# Patient Record
Sex: Female | Born: 1960 | Race: White | Hispanic: No | Marital: Single | Smoking: Never smoker
Health system: Southern US, Community
[De-identification: ages and names within clinical notes are randomized; demographics above are authoritative.]

## PROBLEM LIST (undated history)

## (undated) DIAGNOSIS — F319 Bipolar disorder, unspecified: Secondary | ICD-10-CM

## (undated) DIAGNOSIS — Z87442 Personal history of urinary calculi: Secondary | ICD-10-CM

## (undated) DIAGNOSIS — M549 Dorsalgia, unspecified: Secondary | ICD-10-CM

## (undated) DIAGNOSIS — F329 Major depressive disorder, single episode, unspecified: Secondary | ICD-10-CM

## (undated) DIAGNOSIS — T8859XA Other complications of anesthesia, initial encounter: Secondary | ICD-10-CM

## (undated) DIAGNOSIS — F419 Anxiety disorder, unspecified: Secondary | ICD-10-CM

## (undated) DIAGNOSIS — E7800 Pure hypercholesterolemia, unspecified: Secondary | ICD-10-CM

## (undated) DIAGNOSIS — F410 Panic disorder [episodic paroxysmal anxiety] without agoraphobia: Secondary | ICD-10-CM

## (undated) DIAGNOSIS — G479 Sleep disorder, unspecified: Secondary | ICD-10-CM

## (undated) DIAGNOSIS — J45909 Unspecified asthma, uncomplicated: Secondary | ICD-10-CM

## (undated) DIAGNOSIS — E739 Lactose intolerance, unspecified: Secondary | ICD-10-CM

## (undated) DIAGNOSIS — F32A Depression, unspecified: Secondary | ICD-10-CM

## (undated) DIAGNOSIS — I773 Arterial fibromuscular dysplasia: Secondary | ICD-10-CM

## (undated) DIAGNOSIS — R112 Nausea with vomiting, unspecified: Secondary | ICD-10-CM

## (undated) DIAGNOSIS — T4145XA Adverse effect of unspecified anesthetic, initial encounter: Secondary | ICD-10-CM

## (undated) DIAGNOSIS — M2550 Pain in unspecified joint: Secondary | ICD-10-CM

## (undated) DIAGNOSIS — Z9889 Other specified postprocedural states: Secondary | ICD-10-CM

## (undated) DIAGNOSIS — F909 Attention-deficit hyperactivity disorder, unspecified type: Secondary | ICD-10-CM

## (undated) HISTORY — DX: Pain in unspecified joint: M25.50

## (undated) HISTORY — DX: Anxiety disorder, unspecified: F41.9

## (undated) HISTORY — DX: Lactose intolerance, unspecified: E73.9

## (undated) HISTORY — DX: Pure hypercholesterolemia, unspecified: E78.00

## (undated) HISTORY — PX: LIPOSUCTION: SHX10

## (undated) HISTORY — DX: Dorsalgia, unspecified: M54.9

## (undated) HISTORY — PX: WISDOM TOOTH EXTRACTION: SHX21

---

## 2004-09-22 ENCOUNTER — Other Ambulatory Visit: Admission: RE | Admit: 2004-09-22 | Discharge: 2004-09-22 | Payer: Self-pay | Admitting: Obstetrics and Gynecology

## 2004-10-18 ENCOUNTER — Encounter: Admission: RE | Admit: 2004-10-18 | Discharge: 2004-10-18 | Payer: Self-pay | Admitting: Obstetrics and Gynecology

## 2013-04-25 DIAGNOSIS — G43909 Migraine, unspecified, not intractable, without status migrainosus: Secondary | ICD-10-CM | POA: Insufficient documentation

## 2013-04-25 DIAGNOSIS — G4730 Sleep apnea, unspecified: Secondary | ICD-10-CM | POA: Insufficient documentation

## 2013-07-09 DIAGNOSIS — F419 Anxiety disorder, unspecified: Secondary | ICD-10-CM | POA: Insufficient documentation

## 2013-09-22 ENCOUNTER — Ambulatory Visit (INDEPENDENT_AMBULATORY_CARE_PROVIDER_SITE_OTHER): Payer: Managed Care, Other (non HMO) | Admitting: Licensed Clinical Social Worker

## 2013-09-22 DIAGNOSIS — F331 Major depressive disorder, recurrent, moderate: Secondary | ICD-10-CM

## 2013-10-01 ENCOUNTER — Ambulatory Visit (INDEPENDENT_AMBULATORY_CARE_PROVIDER_SITE_OTHER): Payer: Managed Care, Other (non HMO) | Admitting: Licensed Clinical Social Worker

## 2013-10-01 DIAGNOSIS — F331 Major depressive disorder, recurrent, moderate: Secondary | ICD-10-CM

## 2013-10-08 ENCOUNTER — Ambulatory Visit (INDEPENDENT_AMBULATORY_CARE_PROVIDER_SITE_OTHER): Payer: Managed Care, Other (non HMO) | Admitting: Licensed Clinical Social Worker

## 2013-10-08 DIAGNOSIS — F331 Major depressive disorder, recurrent, moderate: Secondary | ICD-10-CM

## 2013-10-20 ENCOUNTER — Ambulatory Visit (INDEPENDENT_AMBULATORY_CARE_PROVIDER_SITE_OTHER): Payer: Managed Care, Other (non HMO) | Admitting: Licensed Clinical Social Worker

## 2013-10-20 DIAGNOSIS — F331 Major depressive disorder, recurrent, moderate: Secondary | ICD-10-CM

## 2013-11-03 ENCOUNTER — Ambulatory Visit (INDEPENDENT_AMBULATORY_CARE_PROVIDER_SITE_OTHER): Payer: Managed Care, Other (non HMO) | Admitting: Licensed Clinical Social Worker

## 2013-11-03 DIAGNOSIS — F331 Major depressive disorder, recurrent, moderate: Secondary | ICD-10-CM

## 2013-11-26 ENCOUNTER — Ambulatory Visit: Payer: Managed Care, Other (non HMO) | Admitting: Licensed Clinical Social Worker

## 2013-12-01 ENCOUNTER — Emergency Department (HOSPITAL_BASED_OUTPATIENT_CLINIC_OR_DEPARTMENT_OTHER): Payer: Managed Care, Other (non HMO)

## 2013-12-01 ENCOUNTER — Encounter (HOSPITAL_BASED_OUTPATIENT_CLINIC_OR_DEPARTMENT_OTHER): Payer: Self-pay | Admitting: Emergency Medicine

## 2013-12-01 ENCOUNTER — Emergency Department (HOSPITAL_BASED_OUTPATIENT_CLINIC_OR_DEPARTMENT_OTHER)
Admission: EM | Admit: 2013-12-01 | Discharge: 2013-12-01 | Disposition: A | Discharge status: Home / Self Care | Payer: Managed Care, Other (non HMO) | Attending: Emergency Medicine | Admitting: Emergency Medicine

## 2013-12-01 DIAGNOSIS — R29818 Other symptoms and signs involving the nervous system: Secondary | ICD-10-CM | POA: Insufficient documentation

## 2013-12-01 DIAGNOSIS — F419 Anxiety disorder, unspecified: Secondary | ICD-10-CM

## 2013-12-01 DIAGNOSIS — N390 Urinary tract infection, site not specified: Secondary | ICD-10-CM | POA: Insufficient documentation

## 2013-12-01 DIAGNOSIS — R0789 Other chest pain: Secondary | ICD-10-CM | POA: Insufficient documentation

## 2013-12-01 DIAGNOSIS — F411 Generalized anxiety disorder: Secondary | ICD-10-CM | POA: Insufficient documentation

## 2013-12-01 HISTORY — DX: Attention-deficit hyperactivity disorder, unspecified type: F90.9

## 2013-12-01 LAB — CBC WITH DIFFERENTIAL/PLATELET
Basophils Absolute: 0 10*3/uL (ref 0.0–0.1)
Basophils Relative: 1 % (ref 0–1)
Eosinophils Absolute: 0.2 10*3/uL (ref 0.0–0.7)
Eosinophils Relative: 3 % (ref 0–5)
HCT: 41.6 % (ref 36.0–46.0)
Hemoglobin: 13.7 g/dL (ref 12.0–15.0)
Lymphocytes Relative: 23 % (ref 12–46)
Lymphs Abs: 1.4 10*3/uL (ref 0.7–4.0)
MCH: 29.9 pg (ref 26.0–34.0)
MCHC: 32.9 g/dL (ref 30.0–36.0)
MCV: 90.8 fL (ref 78.0–100.0)
Monocytes Absolute: 0.6 10*3/uL (ref 0.1–1.0)
Monocytes Relative: 10 % (ref 3–12)
Neutro Abs: 3.9 10*3/uL (ref 1.7–7.7)
Neutrophils Relative %: 64 % (ref 43–77)
Platelets: 369 10*3/uL (ref 150–400)
RBC: 4.58 MIL/uL (ref 3.87–5.11)
RDW: 13.1 % (ref 11.5–15.5)
WBC: 6.2 10*3/uL (ref 4.0–10.5)

## 2013-12-01 LAB — COMPREHENSIVE METABOLIC PANEL
ALT: 24 U/L (ref 0–35)
AST: 30 U/L (ref 0–37)
Albumin: 4.3 g/dL (ref 3.5–5.2)
Alkaline Phosphatase: 116 U/L (ref 39–117)
BUN: 9 mg/dL (ref 6–23)
CO2: 29 mEq/L (ref 19–32)
Calcium: 9.7 mg/dL (ref 8.4–10.5)
Chloride: 103 mEq/L (ref 96–112)
Creatinine, Ser: 0.7 mg/dL (ref 0.50–1.10)
GFR calc Af Amer: >90 mL/min (ref >90)
GFR calc non Af Amer: >90 mL/min (ref >90)
Glucose, Bld: 98 mg/dL (ref 70–99)
Potassium: 4.6 mEq/L (ref 3.7–5.3)
Sodium: 142 mEq/L (ref 137–147)
Total Bilirubin: 0.4 mg/dL (ref 0.3–1.2)
Total Protein: 7.9 g/dL (ref 6.0–8.3)

## 2013-12-01 LAB — URINE MICROSCOPIC-ADD ON

## 2013-12-01 LAB — URINALYSIS, ROUTINE W REFLEX MICROSCOPIC
Bilirubin Urine: NEGATIVE
Glucose, UA: NEGATIVE mg/dL
Hgb urine dipstick: NEGATIVE
Ketones, ur: NEGATIVE mg/dL
Nitrite: NEGATIVE
Protein, ur: NEGATIVE mg/dL
Specific Gravity, Urine: 1.013 (ref 1.005–1.030)
Urobilinogen, UA: 0.2 mg/dL (ref 0.0–1.0)
pH: 7.5 (ref 5.0–8.0)

## 2013-12-01 LAB — TROPONIN I
Troponin I: <0.3 ng/mL (ref <0.30)
Troponin I: <0.3 ng/mL (ref <0.30)

## 2013-12-01 MED ORDER — GI COCKTAIL ~~LOC~~
30.0000 mL | Freq: Once | ORAL | Status: AC
  Administered 2013-12-01: 30 mL via ORAL
  Filled 2013-12-01: qty 30

## 2013-12-01 MED ORDER — SODIUM CHLORIDE 0.9 % IV BOLUS (SEPSIS)
500.0000 mL | Freq: Once | INTRAVENOUS | Status: AC
  Administered 2013-12-01: 500 mL via INTRAVENOUS

## 2013-12-01 MED ORDER — NITROFURANTOIN MONOHYD MACRO 100 MG PO CAPS: 100.0000 mg | ORAL_CAPSULE | Freq: Two times a day (BID) | ORAL | Status: DC

## 2013-12-01 NOTE — ED Notes (Signed)
Per EMS pt just got to work and felt a tightness to lower chest (bra strap area), no SOB, pt was hyperventilating, anxious; pt states felt like she was going to pass out; cbg 88, pt in no distress, denies chest pain, NSS, EKG NSR

## 2013-12-01 NOTE — ED Provider Notes (Signed)
CSN: [id number]     Arrival date & time 12/01/13  0459 History   First MD Initiated Contact with Patient 12/01/13 [phone]     Chief Complaint  Patient presents with  . Near Syncope   (Consider location/radiation/quality/duration/timing/severity/associated sxs/prior Treatment) Patient is a 53 y.o. female presenting with chest pain. The history is provided by the patient. No language interpreter was used.  Chest Pain Chest pain location: the entire chest anterior and posterior circumferentially, equal. Pain quality: tightness   Pain radiates to:  Does not radiate Pain radiates to the back: no   Pain severity:  Mild Onset quality:  Sudden Timing:  Constant Progression:  Unchanged Context comment:  Getting readying for work Relieved by:  Nothing Worsened by:  Nothing tried Ineffective treatments:  None tried Associated symptoms: no cough, no fever, no shortness of breath, no syncope, not vomiting and no weakness   Associated symptoms comment:  Dry mouth sweaty palms tightness circumferentially around head.  Hyperventilating as witnessed by EMS Risk factors: no birth control, no smoking and no surgery   No long car trips or plane trips no swelling or pain in the lower extremities.  States she has not had a panic attack since [place]it[place] Egle.    Past Medical History  Diagnosis Date  . Anxiety attack   . ADHD (attention deficit hyperactivity disorder)    Past Surgical History  Procedure Laterality Date  . Liposuction    . Wisdom tooth extraction     History reviewed. No pertinent family history. History  Substance Use Topics  . Smoking status: Never Smoker   . Smokeless tobacco: Not on file  . Alcohol Use: No   OB History   Grav Para Term Preterm Abortions TAB SAB Ect Mult Living                 Review of Systems  Constitutional: Negative for fever.  Respiratory: Positive for chest tightness. Negative for cough and shortness of breath.   Cardiovascular: Negative for chest pain and  syncope.  Gastrointestinal: Negative for vomiting.  Neurological: Negative for weakness.  All other systems reviewed and are negative.    Allergies  Review of patient's allergies indicates no known allergies.  Home Medications  No current outpatient prescriptions on file. BP 104/88  Pulse 90  Temp(Src) 97.7 F (36.5 C) (Oral)  Resp 16  Ht 5\' 4"  (1.626 m)  Wt 170 lb (77.111 kg)  BMI 29.17 kg/m2  SpO2 97% Physical Exam  Constitutional: She is oriented to person, place, and time. She appears well-developed and well-nourished.  HENT:  Head: Normocephalic and atraumatic.  Mouth/Throat: Oropharynx is clear and moist.  Eyes: Conjunctivae are normal. Pupils are equal, round, and reactive to light.  Neck: Normal range of motion. Neck supple.  Cardiovascular: Normal rate, regular rhythm and intact distal pulses.   Pulmonary/Chest: Effort normal and breath sounds normal. No respiratory distress. She has no wheezes. She has no rales. She exhibits no tenderness.  Abdominal: Soft. Bowel sounds are normal. There is no tenderness. There is no rebound and no guarding.  Musculoskeletal: Normal range of motion.  Neurological: She is alert and oriented to person, place, and time.  Skin: Skin is warm and dry. She is not diaphoretic.  Psychiatric: Her mood appears anxious.  Wringing hands    ED Course  Procedures (including critical care time) Labs Review Labs Reviewed  CBC WITH DIFFERENTIAL  TROPONIN I  COMPREHENSIVE METABOLIC PANEL  URINALYSIS, ROUTINE W REFLEX MICROSCOPIC   Imaging  Review No results found.  EKG Interpretation   None      Doubt PE no travel no swelling of the legs no surgery no exogenous estrogen MDM  No diagnosis found.  Date: 12/01/2013  Rate: 88  Rhythm: normal sinus rhythm  QRS Axis: normal  Intervals: normal  ST/T Wave abnormalities: normal  Conduction Disutrbances: none  Narrative Interpretation: unremarkable   Will obtain second set of troponins  at 8 am.  Do not think the issue is cardiac.  Symptons are consistent with panic attack.  Patient states she is reassured by the labs but now is concerned she may have shingles because of something the EMT said to her en route.  Follow up with your family doctor for shingles vaccine and ongoing care   Signed out to Dr. Maryan Rued pending troponin  Daeja Helderman K Khyleigh Furney-Rasch, MD 12/01/13 [phone]

## 2013-12-02 LAB — URINE CULTURE: Colony Count: 30000

## 2013-12-08 ENCOUNTER — Ambulatory Visit: Payer: Managed Care, Other (non HMO) | Admitting: Licensed Clinical Social Worker

## 2013-12-31 DIAGNOSIS — F319 Bipolar disorder, unspecified: Secondary | ICD-10-CM | POA: Insufficient documentation

## 2014-06-23 DIAGNOSIS — M8580 Other specified disorders of bone density and structure, unspecified site: Secondary | ICD-10-CM | POA: Insufficient documentation

## 2014-06-23 DIAGNOSIS — E669 Obesity, unspecified: Secondary | ICD-10-CM | POA: Insufficient documentation

## 2014-07-14 DIAGNOSIS — R339 Retention of urine, unspecified: Secondary | ICD-10-CM | POA: Insufficient documentation

## 2014-10-07 ENCOUNTER — Other Ambulatory Visit: Payer: Self-pay | Admitting: Gastroenterology

## 2015-01-06 ENCOUNTER — Encounter (HOSPITAL_COMMUNITY): Payer: Self-pay | Admitting: *Deleted

## 2015-01-06 ENCOUNTER — Other Ambulatory Visit: Payer: Self-pay | Admitting: Gastroenterology

## 2015-01-11 ENCOUNTER — Ambulatory Visit (HOSPITAL_COMMUNITY)
Admission: RE | Admit: 2015-01-11 | Discharge: 2015-01-11 | Disposition: A | Discharge status: Home / Self Care | Payer: Managed Care, Other (non HMO) | Source: Ambulatory Visit | Attending: Gastroenterology | Admitting: Gastroenterology

## 2015-01-11 ENCOUNTER — Encounter (HOSPITAL_COMMUNITY)
Admission: RE | Disposition: A | Discharge status: Home / Self Care | Payer: Self-pay | Source: Ambulatory Visit | Attending: Gastroenterology

## 2015-01-11 ENCOUNTER — Ambulatory Visit (HOSPITAL_COMMUNITY): Payer: Managed Care, Other (non HMO) | Admitting: Anesthesiology

## 2015-01-11 ENCOUNTER — Encounter (HOSPITAL_COMMUNITY): Payer: Self-pay | Admitting: Gastroenterology

## 2015-01-11 DIAGNOSIS — F909 Attention-deficit hyperactivity disorder, unspecified type: Secondary | ICD-10-CM | POA: Insufficient documentation

## 2015-01-11 DIAGNOSIS — Z1211 Encounter for screening for malignant neoplasm of colon: Secondary | ICD-10-CM | POA: Insufficient documentation

## 2015-01-11 DIAGNOSIS — J45909 Unspecified asthma, uncomplicated: Secondary | ICD-10-CM | POA: Insufficient documentation

## 2015-01-11 HISTORY — DX: Unspecified asthma, uncomplicated: J45.909

## 2015-01-11 HISTORY — PX: COLONOSCOPY WITH PROPOFOL: SHX5780

## 2015-01-11 SURGERY — COLONOSCOPY WITH PROPOFOL
Anesthesia: Monitor Anesthesia Care

## 2015-01-11 MED ORDER — PROPOFOL 10 MG/ML IV BOLUS
INTRAVENOUS | Status: AC
  Filled 2015-01-11: qty 20

## 2015-01-11 MED ORDER — GLYCOPYRROLATE 0.2 MG/ML IJ SOLN
INTRAMUSCULAR | Status: AC
  Filled 2015-01-11: qty 1

## 2015-01-11 MED ORDER — GLYCOPYRROLATE 0.2 MG/ML IJ SOLN
INTRAMUSCULAR | Status: DC | PRN
  Administered 2015-01-11 (×4): .05 mg via INTRAVENOUS

## 2015-01-11 MED ORDER — PROPOFOL INFUSION 10 MG/ML OPTIME
INTRAVENOUS | Status: DC | PRN
  Administered 2015-01-11: 200 ug/kg/min via INTRAVENOUS

## 2015-01-11 MED ORDER — SODIUM CHLORIDE 0.9 % IV SOLN: INTRAVENOUS | Status: DC

## 2015-01-11 MED ORDER — LACTATED RINGERS IV SOLN
INTRAVENOUS | Status: DC
  Administered 2015-01-11: 1000 mL via INTRAVENOUS

## 2015-01-11 MED ORDER — LIDOCAINE HCL (CARDIAC) 20 MG/ML IV SOLN
INTRAVENOUS | Status: AC
  Filled 2015-01-11: qty 5

## 2015-01-11 MED ORDER — PROPOFOL 10 MG/ML IV BOLUS
INTRAVENOUS | Status: DC | PRN
  Administered 2015-01-11 (×2): 40 mg via INTRAVENOUS
  Administered 2015-01-11: 30 mg via INTRAVENOUS
  Administered 2015-01-11: 40 mg via INTRAVENOUS
  Administered 2015-01-11: 20 mg via INTRAVENOUS

## 2015-01-11 MED ORDER — FENTANYL CITRATE 0.05 MG/ML IJ SOLN: 25.0000 ug | INTRAMUSCULAR | Status: DC | PRN

## 2015-01-11 SURGICAL SUPPLY — 22 items

## 2015-01-11 NOTE — Discharge Instructions (Signed)
Colonoscopy, Care After °These instructions give you information on caring for yourself after your procedure. Your doctor may also give you more specific instructions. Call your doctor if you have any problems or questions after your procedure. °HOME CARE °· Do not drive for 24 hours. °· Do not sign important papers or use machinery for 24 hours. °· You may shower. °· You may go back to your usual activities, but go slower for the first 24 hours. °· Take rest breaks often during the first 24 hours. °· Walk around or use warm packs on your belly (abdomen) if you have belly cramping or gas. °· Drink enough fluids to keep your pee (urine) clear or pale yellow. °· Resume your normal diet. Avoid heavy or fried foods. °· Avoid drinking alcohol for 24 hours or as told by your doctor. °· Only take medicines as told by your doctor. °If a tissue sample (biopsy) was taken during the procedure:  °· Do not take aspirin or blood thinners for 7 days, or as told by your doctor. °· Do not drink alcohol for 7 days, or as told by your doctor. °· Eat soft foods for the first 24 hours. °GET HELP IF: °You still have a small amount of blood in your poop (stool) 2-3 days after the procedure. °GET HELP RIGHT AWAY IF: °· You have more than a small amount of blood in your poop. °· You see clumps of tissue (blood clots) in your poop. °· Your belly is puffy (swollen). °· You feel sick to your stomach (nauseous) or throw up (vomit). °· You have a fever. °· You have belly pain that gets worse and medicine does not help. °MAKE SURE YOU: °· Understand these instructions. °· Will watch your condition. °· Will get help right away if you are not doing well or get worse. °Document Released: 12/08/2010 Document Revised: 11/10/2013 Document Reviewed: 07/13/2013 °ExitCare® Patient Information ©2015 [employer]. This information is not intended to replace advice given to you by your health care provider. Make sure you discuss any questions you have with  your health care provider. ° °

## 2015-01-11 NOTE — Anesthesia Preprocedure Evaluation (Addendum)
Anesthesia Evaluation  Patient identified by MRN, date of birth, ID band Patient awake    Reviewed: Allergy & Precautions, H&P , NPO status , Patient's Chart, lab work & pertinent test results  Airway Mallampati: II  TM Distance: >3 FB Neck ROM: Full    Dental no notable dental hx. (+) Teeth Intact, Dental Advisory Given   Pulmonary asthma ,  breath sounds clear to auscultation  Pulmonary exam normal       Cardiovascular negative cardio ROS  Rhythm:Regular Rate:Normal     Neuro/Psych Anxiety negative neurological ROS     GI/Hepatic negative GI ROS, Neg liver ROS,   Endo/Other  negative endocrine ROS  Renal/GU negative Renal ROS  negative genitourinary   Musculoskeletal   Abdominal   Peds  (+) ADHD Hematology negative hematology ROS (+)   Anesthesia Other Findings   Reproductive/Obstetrics negative OB ROS                            Anesthesia Physical Anesthesia Plan  ASA: II  Anesthesia Plan: MAC   Post-op Pain Management:    Induction: Intravenous  Airway Management Planned: Simple Face Mask  Additional Equipment:   Intra-op Plan:   Post-operative Plan:   Informed Consent: I have reviewed the patients History and Physical, chart, labs and discussed the procedure including the risks, benefits and alternatives for the proposed anesthesia with the patient or authorized representative who has indicated his/her understanding and acceptance.   Dental advisory given  Plan Discussed with: CRNA  Anesthesia Plan Comments:         Anesthesia Quick Evaluation

## 2015-01-11 NOTE — H&P (Signed)
  Procedure: Baseline screening colonoscopy. No family history of colon cancer.  History: The patient is a 54 year old female born 1961-06-07. She is scheduled to undergo her first screening colonoscopy with polypectomy to prevent colon cancer.  Medication allergies: None  Past medical history: Asthma. ADHD.  Exam: The patient is alert and lying comfortably on the endoscopy stretcher. Abdomen is soft and nontender to palpation. Lungs are clear to auscultation. Cardiac exam reveals a regular rhythm.  Plan: Proceed with baseline screening colonoscopy

## 2015-01-11 NOTE — Anesthesia Postprocedure Evaluation (Signed)
  Anesthesia Post-op Note  Patient: Tanya Peterson  Procedure(s) Performed: Procedure(s): COLONOSCOPY WITH PROPOFOL (N/A)  Patient Location: PACU  Anesthesia Type: MAC  Level of Consciousness: awake and alert   Airway and Oxygen Therapy: Patient Spontanous Breathing  Post-op Pain: none  Post-op Assessment: Post-op Vital signs reviewed, Patient's Cardiovascular Status Stable and Respiratory Function Stable  Post-op Vital Signs: Reviewed  Filed Vitals:   01/11/15 0830  BP: 119/50  Pulse: 84  Temp: 36.5 C  Resp: 15    Complications: No apparent anesthesia complications

## 2015-01-11 NOTE — Transfer of Care (Signed)
Immediate Anesthesia Transfer of Care Note  Patient: Tanya Peterson  Procedure(s) Performed: Procedure(s): COLONOSCOPY WITH PROPOFOL (N/A)  Patient Location: Endo recovery  Anesthesia Type:MAC  Level of Consciousness: Patient easily awoken, sedated, comfortable, cooperative, following commands, responds to stimulation.   Airway & Oxygen Therapy: Patient spontaneously breathing, ventilating well, oxygen via simple oxygen mask.  Post-op Assessment: Report given to PACU RN, vital signs reviewed and stable, moving all extremities.   Post vital signs: Reviewed and stable.  Complications: No apparent anesthesia complications

## 2015-01-11 NOTE — Op Note (Signed)
Procedure: Baseline screening colonoscopy  Endoscopist: Earle Gell  Premedication: Propofol administered by anesthesia  Procedure: The patient was placed in the left lateral decubitus position. Anal inspection and digital rectal exam were normal. The Pentax pediatric colonoscope was introduced into the rectum and advanced to the cecum. A normal-appearing appendiceal orifice was identified. A normal-appearing ileocecal valve was identified. Colonic preparation for the exam today was good. Withdrawal time was 14 minutes  Rectum. Normal. Retroflexed view of the distal rectum normal  Sigmoid colon and descending colon. Normal  Splenic flexure. Normal  Transverse colon. Normal  Hepatic flexure. Normal  Ascending colon. Normal  Cecum and ileocecal valve. Normal  Assessment: Normal baseline screening colonoscopy  Recommendation: Schedule repeat screening colonoscopy in 10 years

## 2015-01-12 ENCOUNTER — Encounter (HOSPITAL_COMMUNITY): Payer: Self-pay | Admitting: Gastroenterology

## 2015-05-10 DIAGNOSIS — J4520 Mild intermittent asthma, uncomplicated: Secondary | ICD-10-CM | POA: Insufficient documentation

## 2015-05-10 DIAGNOSIS — J4530 Mild persistent asthma, uncomplicated: Secondary | ICD-10-CM | POA: Insufficient documentation

## 2015-05-10 DIAGNOSIS — G4726 Circadian rhythm sleep disorder, shift work type: Secondary | ICD-10-CM | POA: Insufficient documentation

## 2015-08-29 ENCOUNTER — Encounter (HOSPITAL_BASED_OUTPATIENT_CLINIC_OR_DEPARTMENT_OTHER): Payer: Self-pay | Admitting: *Deleted

## 2015-08-29 ENCOUNTER — Emergency Department (HOSPITAL_BASED_OUTPATIENT_CLINIC_OR_DEPARTMENT_OTHER)
Admission: EM | Admit: 2015-08-29 | Discharge: 2015-08-29 | Disposition: A | Discharge status: Home / Self Care | Payer: Managed Care, Other (non HMO) | Attending: Emergency Medicine | Admitting: Emergency Medicine

## 2015-08-29 ENCOUNTER — Emergency Department (HOSPITAL_BASED_OUTPATIENT_CLINIC_OR_DEPARTMENT_OTHER): Payer: Managed Care, Other (non HMO)

## 2015-08-29 DIAGNOSIS — Z79899 Other long term (current) drug therapy: Secondary | ICD-10-CM | POA: Insufficient documentation

## 2015-08-29 DIAGNOSIS — R002 Palpitations: Secondary | ICD-10-CM | POA: Diagnosis not present

## 2015-08-29 DIAGNOSIS — R000 Tachycardia, unspecified: Secondary | ICD-10-CM | POA: Diagnosis present

## 2015-08-29 DIAGNOSIS — J45909 Unspecified asthma, uncomplicated: Secondary | ICD-10-CM | POA: Insufficient documentation

## 2015-08-29 DIAGNOSIS — R079 Chest pain, unspecified: Secondary | ICD-10-CM | POA: Insufficient documentation

## 2015-08-29 DIAGNOSIS — F909 Attention-deficit hyperactivity disorder, unspecified type: Secondary | ICD-10-CM | POA: Insufficient documentation

## 2015-08-29 DIAGNOSIS — Z7951 Long term (current) use of inhaled steroids: Secondary | ICD-10-CM | POA: Diagnosis not present

## 2015-08-29 DIAGNOSIS — F410 Panic disorder [episodic paroxysmal anxiety] without agoraphobia: Secondary | ICD-10-CM | POA: Insufficient documentation

## 2015-08-29 LAB — BASIC METABOLIC PANEL
Anion gap: 5 (ref 5–15)
BUN: 20 mg/dL (ref 6–20)
CO2: 26 mmol/L (ref 22–32)
CREATININE: 0.75 mg/dL (ref 0.44–1.00)
Calcium: 9.1 mg/dL (ref 8.9–10.3)
Chloride: 106 mmol/L (ref 101–111)
GFR calc Af Amer: >60 mL/min (ref >60)
GFR calc non Af Amer: >60 mL/min (ref >60)
GLUCOSE: 113 mg/dL — AB (ref 65–99)
Potassium: 3.5 mmol/L (ref 3.5–5.1)
Sodium: 137 mmol/L (ref 135–145)

## 2015-08-29 LAB — CBC WITH DIFFERENTIAL/PLATELET
Basophils Absolute: 0 10*3/uL (ref 0.0–0.1)
Basophils Relative: 1 %
EOS PCT: 3 %
Eosinophils Absolute: 0.2 10*3/uL (ref 0.0–0.7)
HEMATOCRIT: 42.4 % (ref 36.0–46.0)
Hemoglobin: 14.1 g/dL (ref 12.0–15.0)
LYMPHS PCT: 39 %
Lymphs Abs: 3.3 10*3/uL (ref 0.7–4.0)
MCH: 30.1 pg (ref 26.0–34.0)
MCHC: 33.3 g/dL (ref 30.0–36.0)
MCV: 90.4 fL (ref 78.0–100.0)
MONO ABS: 0.7 10*3/uL (ref 0.1–1.0)
MONOS PCT: 8 %
NEUTROS ABS: 4.3 10*3/uL (ref 1.7–7.7)
Neutrophils Relative %: 49 %
PLATELETS: 359 10*3/uL (ref 150–400)
RBC: 4.69 MIL/uL (ref 3.87–5.11)
RDW: 12.8 % (ref 11.5–15.5)
WBC: 8.5 10*3/uL (ref 4.0–10.5)

## 2015-08-29 LAB — D-DIMER, QUANTITATIVE: D-Dimer, Quant: 0.28 ug/mL-FEU (ref 0.00–0.48)

## 2015-08-29 LAB — TSH: TSH: 4.451 u[IU]/mL (ref 0.350–4.500)

## 2015-08-29 LAB — TROPONIN I
Troponin I: <0.03 ng/mL (ref <0.031)
Troponin I: <0.03 ng/mL (ref <0.031)

## 2015-08-29 MED ORDER — LORAZEPAM 2 MG/ML IJ SOLN
1.0000 mg | Freq: Once | INTRAMUSCULAR | Status: AC
  Administered 2015-08-29: 1 mg via INTRAVENOUS
  Filled 2015-08-29: qty 1

## 2015-08-29 MED ORDER — LORAZEPAM 1 MG PO TABS: 1.0000 mg | ORAL_TABLET | Freq: Three times a day (TID) | ORAL | Status: DC | PRN

## 2015-08-29 MED ORDER — SODIUM CHLORIDE 0.9 % IV BOLUS (SEPSIS)
1000.0000 mL | Freq: Once | INTRAVENOUS | Status: AC
  Administered 2015-08-29: 1000 mL via INTRAVENOUS

## 2015-08-29 NOTE — Discharge Instructions (Signed)
Panic Attacks Panic attacks are sudden, short-livedsurges of severe anxiety, fear, or discomfort. They may occur for no reason when you are relaxed, when you are anxious, or when you are sleeping. Panic attacks may occur for a number of reasons:   Healthy people occasionally have panic attacks in extreme, life-threatening situations, such as war or natural disasters. Normal anxiety is a protective mechanism of the body that helps [place] react to danger (fight or flight response).  Panic attacks are often seen with anxiety disorders, such as panic disorder, social anxiety disorder, generalized anxiety disorder, and phobias. Anxiety disorders cause excessive or uncontrollable anxiety. They may interfere with your relationships or other life activities.  Panic attacks are sometimes seen with other mental illnesses, such as depression and posttraumatic stress disorder.  Certain medical conditions, prescription medicines, and drugs of abuse can cause panic attacks. SYMPTOMS  Panic attacks start suddenly, peak within 20 minutes, and are accompanied by four or more of the following symptoms:  Pounding heart or fast heart rate (palpitations).  Sweating.  Trembling or shaking.  Shortness of breath or feeling smothered.  Feeling choked.  Chest pain or discomfort.  Nausea or strange feeling in your stomach.  Dizziness, light-headedness, or feeling like you will faint.  Chills or hot flushes.  Numbness or tingling in your lips or hands and feet.  Feeling that things are not real or feeling that you are not yourself.  Fear of losing control or going crazy.  Fear of dying. Some of these symptoms can mimic serious medical conditions. For example, you may think you are having a heart attack. Although panic attacks can be very scary, they are not life threatening. DIAGNOSIS  Panic attacks are diagnosed through an assessment by your health care provider. Your health care provider will ask  questions about your symptoms, such as where and when they occurred. Your health care provider will also ask about your medical history and use of alcohol and drugs, including prescription medicines. Your health care provider may order blood tests or other studies to rule out a serious medical condition. Your health care provider may refer you to a mental health professional for further evaluation. TREATMENT   Most healthy people who have one or two panic attacks in an extreme, life-threatening situation will not require treatment.  The treatment for panic attacks associated with anxiety disorders or other mental illness typically involves counseling with a mental health professional, medicine, or a combination of both. Your health care provider will help determine what treatment is best for you.  Panic attacks due to physical illness usually go away with treatment of the illness. If prescription medicine is causing panic attacks, talk with your health care provider about stopping the medicine, decreasing the dose, or substituting another medicine.  Panic attacks due to alcohol or drug abuse go away with abstinence. Some adults need professional help in order to stop drinking or using drugs. HOME CARE INSTRUCTIONS   Take all medicines as directed by your health care provider.   Schedule and attend follow-up visits as directed by your health care provider. It is important to keep all your appointments. SEEK MEDICAL CARE IF:  You are not able to take your medicines as prescribed.  Your symptoms do not improve or get worse. SEEK IMMEDIATE MEDICAL CARE IF:   You experience panic attack symptoms that are different than your usual symptoms.  You have serious thoughts about hurting yourself or others.  You are taking medicine for panic attacks and  have a serious side effect. MAKE SURE YOU:  Understand these instructions.  Will watch your condition.  Will get help right away if you are not  doing well or get worse.   This information is not intended to replace advice given to you by your health care provider. Make sure you discuss any questions you have with your health care provider.   Document Released: 11/05/2005 Document Revised: 11/10/2013 Document Reviewed: 06/19/2013 Elsevier Interactive Patient Education 2016 [employer]  Nonspecific Chest Pain  Chest pain can be caused by many different conditions. There is always a chance that your pain could be related to something serious, such as a heart attack or a blood clot in your lungs. Chest pain can also be caused by conditions that are not life-threatening. If you have chest pain, it is very important to follow up with your health care provider. CAUSES  Chest pain can be caused by:  Heartburn.  Pneumonia or bronchitis.  Anxiety or stress.  Inflammation around your heart (pericarditis) or lung (pleuritis or pleurisy).  A blood clot in your lung.  A collapsed lung (pneumothorax). It can develop suddenly on its own (spontaneous pneumothorax) or from trauma to the chest.  Shingles infection (varicella-zoster virus).  Heart attack.  Damage to the bones, muscles, and cartilage that make up your chest wall. This can include:  Bruised bones due to injury.  Strained muscles or cartilage due to frequent or repeated coughing or overwork.  Fracture to one or more ribs.  Sore cartilage due to inflammation (costochondritis). RISK FACTORS  Risk factors for chest pain may include:  Activities that increase your risk for trauma or injury to your chest.  Respiratory infections or conditions that cause frequent coughing.  Medical conditions or overeating that can cause heartburn.  Heart disease or family history of heart disease.  Conditions or health behaviors that increase your risk of developing a blood clot.  Having had chicken pox (varicella zoster). SIGNS AND SYMPTOMS Chest pain can feel like:  Burning or  tingling on the surface of your chest or deep in your chest.  Crushing, pressure, aching, or squeezing pain.  Dull or sharp pain that is worse when you move, cough, or take a deep breath.  Pain that is also felt in your back, neck, shoulder, or arm, or pain that spreads to any of these areas. Your chest pain may come and go, or it may stay constant. DIAGNOSIS Lab tests or other studies may be needed to find the cause of your pain. Your health care provider may have you take a test called an ambulatory ECG (electrocardiogram). An ECG records your heartbeat patterns at the time the test is performed. You may also have other tests, such as:  Transthoracic echocardiogram (TTE). During echocardiography, sound waves are used to create a picture of all of the heart structures and to look at how blood flows through your heart.  Transesophageal echocardiogram (TEE).This is a more advanced imaging test that obtains images from inside your body. It allows your health care provider to see your heart in finer detail.  Cardiac monitoring. This allows your health care provider to monitor your heart rate and rhythm in real time.  Holter monitor. This is a portable device that records your heartbeat and can help to diagnose abnormal heartbeats. It allows your health care provider to track your heart activity for several days, if needed.  Stress tests. These can be done through exercise or by taking medicine that makes your  heart beat more quickly.  Blood tests.  Imaging tests. TREATMENT  Your treatment depends on what is causing your chest pain. Treatment may include:  Medicines. These may include:  Acid blockers for heartburn.  Anti-inflammatory medicine.  Pain medicine for inflammatory conditions.  Antibiotic medicine, if an infection is present.  Medicines to dissolve blood clots.  Medicines to treat coronary artery disease.  Supportive care for conditions that do not require medicines.  This may include:  Resting.  Applying heat or cold packs to injured areas.  Limiting activities until pain decreases. HOME CARE INSTRUCTIONS  If you were prescribed an antibiotic medicine, finish it all even if you start to feel better.  Avoid any activities that bring on chest pain.  Do not use any tobacco products, including cigarettes, chewing tobacco, or electronic cigarettes. If you need help quitting, ask your health care provider.  Do not drink alcohol.  Take medicines only as directed by your health care provider.  Keep all follow-up visits as directed by your health care provider. This is important. This includes any further testing if your chest pain does not go away.  If heartburn is the cause for your chest pain, you may be told to keep your head raised (elevated) while sleeping. This reduces the chance that acid will go from your stomach into your esophagus.  Make lifestyle changes as directed by your health care provider. These may include:  Getting regular exercise. Ask your health care provider to suggest some activities that are safe for you.  Eating a heart-healthy diet. A registered dietitian can help you to learn healthy eating options.  Maintaining a healthy weight.  Managing diabetes, if necessary.  Reducing stress. SEEK MEDICAL CARE IF:  Your chest pain does not go away after treatment.  You have a rash with blisters on your chest.  You have a fever. SEEK IMMEDIATE MEDICAL CARE IF:   Your chest pain is worse.  You have an increasing cough, or you cough up blood.  You have severe abdominal pain.  You have severe weakness.  You faint.  You have chills.  You have sudden, unexplained chest discomfort.  You have sudden, unexplained discomfort in your arms, back, neck, or jaw.  You have shortness of breath at any time.  You suddenly start to sweat, or your skin gets clammy.  You feel nauseous or you vomit.  You suddenly feel  light-headed or dizzy.  Your heart begins to beat quickly, or it feels like it is skipping beats. These symptoms may represent a serious problem that is an emergency. Do not wait to see if the symptoms will go away. Get medical help right away. Call your local emergency services (911 in the [place] Do not drive yourself to the hospital.   This information is not intended to replace advice given to you by your health care provider. Make sure you discuss any questions you have with your health care provider.   Document Released: 08/15/2005 Document Revised: 11/26/2014 Document Reviewed: 06/11/2014 Elsevier Interactive Patient Education [employer].

## 2015-08-29 NOTE — ED Notes (Addendum)
C/o woke with heart racing, pounding in neck, chest tightness, sob and woozy, took an ativan at 0130 and again at 0230, severity has lessened, but still feels like heart is racing. HR 102 at this time. Alert, NAD, anxious, steady gait, no dyspnea noted, skin W&D. Took adderall yesterday at 1330. Pt of Dr. [facility] [place].

## 2015-08-29 NOTE — ED Provider Notes (Signed)
TIME SEEN: 3:00 AM  CHIEF COMPLAINT: Palpitations, chest tightness  HPI: Pt is a 54 y.o. female with history of panic attacks, ADHD who presents to the emergency department with palpitations and chest tightness that started around 1:30 AM while lying in bed. States she just laid down when symptoms started. Has had similar symptoms before associated with panic attack that normally resolves with Ativan. Took 2 tablets of 0.5 mg of Ativan prior to arrival without much relief. States she felt she was "gasping for breath". She did have some nausea that is gone and dizziness with standing. States she took her heart rate in it was in the 90s and regular. States her heart rate is normal in the 70s so this concerned her. Denies any history of hypertension, diabetes, hyperlipidemia, tobacco use. No family history of premature CAD. States she had a stress test in the summer of 2015 which was normal. No history of PE or DVT, recent prolonged immobilization such as long flight, hospitalization, fracture, surgery, trauma. Not on exogenous estrogen. tenderness or swelling. No fever or cough. No change in her dose of Adderall recently. States she did not drink as much caffeine as she normally does yesterday. No other stimulant use.  ROS: See HPI Constitutional: no fever  Eyes: no drainage  ENT: no runny nose   Cardiovascular:  chest pain  Resp: no SOB  GI: no vomiting GU: no dysuria Integumentary: no rash  Allergy: no hives  Musculoskeletal: no leg swelling  Neurological: no slurred speech ROS otherwise negative  PAST MEDICAL HISTORY/PAST SURGICAL HISTORY:  Past Medical History  Diagnosis Date  . Anxiety attack   . ADHD (attention deficit hyperactivity disorder)   . Asthma     MEDICATIONS:  Prior to Admission medications   Medication Sig Start Date End Date Taking? Authorizing Provider  albuterol (PROVENTIL HFA;VENTOLIN HFA) 108 (90 BASE) MCG/ACT inhaler Inhale 1 puff into the lungs every 30 (thirty)  minutes as needed for wheezing or shortness of breath.    Historical Provider, MD  amphetamine-dextroamphetamine (ADDERALL XR) 10 MG 24 hr capsule Take 10 mg by mouth daily.    Historical Provider, MD  Cholecalciferol (VITAMIN D3) 5000 UNITS TABS Take 2 tablets by mouth daily.    Historical Provider, MD  Fluticasone-Salmeterol (ADVAIR) 250-50 MCG/DOSE AEPB Inhale 1 puff into the lungs daily.    Historical Provider, MD  nitrofurantoin, macrocrystal-monohydrate, (MACROBID) 100 MG capsule Take 1 capsule (100 mg total) by mouth 2 (two) times daily. Patient not taking: Reported on 01/11/2015 12/01/13   April Palumbo, MD    ALLERGIES:  No Known Allergies  SOCIAL HISTORY:  Social History  Substance Use Topics  . Smoking status: Never Smoker   . Smokeless tobacco: Not on file  . Alcohol Use: No    FAMILY HISTORY: History reviewed. No pertinent family history.  EXAM: BP 131/54 mmHg  Pulse 102  Temp(Src) 98.2 F (36.8 C) (Oral)  Resp 16  Ht 5\' 4"  (1.626 m)  Wt 175 lb (79.379 kg)  BMI 30.02 kg/m2  SpO2 99% CONSTITUTIONAL: Alert and oriented and responds appropriately to questions. Well-appearing; well-nourished HEAD: Normocephalic EYES: Conjunctivae clear, PERRL ENT: normal nose; no rhinorrhea; moist mucous membranes; pharynx without lesions noted NECK: Supple, no meningismus, no LAD  CARD: RRR; S1 and S2 appreciated; no murmurs, no clicks, no rubs, no gallops RESP: Normal chest excursion without splinting or tachypnea; breath sounds clear and equal bilaterally; no wheezes, no rhonchi, no rales, no hypoxia or respiratory distress, speaking full sentences  ABD/GI: Normal bowel sounds; non-distended; soft, non-tender, no rebound, no guarding, no peritoneal signs BACK:  The back appears normal and is non-tender to palpation, there is no CVA tenderness EXT: Normal ROM in all joints; non-tender to palpation; no edema; normal capillary refill; no cyanosis, no calf tenderness or swelling     SKIN: Normal color for age and race; warm NEURO: Moves all extremities equally, sensation to light touch intact diffusely, cranial nerves II through XII intact PSYCH: Patient appears anxious and has rapid speech. Normal insight and thought process however. Grooming and personal hygiene are appropriate.  MEDICAL DECISION MAKING: Patient here with palpitations and chest tightness. She appears anxious on exam and states she has similar symptoms with panic attacks. No risk factors for ACS or pulmonary embolus other than age. EKG shows no new ischemic changes and is unchanged compared to prior EKG in January 2015. Last obtain cardiac labs, chest x-ray, d-dimer.  Differential diagnosis includes ACS, PE, pneumonia, pulmonary edema, dissection, panic attack.  ED PROGRESS: 4:15 AM  Patient's labs are unremarkable. Negative troponin and negative d-dimer. Chest x-ray is clear. Patient's symptoms have completely resolved after IV Ativan. Will repeat second troponin at 6 AM. Patient is resting comfortably.   6:45 AM  Pt's second troponin is negative. She is chest pain-free and has no more palpitations although her heart rate is still in the 90s. Discussed with patient that I'm very reassured by her exam, vital signs and lab work. I feel that this was a panic attack. Have recommended follow-up with her outpatient provider. Patient states that her Ativan prescription is old and feels that that may be the reason it did not work. We'll give her another prescription for Ativan to take as needed for anxiety. Discussed return precautions. She verbalized understanding and is comfortable with plan.     EKG Interpretation  Date/Time:  Monday August 29 2015 02:50:39 EDT Ventricular Rate:  101 PR Interval:  140 QRS Duration: 80 QT Interval:  352 QTC Calculation: 456 R Axis:   66 Text Interpretation:  Sinus tachycardia Cannot rule out Anterior infarct , age undetermined Abnormal ECG No significant change since last  tracing Jan 2015 other than rate is faster Confirmed by WARD,  DO, KRISTEN [phone]) on 08/29/2015 2:59:11 AM         Linton Hall, DO 08/29/15 [phone]

## 2015-08-29 NOTE — ED Notes (Signed)
Dr. Leonides Schanz into room

## 2015-08-29 NOTE — ED Notes (Signed)
Alert, NAD, calm, interactive, pending xray, IVF infusing. VSS.

## 2015-08-29 NOTE — ED Notes (Signed)
Resting/ sleeping, arousable to voice, NAD, calm, interactive, VSS, "feel better", denies sx.

## 2015-12-21 HISTORY — PX: OTHER SURGICAL HISTORY: SHX169

## 2016-02-15 ENCOUNTER — Ambulatory Visit (INDEPENDENT_AMBULATORY_CARE_PROVIDER_SITE_OTHER): Payer: Managed Care, Other (non HMO) | Admitting: Podiatry

## 2016-02-15 ENCOUNTER — Encounter: Payer: Self-pay | Admitting: Podiatry

## 2016-02-15 ENCOUNTER — Ambulatory Visit (HOSPITAL_BASED_OUTPATIENT_CLINIC_OR_DEPARTMENT_OTHER): Admission: RE | Admit: 2016-02-15 | Payer: Managed Care, Other (non HMO) | Source: Ambulatory Visit

## 2016-02-15 VITALS — BP 117/76 | HR 96 | Resp 18

## 2016-02-15 DIAGNOSIS — M722 Plantar fascial fibromatosis: Secondary | ICD-10-CM

## 2016-02-15 DIAGNOSIS — M7732 Calcaneal spur, left foot: Secondary | ICD-10-CM | POA: Diagnosis not present

## 2016-02-15 DIAGNOSIS — M7662 Achilles tendinitis, left leg: Secondary | ICD-10-CM

## 2016-02-15 DIAGNOSIS — M79672 Pain in left foot: Secondary | ICD-10-CM

## 2016-02-15 MED ORDER — DICLOFENAC SODIUM 75 MG PO TBEC: 75.0000 mg | DELAYED_RELEASE_TABLET | Freq: Two times a day (BID) | ORAL | Status: DC

## 2016-02-15 NOTE — Progress Notes (Addendum)
   Subjective:    Patient ID: Tanya Peterson, female    DOB: 1961-10-20, 55 y.o.   MRN: [id number]  HPI  55 year old female presents the office today for concerns of left heel pain to the back of her heel. She has has a small bump on the bottom of her left foot. She states that the symptoms started approximately November 2016. She states the symptoms of worsening she has difficulty wearing shoes due to a bone spur on the back of her left heel. She states it is not her with pressure when she wears shoes and his lateral walking at work she gets increased discomfort. She states that by wrapping Ace bandage around the foot that she has decreased pain and she can almost walk all day. She denies any recent injury or trauma. This been no swelling or redness. No tingling or numbness. No other treatment. No other complaints at this time.   Review of Systems  All other systems reviewed and are negative.      Objective:   Physical Exam General: AAO x3, NAD  Dermatological: Skin is warm, dry and supple bilateral. Nails x 10 are well manicured; remaining integument appears unremarkable at this time. There are no open sores, no preulcerative lesions, no rash or signs of infection present.  Vascular: Dorsalis Pedis artery and Posterior Tibial artery pedal pulses are 2/4 bilateral with immedate capillary fill time. Pedal hair growth present. No varicosities and no lower extremity edema present bilateral. There is no pain with calf compression, swelling, warmth, erythema.   Neruologic: Grossly intact via light touch bilateral. Vibratory intact via tuning fork bilateral. Protective threshold with Semmes Wienstein monofilament intact to all pedal sites bilateral. Patellar and Achilles deep tendon reflexes 2+ bilateral. No Babinski or clonus noted bilateral.   Musculoskeletal: There is a prominent retrocalcaneal exostosis posterior left heel. There is currently no tenderness overlying this area or to the Achilles  tendon. There is no defect noted within the Achilles tendon [place] Silos test is negative. There is slight irritation overlying this area from shoe gear. There is a small firm nonmobile soft tissue mass present in the medial band of the plantar fascia within the arch of the foot on the left side. There is no overlying skin change.  Gait: Unassisted, Nonantalgic.      Assessment & Plan:  55 year old female left posterior retrocalcaneal exostosis, plantar fibroma -Treatment options discussed including all alternatives, risks, and complications -X-rays were ordered today. -Etiology of symptoms were discussed -Prescribed mobic. Discussed side effects of the medication and directed to stop if any are to occur and call the office.  -Night splint dispensed -Ice to the area -Achilles tendon sleeve dispensed. -Discussed treatment options for plantar fibroma. Discussed injection versus compound cream. She'll start with compound cream. The sizing the area daily. -Follow-up in 4 weeks or sooner if any problems arise. In the meantime, encouraged to call the office with any questions, concerns, change in symptoms.   Celesta Gentile, DPM  *Patient ended up declining x-rays and did not get them done.

## 2016-02-15 NOTE — Patient Instructions (Signed)

## 2016-02-17 ENCOUNTER — Encounter (HOSPITAL_COMMUNITY): Payer: Self-pay | Admitting: *Deleted

## 2016-02-17 ENCOUNTER — Telehealth: Payer: Self-pay | Admitting: *Deleted

## 2016-02-21 ENCOUNTER — Other Ambulatory Visit: Payer: Self-pay | Admitting: Obstetrics and Gynecology

## 2016-02-27 MED ORDER — NONFORMULARY OR COMPOUNDED ITEM: Status: DC

## 2016-02-27 NOTE — Telephone Encounter (Signed)
Dr. Jacqualyn Posey ordered [place] Plantar Fasciitis and Neuroma cream.  Faxed.

## 2016-03-05 ENCOUNTER — Ambulatory Visit (HOSPITAL_COMMUNITY): Payer: Managed Care, Other (non HMO) | Admitting: Anesthesiology

## 2016-03-05 ENCOUNTER — Encounter (HOSPITAL_COMMUNITY)
Admission: RE | Disposition: A | Discharge status: Home / Self Care | Payer: Self-pay | Source: Ambulatory Visit | Attending: Obstetrics and Gynecology

## 2016-03-05 ENCOUNTER — Encounter (HOSPITAL_COMMUNITY): Payer: Self-pay | Admitting: Emergency Medicine

## 2016-03-05 ENCOUNTER — Ambulatory Visit (HOSPITAL_COMMUNITY)
Admission: RE | Admit: 2016-03-05 | Discharge: 2016-03-05 | Disposition: A | Discharge status: Home / Self Care | Payer: Managed Care, Other (non HMO) | Source: Ambulatory Visit | Attending: Obstetrics and Gynecology | Admitting: Obstetrics and Gynecology

## 2016-03-05 DIAGNOSIS — Z79899 Other long term (current) drug therapy: Secondary | ICD-10-CM | POA: Diagnosis not present

## 2016-03-05 DIAGNOSIS — Z87442 Personal history of urinary calculi: Secondary | ICD-10-CM | POA: Diagnosis not present

## 2016-03-05 DIAGNOSIS — R938 Abnormal findings on diagnostic imaging of other specified body structures: Secondary | ICD-10-CM | POA: Diagnosis present

## 2016-03-05 DIAGNOSIS — F410 Panic disorder [episodic paroxysmal anxiety] without agoraphobia: Secondary | ICD-10-CM | POA: Diagnosis not present

## 2016-03-05 DIAGNOSIS — N840 Polyp of corpus uteri: Secondary | ICD-10-CM | POA: Insufficient documentation

## 2016-03-05 DIAGNOSIS — F909 Attention-deficit hyperactivity disorder, unspecified type: Secondary | ICD-10-CM | POA: Insufficient documentation

## 2016-03-05 DIAGNOSIS — J45909 Unspecified asthma, uncomplicated: Secondary | ICD-10-CM | POA: Diagnosis not present

## 2016-03-05 DIAGNOSIS — F329 Major depressive disorder, single episode, unspecified: Secondary | ICD-10-CM | POA: Diagnosis not present

## 2016-03-05 DIAGNOSIS — F419 Anxiety disorder, unspecified: Secondary | ICD-10-CM | POA: Insufficient documentation

## 2016-03-05 HISTORY — DX: Panic disorder (episodic paroxysmal anxiety): F41.0

## 2016-03-05 HISTORY — DX: Major depressive disorder, single episode, unspecified: F32.9

## 2016-03-05 HISTORY — DX: Depression, unspecified: F32.A

## 2016-03-05 HISTORY — PX: HYSTEROSCOPY WITH D & C: SHX1775

## 2016-03-05 HISTORY — DX: Personal history of urinary calculi: Z87.442

## 2016-03-05 HISTORY — DX: Sleep disorder, unspecified: G47.9

## 2016-03-05 LAB — BASIC METABOLIC PANEL
Anion gap: 6 (ref 5–15)
BUN: 13 mg/dL (ref 6–20)
CALCIUM: 9.8 mg/dL (ref 8.9–10.3)
CO2: 28 mmol/L (ref 22–32)
CREATININE: 0.75 mg/dL (ref 0.44–1.00)
Chloride: 107 mmol/L (ref 101–111)
GFR calc non Af Amer: >60 mL/min (ref >60)
Glucose, Bld: 88 mg/dL (ref 65–99)
Potassium: 3.8 mmol/L (ref 3.5–5.1)
SODIUM: 141 mmol/L (ref 135–145)

## 2016-03-05 LAB — CBC
HCT: 42.1 % (ref 36.0–46.0)
Hemoglobin: 14.1 g/dL (ref 12.0–15.0)
MCH: 31.1 pg (ref 26.0–34.0)
MCHC: 33.5 g/dL (ref 30.0–36.0)
MCV: 92.7 fL (ref 78.0–100.0)
PLATELETS: 387 10*3/uL (ref 150–400)
RBC: 4.54 MIL/uL (ref 3.87–5.11)
RDW: 13.1 % (ref 11.5–15.5)
WBC: 7.1 10*3/uL (ref 4.0–10.5)

## 2016-03-05 SURGERY — DILATATION AND CURETTAGE /HYSTEROSCOPY
Anesthesia: General | Site: Vagina

## 2016-03-05 MED ORDER — PROPOFOL 10 MG/ML IV BOLUS
INTRAVENOUS | Status: AC
  Filled 2016-03-05: qty 20

## 2016-03-05 MED ORDER — MIDAZOLAM HCL 2 MG/2ML IJ SOLN
INTRAMUSCULAR | Status: AC
  Filled 2016-03-05: qty 2

## 2016-03-05 MED ORDER — PROPOFOL 10 MG/ML IV BOLUS
INTRAVENOUS | Status: DC | PRN
  Administered 2016-03-05: 150 mg via INTRAVENOUS

## 2016-03-05 MED ORDER — FENTANYL CITRATE (PF) 250 MCG/5ML IJ SOLN
INTRAMUSCULAR | Status: AC
  Filled 2016-03-05: qty 5

## 2016-03-05 MED ORDER — LORAZEPAM 2 MG/ML IJ SOLN: 0.5000 mg | Freq: Once | INTRAMUSCULAR | Status: DC | PRN

## 2016-03-05 MED ORDER — SCOPOLAMINE 1 MG/3DAYS TD PT72
1.0000 | MEDICATED_PATCH | Freq: Once | TRANSDERMAL | Status: DC
  Administered 2016-03-05: 1.5 mg via TRANSDERMAL

## 2016-03-05 MED ORDER — FENTANYL CITRATE (PF) 100 MCG/2ML IJ SOLN
INTRAMUSCULAR | Status: DC | PRN
  Administered 2016-03-05: 100 ug via INTRAVENOUS

## 2016-03-05 MED ORDER — IBUPROFEN 600 MG PO TABS: 600.0000 mg | ORAL_TABLET | Freq: Four times a day (QID) | ORAL | Status: DC | PRN

## 2016-03-05 MED ORDER — ONDANSETRON HCL 4 MG/2ML IJ SOLN: 4.0000 mg | Freq: Once | INTRAMUSCULAR | Status: DC | PRN

## 2016-03-05 MED ORDER — LIDOCAINE HCL (CARDIAC) 20 MG/ML IV SOLN
INTRAVENOUS | Status: DC | PRN
  Administered 2016-03-05: 80 mg via INTRAVENOUS

## 2016-03-05 MED ORDER — ONDANSETRON HCL 4 MG/2ML IJ SOLN
INTRAMUSCULAR | Status: AC
  Filled 2016-03-05: qty 2

## 2016-03-05 MED ORDER — LACTATED RINGERS IV SOLN
INTRAVENOUS | Status: DC
Start: 2016-03-05 — End: 2016-03-05
  Administered 2016-03-05 (×2): via INTRAVENOUS

## 2016-03-05 MED ORDER — SODIUM CHLORIDE 0.9 % IR SOLN
Status: DC | PRN
  Administered 2016-03-05: 3000 mL

## 2016-03-05 MED ORDER — LIDOCAINE HCL (CARDIAC) 20 MG/ML IV SOLN
INTRAVENOUS | Status: AC
  Filled 2016-03-05: qty 5

## 2016-03-05 MED ORDER — LIDOCAINE HCL 1 % IJ SOLN
INTRAMUSCULAR | Status: AC
  Filled 2016-03-05: qty 20

## 2016-03-05 MED ORDER — DEXAMETHASONE SODIUM PHOSPHATE 10 MG/ML IJ SOLN
INTRAMUSCULAR | Status: DC | PRN
  Administered 2016-03-05: 4 mg via INTRAVENOUS

## 2016-03-05 MED ORDER — FENTANYL CITRATE (PF) 100 MCG/2ML IJ SOLN
25.0000 ug | INTRAMUSCULAR | Status: DC | PRN
  Administered 2016-03-05: 50 ug via INTRAVENOUS

## 2016-03-05 MED ORDER — KETOROLAC TROMETHAMINE 30 MG/ML IJ SOLN
INTRAMUSCULAR | Status: DC | PRN
Start: 2016-03-05 — End: 2016-03-05
  Administered 2016-03-05: 30 mg via INTRAVENOUS

## 2016-03-05 MED ORDER — KETOROLAC TROMETHAMINE 30 MG/ML IJ SOLN
INTRAMUSCULAR | Status: AC
  Filled 2016-03-05: qty 1

## 2016-03-05 MED ORDER — FENTANYL CITRATE (PF) 100 MCG/2ML IJ SOLN
INTRAMUSCULAR | Status: DC
Start: 2016-03-05 — End: 2016-03-05
  Filled 2016-03-05: qty 2

## 2016-03-05 MED ORDER — SCOPOLAMINE 1 MG/3DAYS TD PT72
MEDICATED_PATCH | TRANSDERMAL | Status: AC
  Administered 2016-03-05: 1.5 mg via TRANSDERMAL
  Filled 2016-03-05: qty 1

## 2016-03-05 MED ORDER — ONDANSETRON HCL 4 MG/2ML IJ SOLN
INTRAMUSCULAR | Status: DC | PRN
  Administered 2016-03-05: 4 mg via INTRAVENOUS

## 2016-03-05 MED ORDER — LACTATED RINGERS IV SOLN: INTRAVENOUS | Status: DC

## 2016-03-05 MED ORDER — DEXAMETHASONE SODIUM PHOSPHATE 4 MG/ML IJ SOLN
INTRAMUSCULAR | Status: AC
  Filled 2016-03-05: qty 1

## 2016-03-05 MED ORDER — MIDAZOLAM HCL 2 MG/2ML IJ SOLN
INTRAMUSCULAR | Status: DC | PRN
Start: 2016-03-05 — End: 2016-03-05
  Administered 2016-03-05: 2 mg via INTRAVENOUS

## 2016-03-05 MED ORDER — LIDOCAINE HCL 1 % IJ SOLN
INTRAMUSCULAR | Status: DC | PRN
  Administered 2016-03-05: 10 mL

## 2016-03-05 MED ORDER — OXYCODONE-ACETAMINOPHEN 5-325 MG PO TABS: 1.0000 | ORAL_TABLET | ORAL | Status: DC | PRN

## 2016-03-05 SURGICAL SUPPLY — 18 items
ABLATOR ENDOMETRIAL BIPOLAR (ABLATOR) IMPLANT
CANISTER SUCT 3000ML (MISCELLANEOUS) ×3 IMPLANT
CATH ROBINSON RED A/P 16FR (CATHETERS) ×3 IMPLANT
CLOTH BEACON ORANGE TIMEOUT ST (SAFETY) ×3 IMPLANT
CONTAINER PREFILL 10% NBF 60ML (FORM) ×4 IMPLANT
DILATOR CANAL MILEX (MISCELLANEOUS) IMPLANT
ELECT REM PT RETURN 9FT ADLT (ELECTROSURGICAL)
ELECTRODE REM PT RTRN 9FT ADLT (ELECTROSURGICAL) IMPLANT
GLOVE BIO SURGEON STRL SZ7 (GLOVE) ×3 IMPLANT
GLOVE BIOGEL PI IND STRL 7.0 (GLOVE) ×1 IMPLANT
GLOVE BIOGEL PI INDICATOR 7.0 (GLOVE) ×2
GOWN STRL REUS W/TWL LRG LVL3 (GOWN DISPOSABLE) ×6 IMPLANT
PACK VAGINAL MINOR WOMEN LF (CUSTOM PROCEDURE TRAY) ×3 IMPLANT
PAD OB MATERNITY 4.3X12.25 (PERSONAL CARE ITEMS) ×3 IMPLANT
TOWEL OR 17X24 6PK STRL BLUE (TOWEL DISPOSABLE) ×6 IMPLANT
TUBING AQUILEX INFLOW (TUBING) ×3 IMPLANT
TUBING AQUILEX OUTFLOW (TUBING) ×3 IMPLANT
WATER STERILE IRR 1000ML POUR (IV SOLUTION) ×3 IMPLANT

## 2016-03-05 NOTE — Discharge Instructions (Signed)

## 2016-03-05 NOTE — H&P (Signed)
Tanya Peterson is an 55 y.o. female.  Pt presents for hysteroscopy, dilation and curettage for thickened endometrium. During her annual exam she was noted to have a cervical polyp that was felt to have an attachment higher in the endometrial cavity. An US showed a thickened endometrium of 2 cm. SIS could not distinguish whether this was a polyp/fibroid or global thickening. Endometrial bx was not conclusive. Therefore the decision was made to proceed with hysteroscopy, Dilation and curettage. R/B/A reviewed and the patient wishes to proceed   No LMP recorded. Patient is postmenopausal.    Past Medical History  Diagnosis Date  . ADHD (attention deficit hyperactivity disorder)   . Asthma     rarely uses inhaler  . Depression   . History of kidney stones   . Sleep disorder     tx with nuvigal  . Anxiety attack   . Panic attacks     Past Surgical History  Procedure Laterality Date  . Liposuction    . Wisdom tooth extraction    . Colonoscopy with propofol N/A 01/11/2015    Procedure: COLONOSCOPY WITH PROPOFOL;  Surgeon: Garlan Fair, MD;  Location: [facility];  Service: Endoscopy;  Laterality: N/A;  . [place] stone surgery  12/2015    History reviewed. No pertinent family history.  Social History:  reports that she has never smoked. She has never used smokeless tobacco. She reports that she does not drink alcohol or use illicit drugs.  Allergies: No Known Allergies  Prescriptions prior to admission  Medication Sig Dispense Refill Last Dose  . amphetamine-dextroamphetamine (ADDERALL XR) 10 MG 24 hr capsule Take 10 mg by mouth daily.   Past Week at Unknown time  . Armodafinil (NUVIGIL PO) Take by mouth 3 (three) times a week.   Past Week at Unknown time  . Armodafinil (NUVIGIL) 250 MG tablet Take 250 mg by mouth daily.     Marland Kitchen LORazepam (ATIVAN) 1 MG tablet Take 1 tablet (1 mg total) by mouth 3 (three) times daily as needed for anxiety. 15 tablet 0 Past Week at Unknown time  .  albuterol (PROVENTIL HFA;VENTOLIN HFA) 108 (90 BASE) MCG/ACT inhaler Inhale 1 puff into the lungs every 30 (thirty) minutes as needed for wheezing or shortness of breath.   More than a month at Unknown time  . Cholecalciferol (VITAMIN D3) 5000 UNITS TABS Take 2 tablets by mouth daily.   01/10/2015 at Unknown time  . diclofenac (VOLTAREN) 75 MG EC tablet Take 1 tablet (75 mg total) by mouth 2 (two) times daily. 30 tablet 0   . Fluticasone-Salmeterol (ADVAIR) 250-50 MCG/DOSE AEPB Inhale 1 puff into the lungs daily.   More than a month at Unknown time  . nitrofurantoin, macrocrystal-monohydrate, (MACROBID) 100 MG capsule Take 1 capsule (100 mg total) by mouth 2 (two) times daily. (Patient not taking: Reported on 01/11/2015) 14 capsule 0 Not Taking at Unknown time  . NONFORMULARY OR COMPOUNDED [place] compound:  Plantar fasciitis and Neuromas cream - Baclofen 2%, Gabapentin 5%, Ketorolac 7.5%, Aripiprazole 0.5%, Lidocaine 2.5%, Verapamil 10%, dispense 240 grams, apply 1-2 pumps to affected area 3-4 times daily and rub in 1-2 minutes.  +11refills. 240 each 11     ROS  Blood pressure 127/83, pulse 90, temperature 98.3 F (36.8 C), temperature source Oral, resp. rate 16, height 5\' 5"  (1.651 m), weight 79.379 kg (175 lb), SpO2 100 %. Physical Exam   AOX3, NAD Abd soft Normal work of breathing  Results for orders placed  or performed during the hospital encounter of 03/05/16 (from the past 24 hour(s))  CBC     Status: None   Collection Time: 03/05/16 11:27 AM  Result Value Ref Range   WBC 7.1 4.0 - 10.5 K/uL   RBC 4.54 3.87 - 5.11 MIL/uL   Hemoglobin 14.1 12.0 - 15.0 g/dL   HCT 42.1 36.0 - 46.0 %   MCV 92.7 78.0 - 100.0 fL   MCH 31.1 26.0 - 34.0 pg   MCHC 33.5 30.0 - 36.0 g/dL   RDW 13.1 11.5 - 15.5 %   Platelets 387 150 - 400 K/uL    No results found.  Assessment/Plan: 1) Admit 2) Proceed with hysteroscopy, D&C  Jaston Havens H. 03/05/2016, 11:55 AM

## 2016-03-05 NOTE — Anesthesia Preprocedure Evaluation (Addendum)
Anesthesia Evaluation  Patient identified by MRN, date of birth, ID band Patient awake    Reviewed: Allergy & Precautions, H&P , NPO status , Patient's Chart, lab work & pertinent test results  Airway Mallampati: I  TM Distance: >3 FB Neck ROM: Full    Dental no notable dental hx. (+) Teeth Intact, Dental Advisory Given   Pulmonary asthma ,    Pulmonary exam normal        Cardiovascular negative cardio ROS Normal cardiovascular exam     Neuro/Psych PSYCHIATRIC DISORDERS Anxiety Depression Panic and anxiety attacksnegative neurological ROS     GI/Hepatic negative GI ROS, Neg liver ROS,   Endo/Other  negative endocrine ROS  Renal/GU negative Renal ROS  negative genitourinary   Musculoskeletal   Abdominal Normal abdominal exam  (+)   Peds  (+) ADHD Hematology negative hematology ROS (+)   Anesthesia Other Findings   Reproductive/Obstetrics negative OB ROS                            Anesthesia Physical  Anesthesia Plan  ASA: II  Anesthesia Plan: General   Post-op Pain Management:    Induction: Intravenous  Airway Management Planned: LMA  Additional Equipment:   Intra-op Plan:   Post-operative Plan:   Informed Consent: I have reviewed the patients History and Physical, chart, labs and discussed the procedure including the risks, benefits and alternatives for the proposed anesthesia with the patient or authorized representative who has indicated his/her understanding and acceptance.     Plan Discussed with: CRNA and Surgeon  Anesthesia Plan Comments:        Anesthesia Quick Evaluation

## 2016-03-05 NOTE — Transfer of Care (Signed)
Immediate Anesthesia Transfer of Care Note  Patient: Tanya Peterson  Procedure(s) Performed: Procedure(s): DILATATION AND CURETTAGE /HYSTEROSCOPY (N/A)  Patient Location: PACU  Anesthesia Type:General  Level of Consciousness: awake  Airway & Oxygen Therapy: Patient Spontanous Breathing  Post-op Assessment: Report given to PACU RN  Post vital signs: stable  Filed Vitals:   03/05/16 1114  BP: 127/83  Pulse: 90  Temp: 36.8 C  Resp: 16    Complications: No apparent anesthesia complications

## 2016-03-05 NOTE — Anesthesia Postprocedure Evaluation (Signed)
Anesthesia Post Note  Patient: Tanya Peterson  Procedure(s) Performed: Procedure(s) (LRB): DILATATION AND CURETTAGE /HYSTEROSCOPY (N/A)  Patient location during evaluation: PACU Level of consciousness: awake Pain management: pain level controlled Vital Signs Assessment: post-procedure vital signs reviewed and stable Respiratory status: spontaneous breathing Cardiovascular status: stable Postop Assessment: no signs of nausea or vomiting Anesthetic complications: no    Last Vitals:  Filed Vitals:   03/05/16 1330 03/05/16 1345  BP: 131/71 120/73  Pulse: 59 65  Temp:    Resp: 12 16    Last Pain: There were no vitals filed for this visit.               [place]

## 2016-03-05 NOTE — Op Note (Signed)
Pre-Operative Diagnosis: 1) thickened endometrium Postoperative Diagnosis: 1) Endometrial polyp Procedure: Hysteroscopy, dilation and curettage Surgeon: Dr. Vanessa Kick Assistant: None Operative Findings: Uterus with endometrial polyp emanating from the left cornua. Uterus sounded to 6 cm. Deficit 200cc Specimen: endometrial curettings EBL: Total I/O In: 1000 [I.V.:1000] Out: 20 [Urine:20; Blood:5]   Tanya Peterson Is a 55 year old who presents for definitive surgical management for thickened endometrium. Please see the patient's history and physical for complete details of the history. Management options were discussed with the patient. R/B/A reviewed. Following appropriate informed consent was taken to the operating room. The patient was appropriately identified during a time out procedure. General anesthesia was administered and the patient was placed in the dorsal lithotomy position. The patient was prepped and draped in the normal sterile fashion. A speculum was placed into the vagina, a single-tooth tenaculum was placed on the anterior lip of the cervix, and 10 cc of 1% lidocaine was administered in a paracervical fashion. The cervix was serially dilated with Kennon Rounds dilators. The uterus sounded to 6 cm. The hysteroscope was introduced into the uterine cavity for the above findings. The hysteroscope was removed and a sharp curettage was performed. This completed the surgical procedure. The patient was transferred to the PACU in stable condition following the procedure

## 2016-03-05 NOTE — Anesthesia Procedure Notes (Signed)
Procedure Name: LMA Insertion Date/Time: 03/05/2016 12:32 PM Performed by: Casimer Lanius A Pre-anesthesia Checklist: Patient being monitored, Patient identified, Emergency Drugs available and Suction available Patient Re-evaluated:Patient Re-evaluated prior to inductionOxygen Delivery Method: Circle system utilized Preoxygenation: Pre-oxygenation with 100% oxygen Intubation Type: IV induction and Inhalational induction Ventilation: Mask ventilation without difficulty LMA: LMA inserted LMA Size: 4.0 Number of attempts: 1 Dental Injury: Teeth and Oropharynx as per pre-operative assessment

## 2016-03-06 ENCOUNTER — Encounter (HOSPITAL_COMMUNITY): Payer: Self-pay | Admitting: Obstetrics and Gynecology

## 2016-03-09 ENCOUNTER — Encounter: Payer: Self-pay | Admitting: Podiatry

## 2016-03-09 ENCOUNTER — Ambulatory Visit (INDEPENDENT_AMBULATORY_CARE_PROVIDER_SITE_OTHER): Payer: Managed Care, Other (non HMO) | Admitting: Podiatry

## 2016-03-09 ENCOUNTER — Ambulatory Visit (INDEPENDENT_AMBULATORY_CARE_PROVIDER_SITE_OTHER): Payer: Managed Care, Other (non HMO)

## 2016-03-09 VITALS — BP 121/79 | HR 87 | Resp 12

## 2016-03-09 DIAGNOSIS — M7732 Calcaneal spur, left foot: Secondary | ICD-10-CM | POA: Diagnosis not present

## 2016-03-09 DIAGNOSIS — R52 Pain, unspecified: Secondary | ICD-10-CM

## 2016-03-09 DIAGNOSIS — M7662 Achilles tendinitis, left leg: Secondary | ICD-10-CM | POA: Diagnosis not present

## 2016-03-09 MED ORDER — METHYLPREDNISOLONE 4 MG PO TBPK: ORAL_TABLET | ORAL | Status: DC

## 2016-03-11 NOTE — Progress Notes (Signed)
Patient ID: Tanya Peterson, female   DOB: 18-Jan-1961, 55 y.o.   MRN: [id number]  Subjective: 55 year old female presents the office they for follow-up evaluation of left heel pain on the back. She said the area still sore although it has improved. She has been stretching and icing as well as taking anti-inflammatories. She is also tried changing her shoes. She states that she got a call from the compound pharmacy which she has not returned the call. Denies any systemic complaints such as fevers, chills, nausea, vomiting. No acute changes since last appointment, and no other complaints at this time.   Objective: AAO x3, NAD DP/PT pulses palpable bilaterally, CRT less than 3 seconds Protective sensation intact with Simms Weinstein monofilament There is continued tenderness on the posterior aspect the left heel and there is small retrocalcaneal exostosis palpable. There is no overlying edema, erythema. There is noted pain on the Achilles tendon no defect is noted. [place] Silos has is negative. There is no pain with medial to lateral compression of the calcaneus. No other areas of tenderness. Mild equinus is present. Small palpable non-mobile soft tissue mass present within the medial band of the plantar fascia in the arch of the foot. No overlying tenderness and there is no significant skin discoloration or skin changes. No areas of pinpoint bony tenderness or pain with vibratory sensation. MMT 5/5, ROM WNL. No edema, erythema, increase in warmth to bilateral lower extremities.  No open lesions or pre-ulcerative lesions.  No pain with calf compression, swelling, warmth, erythema  Assessment: 55 year old female with insertional Achilles tendinitis, retrocalcaneal exostosis; plantar fibroma.  Plan: -All treatment options discussed with the patient including all alternatives, risks, complications.  -X-rays were obtained and reviewed today. Small retrocalcaneal exostosis present. Plantar calcaneal spur also  present. -Ordered Medrol Dosepak. Once this is complete she can start anti-inflammatories. He'll take them together. -Order compound cream again for plantar fibroma. Wishes to hold off on steroid injection. -Will check orthotic coverage. -Discussed shoe gear changes. -Follow-up 3 weeks or sooner if any issues are to arise. -Patient encouraged to call the office with any questions, concerns, change in symptoms.   Celesta Gentile, DPM

## 2016-03-26 ENCOUNTER — Ambulatory Visit: Payer: Managed Care, Other (non HMO) | Admitting: Podiatry

## 2016-04-04 ENCOUNTER — Encounter: Payer: Self-pay | Admitting: Podiatry

## 2016-04-04 ENCOUNTER — Ambulatory Visit (INDEPENDENT_AMBULATORY_CARE_PROVIDER_SITE_OTHER): Payer: Managed Care, Other (non HMO) | Admitting: Podiatry

## 2016-04-04 DIAGNOSIS — M25572 Pain in left ankle and joints of left foot: Secondary | ICD-10-CM

## 2016-04-04 DIAGNOSIS — M779 Enthesopathy, unspecified: Secondary | ICD-10-CM | POA: Diagnosis not present

## 2016-04-04 DIAGNOSIS — G5752 Tarsal tunnel syndrome, left lower limb: Secondary | ICD-10-CM | POA: Diagnosis not present

## 2016-04-04 DIAGNOSIS — M7662 Achilles tendinitis, left leg: Secondary | ICD-10-CM

## 2016-04-05 NOTE — Progress Notes (Signed)
Patient ID: Tanya Peterson, female   DOB: September 28, 1961, 55 y.o.   MRN: [id number]  Subjective: 55 year old female presents the occipital palpable evaluation of Achilles tendinitis, retrocalcaneal exostosis. She states that she was doing much better over the last week she has noticed increased pain is on the outside aspect of her left foot. No recent injury or trauma. No redness or warmth. No recent treatment for this. She has continue with stretching, icing.  Denies any systemic complaints such as fevers, chills, nausea, vomiting. No acute changes since last appointment, and no other complaints at this time.   Objective: AAO x3, NAD DP/PT pulses palpable bilaterally, CRT less than 3 seconds There is continued tenderness to palpation on the posterior aspect of the heel along the insertion Achilles tendon into the calcaneus. Subjectively she states that she was doing much better but this pain started to reoccur last week as well. There is also tenderness along the lateral aspect of the foot on the sinus tarsi. There is mild discomfort with subtalar joint range of motion. There is localized edema over this area without any significant erythema or increase in warmth. Some mild discomfort on the ATFL. No areas of pinpoint bony tenderness there is no pain vibratory sensation. No areas of pinpoint bony tenderness or pain with vibratory sensation. MMT 5/5, ROM WNL. No edema, erythema, increase in warmth to bilateral lower extremities.  No open lesions or pre-ulcerative lesions.  No pain with calf compression, swelling, warmth, erythema  Assessment: Achilles tendinitis, retrocalcaneal exostosis with subtalar joint capsulitis, sinus tarsi syndrome.  Plan: -All treatment options discussed with the patient including all alternatives, risks, complications.  -This point given the tenderness on the sinus tarsi discussed steroid injection for which she wishes to proceed. Under sterile conditions a mixture Dexon is an  phosphate and lidocaine plain was infiltrated into the lateral aspect of the sinus tarsi without complications. Post injection care was discussed. -Given the continued pain and recurrence discuss further treatment options. We'll go ahead and get an MRI to a partial tear given the increase in pain and swelling. -She wishes to wear off on cam boot at this time. Continue with brace as needed as well as ice and anti-inflammatories. -Follow-up after MRI. -Patient encouraged to call the office with any questions, concerns, change in symptoms.   Celesta Gentile, DPM

## 2016-04-06 ENCOUNTER — Telehealth: Payer: Self-pay | Admitting: *Deleted

## 2016-04-06 DIAGNOSIS — G5752 Tarsal tunnel syndrome, left lower limb: Secondary | ICD-10-CM

## 2016-04-06 DIAGNOSIS — M7662 Achilles tendinitis, left leg: Secondary | ICD-10-CM

## 2016-04-06 DIAGNOSIS — M779 Enthesopathy, unspecified: Secondary | ICD-10-CM

## 2016-04-06 NOTE — Telephone Encounter (Addendum)
-----   Message from Trula Slade, DPM sent at 04/05/2016  7:24 PM EDT ----- Can you order and MRI of the left ankle to rule out partial tear of achilles/ankle ligament tear for [facility]? Thanks. 04/06/2016-Orders to [facility] [employer].  04/11/2016-[employer]ET[employer], STATES NO PRE-CERT IS REQUIRED [place] [id number], REFERENCE [phone] (PT'S AETNA ID#). FAXED TO [facility].

## 2016-04-14 ENCOUNTER — Ambulatory Visit (HOSPITAL_BASED_OUTPATIENT_CLINIC_OR_DEPARTMENT_OTHER)
Admission: RE | Admit: 2016-04-14 | Discharge: 2016-04-14 | Disposition: A | Discharge status: Home / Self Care | Payer: Managed Care, Other (non HMO) | Source: Ambulatory Visit | Attending: Podiatry | Admitting: Podiatry

## 2016-04-14 DIAGNOSIS — M25472 Effusion, left ankle: Secondary | ICD-10-CM | POA: Insufficient documentation

## 2016-04-14 DIAGNOSIS — M899 Disorder of bone, unspecified: Secondary | ICD-10-CM | POA: Diagnosis not present

## 2016-04-14 DIAGNOSIS — G5752 Tarsal tunnel syndrome, left lower limb: Secondary | ICD-10-CM | POA: Diagnosis not present

## 2016-04-14 DIAGNOSIS — M9260 Juvenile osteochondrosis of tarsus, unspecified ankle: Secondary | ICD-10-CM | POA: Insufficient documentation

## 2016-04-14 DIAGNOSIS — M779 Enthesopathy, unspecified: Secondary | ICD-10-CM | POA: Insufficient documentation

## 2016-04-14 DIAGNOSIS — M65872 Other synovitis and tenosynovitis, left ankle and foot: Secondary | ICD-10-CM | POA: Insufficient documentation

## 2016-04-14 DIAGNOSIS — M722 Plantar fascial fibromatosis: Secondary | ICD-10-CM | POA: Insufficient documentation

## 2016-04-14 DIAGNOSIS — M7662 Achilles tendinitis, left leg: Secondary | ICD-10-CM | POA: Diagnosis present

## 2016-04-23 ENCOUNTER — Ambulatory Visit (INDEPENDENT_AMBULATORY_CARE_PROVIDER_SITE_OTHER): Payer: Managed Care, Other (non HMO) | Admitting: Podiatry

## 2016-04-23 ENCOUNTER — Encounter: Payer: Self-pay | Admitting: Podiatry

## 2016-04-23 VITALS — BP 125/76 | HR 76 | Resp 18

## 2016-04-23 DIAGNOSIS — M7662 Achilles tendinitis, left leg: Secondary | ICD-10-CM | POA: Diagnosis not present

## 2016-04-23 DIAGNOSIS — T148 Other injury of unspecified body region: Secondary | ICD-10-CM | POA: Diagnosis not present

## 2016-04-23 DIAGNOSIS — T148XXA Other injury of unspecified body region, initial encounter: Secondary | ICD-10-CM

## 2016-04-26 ENCOUNTER — Emergency Department (HOSPITAL_BASED_OUTPATIENT_CLINIC_OR_DEPARTMENT_OTHER)
Admission: EM | Admit: 2016-04-26 | Discharge: 2016-04-27 | Disposition: A | Discharge status: Home / Self Care | Payer: Managed Care, Other (non HMO) | Attending: Emergency Medicine | Admitting: Emergency Medicine

## 2016-04-26 ENCOUNTER — Emergency Department (HOSPITAL_BASED_OUTPATIENT_CLINIC_OR_DEPARTMENT_OTHER): Payer: Managed Care, Other (non HMO)

## 2016-04-26 ENCOUNTER — Encounter (HOSPITAL_BASED_OUTPATIENT_CLINIC_OR_DEPARTMENT_OTHER): Payer: Self-pay

## 2016-04-26 DIAGNOSIS — G710 Muscular dystrophy: Secondary | ICD-10-CM | POA: Diagnosis not present

## 2016-04-26 DIAGNOSIS — J45909 Unspecified asthma, uncomplicated: Secondary | ICD-10-CM | POA: Insufficient documentation

## 2016-04-26 DIAGNOSIS — Z7982 Long term (current) use of aspirin: Secondary | ICD-10-CM | POA: Insufficient documentation

## 2016-04-26 DIAGNOSIS — G7102 Facioscapulohumeral muscular dystrophy: Secondary | ICD-10-CM

## 2016-04-26 DIAGNOSIS — R519 Headache, unspecified: Secondary | ICD-10-CM

## 2016-04-26 DIAGNOSIS — R51 Headache: Secondary | ICD-10-CM

## 2016-04-26 DIAGNOSIS — F329 Major depressive disorder, single episode, unspecified: Secondary | ICD-10-CM | POA: Insufficient documentation

## 2016-04-26 LAB — CBC WITH DIFFERENTIAL/PLATELET
Basophils Absolute: 0 10*3/uL (ref 0.0–0.1)
Basophils Relative: 1 %
Eosinophils Absolute: 0.1 10*3/uL (ref 0.0–0.7)
Eosinophils Relative: 1 %
HEMATOCRIT: 42.5 % (ref 36.0–46.0)
HEMOGLOBIN: 14.1 g/dL (ref 12.0–15.0)
LYMPHS ABS: 1.9 10*3/uL (ref 0.7–4.0)
LYMPHS PCT: 27 %
MCH: 30.9 pg (ref 26.0–34.0)
MCHC: 33.2 g/dL (ref 30.0–36.0)
MCV: 93.2 fL (ref 78.0–100.0)
MONOS PCT: 7 %
Monocytes Absolute: 0.5 10*3/uL (ref 0.1–1.0)
NEUTROS ABS: 4.5 10*3/uL (ref 1.7–7.7)
NEUTROS PCT: 64 %
PLATELETS: 361 10*3/uL (ref 150–400)
RBC: 4.56 MIL/uL (ref 3.87–5.11)
RDW: 12.5 % (ref 11.5–15.5)
WBC: 6.9 10*3/uL (ref 4.0–10.5)

## 2016-04-26 LAB — BASIC METABOLIC PANEL
Anion gap: 7 (ref 5–15)
BUN: 11 mg/dL (ref 6–20)
CHLORIDE: 104 mmol/L (ref 101–111)
CO2: 28 mmol/L (ref 22–32)
CREATININE: 0.58 mg/dL (ref 0.44–1.00)
Calcium: 9.6 mg/dL (ref 8.9–10.3)
Glucose, Bld: 99 mg/dL (ref 65–99)
POTASSIUM: 4.2 mmol/L (ref 3.5–5.1)
SODIUM: 139 mmol/L (ref 135–145)

## 2016-04-26 LAB — URINALYSIS, ROUTINE W REFLEX MICROSCOPIC
BILIRUBIN URINE: NEGATIVE
Glucose, UA: NEGATIVE mg/dL
HGB URINE DIPSTICK: NEGATIVE
Ketones, ur: NEGATIVE mg/dL
NITRITE: NEGATIVE
PROTEIN: NEGATIVE mg/dL
Specific Gravity, Urine: 1.007 (ref 1.005–1.030)
pH: 7.5 (ref 5.0–8.0)

## 2016-04-26 LAB — URINE MICROSCOPIC-ADD ON: Bacteria, UA: NONE SEEN

## 2016-04-26 LAB — SEDIMENTATION RATE: Sed Rate: 4 mm/hr (ref 0–22)

## 2016-04-26 MED ORDER — PROCHLORPERAZINE EDISYLATE 5 MG/ML IJ SOLN
5.0000 mg | Freq: Once | INTRAMUSCULAR | Status: AC
  Administered 2016-04-26: 5 mg via INTRAVENOUS

## 2016-04-26 MED ORDER — IOPAMIDOL (ISOVUE-370) INJECTION 76%
100.0000 mL | Freq: Once | INTRAVENOUS | Status: AC | PRN
  Administered 2016-04-26: 100 mL via INTRAVENOUS

## 2016-04-26 MED ORDER — DIPHENHYDRAMINE HCL 50 MG/ML IJ SOLN
25.0000 mg | Freq: Once | INTRAMUSCULAR | Status: AC
  Administered 2016-04-26: 25 mg via INTRAVENOUS
  Filled 2016-04-26: qty 1

## 2016-04-26 MED ORDER — KETOROLAC TROMETHAMINE 30 MG/ML IJ SOLN
30.0000 mg | Freq: Once | INTRAMUSCULAR | Status: AC
  Administered 2016-04-26: 30 mg via INTRAVENOUS
  Filled 2016-04-26: qty 1

## 2016-04-26 MED ORDER — PROCHLORPERAZINE EDISYLATE 5 MG/ML IJ SOLN
5.0000 mg | Freq: Once | INTRAMUSCULAR | Status: DC
  Filled 2016-04-26: qty 2

## 2016-04-26 NOTE — ED Notes (Signed)
Pt states they events leading up to the symptoms was being in a high stress work environment. Pt states "things were building up at work. I felt the pressure building in my head." Pt states with the HA she also got a tight feeling in her face. Pt endorses photo- and phonophobia with the HA. Pt states this feels different from her previous migraines because of the "facial tightness."

## 2016-04-26 NOTE — ED Notes (Signed)
Patient transported to CT 

## 2016-04-26 NOTE — ED Notes (Signed)
Nurse first-pt refused w/c

## 2016-04-26 NOTE — ED Provider Notes (Signed)
CSN: [id number]     Arrival date & time 04/26/16  1947 History   First MD Initiated Contact with Patient 04/26/16 2024     Chief Complaint  Patient presents with  . Headache     (Consider location/radiation/quality/duration/timing/severity/associated sxs/prior Treatment) HPI Tanya Peterson is a(n) 55 y.o. female who presents To the emergency department with chief complaint of headache. Patient states that she was having a very stressful day at work. She took half of a 1 mg Ativan. 30 minutes later she had sudden onset of severe headache with severe neck pain, severe bilateral temporal pain, blurry vision and severe jaw pain. She states it has eased off some, but it is still present. She denies a history of chronic headaches, although she did have migraine headaches in her early 13s. She has associated photophobia and phonophobia without nausea or vomiting. She states she's never had a headache like this before. She denies any other neurologic symptoms. She is not taking any new medications. Past Medical History  Diagnosis Date  . ADHD (attention deficit hyperactivity disorder)   . Asthma     rarely uses inhaler  . Depression   . History of kidney stones   . Sleep disorder     tx with nuvigal  . Anxiety attack   . Panic attacks    Past Surgical History  Procedure Laterality Date  . Liposuction    . Wisdom tooth extraction    . Colonoscopy with propofol N/A 01/11/2015    Procedure: COLONOSCOPY WITH PROPOFOL;  Surgeon: Garlan Fair, MD;  Location: [facility];  Service: Endoscopy;  Laterality: N/A;  . Kdiney stone surgery  12/2015  . Hysteroscopy w/d&c N/A 03/05/2016    Procedure: DILATATION AND CURETTAGE /HYSTEROSCOPY;  Surgeon: Vanessa Kick, MD;  Location: [facility];  Service: Gynecology;  Laterality: N/A;   No family history on file. Social History  Substance Use Topics  . Smoking status: Never Smoker   . Smokeless tobacco: Never Used  . Alcohol Use: No   OB History    No data  available     Review of Systems  Ten systems reviewed and are negative for acute change, except as noted in the HPI.    Allergies  Review of patient's allergies indicates no known allergies.  Home Medications   Prior to Admission medications   Medication Sig Start Date End Date Taking? Authorizing Provider  albuterol (PROVENTIL HFA;VENTOLIN HFA) 108 (90 BASE) MCG/ACT inhaler Inhale 1 puff into the lungs every 30 (thirty) minutes as needed for wheezing or shortness of breath.    Historical Provider, MD  amphetamine-dextroamphetamine (ADDERALL XR) 10 MG 24 hr capsule Take 10 mg by mouth daily.    Historical Provider, MD  Armodafinil (NUVIGIL) 250 MG tablet Take 250 mg by mouth daily.    Historical Provider, MD  aspirin EC 81 MG tablet Take 1 tablet (81 mg total) by mouth daily. 04/27/16   Margarita Mail, PA-C  ibuprofen (ADVIL,MOTRIN) 600 MG tablet Take 1 tablet (600 mg total) by mouth every 6 (six) hours as needed. 03/05/16   Vanessa Kick, MD  LORazepam (ATIVAN) 1 MG tablet Take 1 tablet (1 mg total) by mouth 3 (three) times daily as needed for anxiety. 08/29/15   Kristen N Ward, DO  methylPREDNISolone (MEDROL DOSEPAK) 4 MG TBPK tablet Take as directed 03/09/16   Trula Slade, DPM  oxyCODONE-acetaminophen (ROXICET) 5-325 MG tablet Take 1-2 tablets by mouth every 4 (four) hours as needed. 03/05/16   Vanessa Kick,  MD   BP 130/78 mmHg  Pulse 87  Temp(Src) 98.8 F (37.1 C) (Oral)  Resp 20  Ht 5\' 4"  (1.626 m)  Wt 75.751 kg  BMI 28.65 kg/m2  SpO2 100% Physical Exam  Constitutional: She is oriented to person, place, and time. She appears well-developed and well-nourished. No distress.  HENT:  Head: Normocephalic and atraumatic.  Right Ear: External ear normal.  Left Ear: External ear normal.  Mouth/Throat: Oropharynx is clear and moist. No oropharyngeal exudate.  No temporal bruits  Eyes: Conjunctivae and EOM are normal. Pupils are equal, round, and reactive to light. No scleral  icterus.  No horizontal, vertical or rotational nystagmus  Neck: Normal range of motion. Neck supple. No JVD present. No thyromegaly present.  Full active and passive ROM without pain No midline or paraspinal tenderness No nuchal rigidity or meningeal signs  Cardiovascular: Normal rate, regular rhythm, normal heart sounds and intact distal pulses.  Exam reveals no gallop and no friction rub.   No murmur heard. Pulmonary/Chest: Effort normal and breath sounds normal. No respiratory distress. She has no wheezes. She has no rales.  Abdominal: Soft. Bowel sounds are normal. She exhibits no distension and no mass. There is no tenderness. There is no rebound and no guarding.  Musculoskeletal: Normal range of motion. She exhibits no edema or tenderness.  No meningismus  Lymphadenopathy:    She has no cervical adenopathy.  Neurological: She is alert and oriented to person, place, and time. She has normal reflexes. No cranial nerve deficit. She exhibits normal muscle tone. Coordination normal.  Mental Status:  Alert, oriented, thought content appropriate. Speech fluent without evidence of aphasia. Able to follow 2 step commands without difficulty.  Cranial Nerves:  II:  Peripheral visual fields grossly normal, pupils equal, round, reactive to light III,IV, VI: ptosis not present, extra-ocular motions intact bilaterally  V,VII: smile symmetric, facial light touch sensation equal VIII: hearing grossly normal bilaterally  IX,X: midline uvula rise  XI: bilateral shoulder shrug equal and strong XII: midline tongue extension  Motor:  5/5 in upper and lower extremities bilaterally including strong and equal grip strength and dorsiflexion/plantar flexion Sensory: Pinprick and light touch normal in all extremities.  Deep Tendon Reflexes: 2+ and symmetric  Cerebellar: normal finger-to-nose with bilateral upper extremities Gait: normal gait and balance CV: distal pulses palpable throughout   Skin: Skin  is warm and dry. No rash noted. She is not diaphoretic.  Psychiatric: She has a normal mood and affect. Her behavior is normal. Judgment and thought content normal.  Nursing note and vitals reviewed.   ED Course  Procedures (including critical care time) Labs Review Labs Reviewed  URINALYSIS, ROUTINE W REFLEX MICROSCOPIC (NOT AT [facility]) - Abnormal; Notable for the following:    Leukocytes, UA TRACE (*)    All other components within normal limits  URINE MICROSCOPIC-ADD ON - Abnormal; Notable for the following:    Squamous Epithelial / LPF 0-5 (*)    All other components within normal limits  CBC WITH DIFFERENTIAL/PLATELET  BASIC METABOLIC PANEL  SEDIMENTATION RATE    Imaging Review Ct Angio Head W/cm &/or Wo Cm  04/26/2016  CLINICAL DATA:  Initial evaluation for acute headache, blurry vision. EXAM: CT ANGIOGRAPHY HEAD AND NECK TECHNIQUE: Multidetector CT imaging of the head and neck was performed using the standard protocol during bolus administration of intravenous contrast. Multiplanar CT image reconstructions and MIPs were obtained to evaluate the vascular anatomy. Carotid stenosis measurements (when applicable) are obtained utilizing NASCET  criteria, using the distal internal carotid diameter as the denominator. CONTRAST:  100 cc of Isovue 370. COMPARISON:  None. FINDINGS: CT HEAD Mild age-related cerebral atrophy with chronic small vessel ischemic disease. No acute intracranial hemorrhage or large vessel territory infarct. No mass lesion, midline shift, or mass effect. No hydrocephalus. No extra-axial fluid collection. Scalp soft tissues demonstrate no acute abnormality. Globes and orbits within normal limits. Paranasal sinuses are clear. No mastoid effusion. Calvarium intact. CTA NECK Aortic arch: Visualized aortic arch of normal caliber with normal branch pattern. No significant atheromatous plaque within the arch itself. No high-grade stenosis at the origin of the great vessels. Visualized  subclavian arteries widely patent. Right carotid system: Right common carotid artery widely patent from its origin to the bifurcation. No significant atheromatous plaque about the right bifurcation. Multifocal irregularity within the distal right ICA suggestive of the thin the. No associated dissection or stenosis. Right ICA otherwise unremarkable. Right external carotid artery and its branches within normal limits. Left carotid system: Left common carotid artery patent from its origin to the bifurcation. Minimal atheromatous irregularity about the left bifurcation without stenosis. Multi focal irregularity within the distal left ICA suggestive of possible FMD. No associated flow limiting stenosis or dissection. Left ICA otherwise normal. Left external carotid artery and its branches within normal limits. Vertebral arteries:Both vertebral arteries arise from the subclavian arteries. Right vertebral artery is dominant. Vertebral arteries widely patent without stenosis, dissection, or occlusion. Skeleton: No acute osseous of no worrisome lytic or blastic osseous lesions. Moderate degenerative spondylolysis at C5-6 and C6-7. Multilevel facet arthrosis noted. Other neck: Visualized lungs are clear. Visualized mediastinum demonstrates no acute abnormality. Subcentimeter hypodense nodule noted within the right lobe of thyroid, of doubtful clinical significance. No adenopathy within the neck. No acute soft tissue abnormality. CTA HEAD Anterior circulation: The petrous, cavernous, and supraclinoid segments of the internal carotid arteries are widely patent without stenosis. A1 segments patent. Anterior communicating artery normal. Anterior cerebral arteries well opacified to their distal aspects without abnormality. M1 segments widely patent without stenosis or occlusion. MCA bifurcations normal. No proximal M2 branch stenosis or occlusion. Distal MCA branches well opacified and symmetric. Posterior circulation: Dominant  right vertebral artery widely patent to the vertebrobasilar junction. Hypoplastic left vertebral artery patent as well. Posterior inferior cerebral arteries patent. Basilar artery mildly tortuous but well opacified to its distal aspect. No basilar tip stenosis. Superior cerebral arteries patent bilaterally. Both of the posterior cerebral arteries arise from the basilar artery and are well opacified to their distal aspects. Small bilateral posterior communicating arteries noted. Venous sinuses: Patent without evidence for venous sinus thrombosis. Anatomic variants: O anatomic variant. No aneurysm or vascular malformation. Delayed phase: No abnormal enhancement. IMPRESSION: CTA NECK IMPRESSION: 1. 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 2. Otherwise normal CTA of the neck. No high-grade or critical stenosis. CTA HEAD IMPRESSION: 1. No acute intracranial process. 2. Normal intracranial CTA. No large vessel occlusion identified. No high-grade or flow-limiting stenosis. Electronically Signed   By: Jeannine Boga M.D.   On: 04/26/2016 23:40   Ct Angio Neck W/cm &/or Wo/cm  04/26/2016  CLINICAL DATA:  Initial evaluation for acute headache, blurry vision. EXAM: CT ANGIOGRAPHY HEAD AND NECK TECHNIQUE: Multidetector CT imaging of the head and neck was performed using the standard protocol during bolus administration of intravenous contrast. Multiplanar CT image reconstructions and MIPs were obtained to evaluate the vascular anatomy. Carotid stenosis measurements (when applicable) are obtained utilizing NASCET  criteria, using the distal internal carotid diameter as the denominator. CONTRAST:  100 cc of Isovue 370. COMPARISON:  None. FINDINGS: CT HEAD Mild age-related cerebral atrophy with chronic small vessel ischemic disease. No acute intracranial hemorrhage or large vessel territory infarct. No mass lesion, midline shift, or mass effect. No hydrocephalus. No  extra-axial fluid collection. Scalp soft tissues demonstrate no acute abnormality. Globes and orbits within normal limits. Paranasal sinuses are clear. No mastoid effusion. Calvarium intact. CTA NECK Aortic arch: Visualized aortic arch of normal caliber with normal branch pattern. No significant atheromatous plaque within the arch itself. No high-grade stenosis at the origin of the great vessels. Visualized subclavian arteries widely patent. Right carotid system: Right common carotid artery widely patent from its origin to the bifurcation. No significant atheromatous plaque about the right bifurcation. Multifocal irregularity within the distal right ICA suggestive of the thin the. No associated dissection or stenosis. Right ICA otherwise unremarkable. Right external carotid artery and its branches within normal limits. Left carotid system: Left common carotid artery patent from its origin to the bifurcation. Minimal atheromatous irregularity about the left bifurcation without stenosis. Multi focal irregularity within the distal left ICA suggestive of possible FMD. No associated flow limiting stenosis or dissection. Left ICA otherwise normal. Left external carotid artery and its branches within normal limits. Vertebral arteries:Both vertebral arteries arise from the subclavian arteries. Right vertebral artery is dominant. Vertebral arteries widely patent without stenosis, dissection, or occlusion. Skeleton: No acute osseous of no worrisome lytic or blastic osseous lesions. Moderate degenerative spondylolysis at C5-6 and C6-7. Multilevel facet arthrosis noted. Other neck: Visualized lungs are clear. Visualized mediastinum demonstrates no acute abnormality. Subcentimeter hypodense nodule noted within the right lobe of thyroid, of doubtful clinical significance. No adenopathy within the neck. No acute soft tissue abnormality. CTA HEAD Anterior circulation: The petrous, cavernous, and supraclinoid segments of the  internal carotid arteries are widely patent without stenosis. A1 segments patent. Anterior communicating artery normal. Anterior cerebral arteries well opacified to their distal aspects without abnormality. M1 segments widely patent without stenosis or occlusion. MCA bifurcations normal. No proximal M2 branch stenosis or occlusion. Distal MCA branches well opacified and symmetric. Posterior circulation: Dominant right vertebral artery widely patent to the vertebrobasilar junction. Hypoplastic left vertebral artery patent as well. Posterior inferior cerebral arteries patent. Basilar artery mildly tortuous but well opacified to its distal aspect. No basilar tip stenosis. Superior cerebral arteries patent bilaterally. Both of the posterior cerebral arteries arise from the basilar artery and are well opacified to their distal aspects. Small bilateral posterior communicating arteries noted. Venous sinuses: Patent without evidence for venous sinus thrombosis. Anatomic variants: O anatomic variant. No aneurysm or vascular malformation. Delayed phase: No abnormal enhancement. IMPRESSION: CTA NECK IMPRESSION: 1. 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 2. Otherwise normal CTA of the neck. No high-grade or critical stenosis. CTA HEAD IMPRESSION: 1. No acute intracranial process. 2. Normal intracranial CTA. No large vessel occlusion identified. No high-grade or flow-limiting stenosis. Electronically Signed   By: Jeannine Boga M.D.   On: 04/26/2016 23:40   I have personally reviewed and evaluated these images and lab results as part of my medical decision-making.   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 Headache  sxs seem consistent with this diagosis.  Pt HA treated and improved while in ED. Headache  is not concerning for [facility], ICH,  Meningitis, or temporal arteritis. Sed rate is not elevated. I have discussed the diagnosis with Dr. Nicole Kindred. No further work up is indicated at this time. I have discussed the diagnosis to the best of my ability with the patient. Patient is to begin 81 mg asa daily as recommended by Dr. Nicole Kindred.   Pt is afebrile with no focal neuro deficits, nuchal rigidity, or change in vision. Pt is to follow up with neurology. Discussed return precautions.Pt verbalizes understanding and is agreeable with plan to dc.        Margarita Mail, PA-C 04/29/16 2053  Tanna Furry, MD 05/12/16 [phone]

## 2016-04-26 NOTE — Progress Notes (Signed)
Patient ID: Tanya Peterson, female   DOB: 03-31-1961, 55 y.o.   MRN: [id number]  Subjective: 55 year old female presents the office today for follow-up evaluation left foot and ankle pain and discussed MRI results. She states that she can these have pain on the operative aspect of her ankle. The pain to Achilles tendon area has improved however. No recent injury since last appointment. Denies any systemic complaints such as fevers, chills, nausea, vomiting. No acute changes since last appointment, and no other complaints at this time.   Objective: AAO x3, NAD DP/PT pulses palpable bilaterally, CRT less than 3 seconds This continuation of tenderness on the lateral aspect of the left ankle along the course of the peroneal tendons tenderness localized edema to this area. There is mild discomfort with subtalar joint range of motion however there is no crepitation or restriction. There is decreased tenderness on the posterior aspect the calcaneus on the insertion of the Achilles tendon and Thompson test is negative there is no defect noted within the Achilles tendon. There is no other areas tenderness bilaterally. There is no edema otherwise or increase in warmth to bilateral lower extremities.  No open lesions or pre-ulcerative lesions.  No pain with calf compression, swelling, warmth, erythema  Assessment:  56 year old female left peroneal tendon tear, Achilles tendinitis   Plan: -All treatment options discussed with the patient including all alternatives, risks, complications.  -MRI results were discussed with the patient. See below.  - I discussed both conservative and surgical treatment options. This time immobilized in a cam boot which was dispensed today. If she is not having improvement in symptoms and discuss surgical intervention. Also her back in 3 weeks and further discuss. -Patient encouraged to call the office with any questions, concerns, change in symptoms.   IMPRESSION: 1. The  longitudinal tearing of the peroneus brevis and tendinopathy and likely partially tear peroneus longus, with extensive peroneus tendon sheath tenosynovitis and overlying subcutaneous edema. This corresponds to the marked site of the patient's symptoms. 2. Minimal distal Achilles tendinopathy with a small amount of edema posterior to the distal Achilles insertion. 3. Mild plantar fasciitis proximally involving the medial band. 4. Mild Haglund deformity. 5. Thickened and indistinct anterior talofibular ligament suggesting sprain. 6. Posterior effusion of the posterior subtalar facet. 7. Small geode or degenerative subcortical cystic lesion along the articular margin of the medial malleolus.  Celesta Gentile, DPM

## 2016-04-26 NOTE — ED Notes (Signed)
Bilateral head pressure with some vision changes and nausea and increased anxiety with SOB.  Pt took an ativan at home to try to help with the anxiety, nothing for headache.  Hx of headaches, alert and oriented in triage, no numbness or weakness.

## 2016-04-27 MED ORDER — ASPIRIN EC 81 MG PO TBEC: 81.0000 mg | DELAYED_RELEASE_TABLET | Freq: Every day | ORAL | Status: AC

## 2016-04-27 NOTE — ED Notes (Addendum)
PA at bedside discussing test results and dispo plan of care. 

## 2016-04-27 NOTE — Discharge Instructions (Signed)
You are having a headache. No specific cause was found today for your headache. It may have been a migraine or other cause of headache. Stress, anxiety, fatigue, and depression are common triggers for headaches. Your headache today does not appear to be life-threatening or require hospitalization, but often the exact cause of headaches is not determined in the emergency department. Therefore, follow-up with your doctor is very important to find out what may have caused your headache, and whether or not you need any further diagnostic testing or treatment. Sometimes headaches can appear benign (not harmful), but then more serious symptoms can develop which should prompt an immediate re-evaluation by your doctor or the emergency department. SEEK MEDICAL ATTENTION IF: You develop possible problems with medications prescribed.  The medications don't resolve your headache, if it recurs , or if you have multiple episodes of vomiting or can't take fluids. You have a change from the usual headache. RETURN IMMEDIATELY IF you develop a sudden, severe headache or confusion, become poorly responsive or faint, develop a fever above 100.38F or problem breathing, have a change in speech, vision, swallowing, or understanding, or develop new weakness, numbness, tingling, incoordination, or have a seizure.  Migraine Headache A migraine headache is an intense, throbbing pain on one or both sides of your head. A migraine can last for 30 minutes to several hours. CAUSES  The exact cause of a migraine headache is not always known. However, a migraine may be caused when nerves in the brain become irritated and release chemicals that cause inflammation. This causes pain. Certain things may also trigger migraines, such as:  Alcohol.  Smoking.  Stress.  Menstruation.  Aged cheeses.  Foods or drinks that contain nitrates, glutamate, aspartame, or tyramine.  Lack of  sleep.  Chocolate.  Caffeine.  Hunger.  Physical exertion.  Fatigue.  Medicines used to treat chest pain (nitroglycerine), birth control pills, estrogen, and some blood pressure medicines. SIGNS AND SYMPTOMS  Pain on one or both sides of your head.  Pulsating or throbbing pain.  Severe pain that prevents daily activities.  Pain that is aggravated by any physical activity.  Nausea, vomiting, or both.  Dizziness.  Pain with exposure to bright lights, loud noises, or activity.  General sensitivity to bright lights, loud noises, or smells. Before you get a migraine, you may get warning signs that a migraine is coming (aura). An aura may include:  Seeing flashing lights.  Seeing bright spots, halos, or zigzag lines.  Having tunnel vision or blurred vision.  Having feelings of numbness or tingling.  Having trouble talking.  Having muscle weakness. DIAGNOSIS  A migraine headache is often diagnosed based on:  Symptoms.  Physical exam.  A CT scan or MRI of your head. These imaging tests cannot diagnose migraines, but they can help rule out other causes of headaches. TREATMENT Medicines may be given for pain and nausea. Medicines can also be given to help prevent recurrent migraines.  HOME CARE INSTRUCTIONS  Only take over-the-counter or prescription medicines for pain or discomfort as directed by your health care provider. The use of long-term narcotics is not recommended.  Lie down in a dark, quiet room when you have a migraine.  Keep a journal to find out what may trigger your migraine headaches. For example, write down:  What you eat and drink.  How much sleep you get.  Any change to your diet or medicines.  Limit alcohol consumption.  Quit smoking if you smoke.  Get 7-9 hours of  sleep, or as recommended by your health care provider.  Limit stress.  Keep lights dim if bright lights bother you and make your migraines worse. SEEK IMMEDIATE MEDICAL  CARE IF:   Your migraine becomes severe.  You have a fever.  You have a stiff neck.  You have vision loss.  You have muscular weakness or loss of muscle control.  You start losing your balance or have trouble walking.  You feel faint or pass out.  You have severe symptoms that are different from your first symptoms. MAKE SURE YOU:   Understand these instructions.  Will watch your condition.  Will get help right away if you are not doing well or get worse.   This information is not intended to replace advice given to you by your health care provider. Make sure you discuss any questions you have with your health care provider.   Document Released: 11/05/2005 Document Revised: 11/26/2014 Document Reviewed: 07/13/2013 Elsevier Interactive Patient Education [employer].

## 2016-05-03 ENCOUNTER — Encounter (HOSPITAL_BASED_OUTPATIENT_CLINIC_OR_DEPARTMENT_OTHER): Payer: Self-pay | Admitting: *Deleted

## 2016-05-03 ENCOUNTER — Other Ambulatory Visit: Payer: Self-pay

## 2016-05-03 ENCOUNTER — Emergency Department (HOSPITAL_BASED_OUTPATIENT_CLINIC_OR_DEPARTMENT_OTHER): Payer: Managed Care, Other (non HMO)

## 2016-05-03 ENCOUNTER — Emergency Department (HOSPITAL_BASED_OUTPATIENT_CLINIC_OR_DEPARTMENT_OTHER)
Admission: EM | Admit: 2016-05-03 | Discharge: 2016-05-04 | Disposition: A | Discharge status: Home / Self Care | Payer: Managed Care, Other (non HMO) | Attending: Emergency Medicine | Admitting: Emergency Medicine

## 2016-05-03 DIAGNOSIS — J45909 Unspecified asthma, uncomplicated: Secondary | ICD-10-CM | POA: Diagnosis not present

## 2016-05-03 DIAGNOSIS — R000 Tachycardia, unspecified: Secondary | ICD-10-CM | POA: Diagnosis not present

## 2016-05-03 DIAGNOSIS — F329 Major depressive disorder, single episode, unspecified: Secondary | ICD-10-CM | POA: Diagnosis not present

## 2016-05-03 DIAGNOSIS — R002 Palpitations: Secondary | ICD-10-CM | POA: Diagnosis present

## 2016-05-03 DIAGNOSIS — M542 Cervicalgia: Secondary | ICD-10-CM | POA: Diagnosis not present

## 2016-05-03 DIAGNOSIS — M549 Dorsalgia, unspecified: Secondary | ICD-10-CM | POA: Diagnosis not present

## 2016-05-03 DIAGNOSIS — R0600 Dyspnea, unspecified: Secondary | ICD-10-CM

## 2016-05-03 LAB — CBC WITH DIFFERENTIAL/PLATELET
BASOS PCT: 1 %
Basophils Absolute: 0 10*3/uL (ref 0.0–0.1)
EOS ABS: 0.2 10*3/uL (ref 0.0–0.7)
EOS PCT: 3 %
HCT: 40.2 % (ref 36.0–46.0)
Hemoglobin: 13.9 g/dL (ref 12.0–15.0)
LYMPHS ABS: 2.1 10*3/uL (ref 0.7–4.0)
Lymphocytes Relative: 36 %
MCH: 31.6 pg (ref 26.0–34.0)
MCHC: 34.6 g/dL (ref 30.0–36.0)
MCV: 91.4 fL (ref 78.0–100.0)
Monocytes Absolute: 0.5 10*3/uL (ref 0.1–1.0)
Monocytes Relative: 9 %
Neutro Abs: 3 10*3/uL (ref 1.7–7.7)
Neutrophils Relative %: 51 %
PLATELETS: 343 10*3/uL (ref 150–400)
RBC: 4.4 MIL/uL (ref 3.87–5.11)
RDW: 12.2 % (ref 11.5–15.5)
WBC: 5.8 10*3/uL (ref 4.0–10.5)

## 2016-05-03 LAB — BASIC METABOLIC PANEL
Anion gap: 6 (ref 5–15)
BUN: 13 mg/dL (ref 6–20)
CALCIUM: 9.3 mg/dL (ref 8.9–10.3)
CO2: 23 mmol/L (ref 22–32)
CREATININE: 0.76 mg/dL (ref 0.44–1.00)
Chloride: 109 mmol/L (ref 101–111)
Glucose, Bld: 133 mg/dL — ABNORMAL HIGH (ref 65–99)
Potassium: 3.6 mmol/L (ref 3.5–5.1)
SODIUM: 138 mmol/L (ref 135–145)

## 2016-05-03 LAB — D-DIMER, QUANTITATIVE: D-Dimer, Quant: 0.34 ug/mL-FEU (ref 0.00–0.50)

## 2016-05-03 LAB — TROPONIN I

## 2016-05-03 MED ORDER — SODIUM CHLORIDE 0.9 % IV BOLUS (SEPSIS)
1000.0000 mL | Freq: Once | INTRAVENOUS | Status: AC
  Administered 2016-05-03: 1000 mL via INTRAVENOUS

## 2016-05-03 MED ORDER — ACETAMINOPHEN 325 MG PO TABS
650.0000 mg | ORAL_TABLET | Freq: Once | ORAL | Status: AC
Start: 2016-05-03 — End: 2016-05-03
  Administered 2016-05-03: 650 mg via ORAL
  Filled 2016-05-03: qty 2

## 2016-05-03 NOTE — ED Notes (Signed)
MD at bedside. 

## 2016-05-03 NOTE — ED Notes (Signed)
Patient transported to X-ray 

## 2016-05-03 NOTE — ED Notes (Signed)
Pt returned from xray

## 2016-05-03 NOTE — ED Provider Notes (Signed)
CSN: [id number]     Arrival date & time 05/03/16  2057 History  By signing my name below, I, Dyke Brackett, attest that this documentation has been prepared under the direction and in the presence of Sherwood Gambler, MD . Electronically Signed: Dyke Brackett, Scribe. 05/03/2016. 10:33 PM.   No chief complaint on file.  The history is provided by the patient. No language interpreter was used.  HPI Comments:  Tanya Peterson is a 55 y.o. female who presents to the Emergency Department after experiencing sudden onset, fast palpitations that began about 1 hour ago. Pt ate pasta sauce before experiencing her pulse increase to 122. She also notes a sudden onset, dull, 7/10, pain in her left upper arm, left sided neck pain, and increased thirstiness that began around that same time. Pt had pain in her right back that has since resolved. She also felt short of breath on her way to the ED. Pt has had similar heart racing with reaction to Tyramine in the past and believes her pasta sauce may have contained Tyramine. She has never had similar pain in her upper arm or neck. Pt took 1 Ativan PTA with no relief. She also mentions having unrelated left thigh pain for the past week but attributes this to using a knee scooter after a left ankle injury. Pt is a non smoker. Denies chest pain, nausea, vomiting, diaphoresis, abdominal pain, dental pain, or any other associated symptoms.   Past Medical History  Diagnosis Date  . ADHD (attention deficit hyperactivity disorder)   . Asthma     rarely uses inhaler  . Depression   . History of kidney stones   . Sleep disorder     tx with nuvigal  . Anxiety attack   . Panic attacks    Past Surgical History  Procedure Laterality Date  . Liposuction    . Wisdom tooth extraction    . Colonoscopy with propofol N/A 01/11/2015    Procedure: COLONOSCOPY WITH PROPOFOL;  Surgeon: Garlan Fair, MD;  Location: [facility];  Service: Endoscopy;  Laterality: N/A;  . Kdiney stone  surgery  12/2015  . Hysteroscopy w/d&c N/A 03/05/2016    Procedure: DILATATION AND CURETTAGE /HYSTEROSCOPY;  Surgeon: Vanessa Kick, MD;  Location: [facility];  Service: Gynecology;  Laterality: N/A;   No family history on file. Social History  Substance Use Topics  . Smoking status: Never Smoker   . Smokeless tobacco: Never Used  . Alcohol Use: No   OB History    No data available     Review of Systems  Constitutional: Negative for fever and diaphoresis.  HENT: Negative for dental problem.   Cardiovascular: Positive for palpitations. Negative for chest pain.  Gastrointestinal: Negative for nausea, vomiting and abdominal pain.  Musculoskeletal: Positive for back pain, arthralgias and neck pain.  All other systems reviewed and are negative.     Allergies  Review of patient's allergies indicates no known allergies.  Home Medications   Prior to Admission medications   Medication Sig Start Date End Date Taking? Authorizing Provider  albuterol (PROVENTIL HFA;VENTOLIN HFA) 108 (90 BASE) MCG/ACT inhaler Inhale 1 puff into the lungs every 30 (thirty) minutes as needed for wheezing or shortness of breath.    Historical Provider, MD  amphetamine-dextroamphetamine (ADDERALL XR) 10 MG 24 hr capsule Take 10 mg by mouth daily.    Historical Provider, MD  Armodafinil (NUVIGIL) 250 MG tablet Take 250 mg by mouth daily.    Historical Provider, MD  aspirin  EC 81 MG tablet Take 1 tablet (81 mg total) by mouth daily. 04/27/16   Margarita Mail, PA-C  ibuprofen (ADVIL,MOTRIN) 600 MG tablet Take 1 tablet (600 mg total) by mouth every 6 (six) hours as needed. 03/05/16   Vanessa Kick, MD  LORazepam (ATIVAN) 1 MG tablet Take 1 tablet (1 mg total) by mouth 3 (three) times daily as needed for anxiety. 08/29/15   Kristen N Ward, DO  methylPREDNISolone (MEDROL DOSEPAK) 4 MG TBPK tablet Take as directed 03/09/16   Trula Slade, DPM  oxyCODONE-acetaminophen (ROXICET) 5-325 MG tablet Take 1-2 tablets by mouth every 4  (four) hours as needed. 03/05/16   Vanessa Kick, MD   BP 124/69 mmHg  Pulse 104  Temp(Src) 98.6 F (37 C) (Oral)  Resp 14  Ht 5\' 4"  (1.626 m)  Wt 165 lb (74.844 kg)  BMI 28.31 kg/m2  SpO2 98% Physical Exam  Constitutional: She is oriented to person, place, and time. She appears well-developed and well-nourished.  HENT:  Head: Normocephalic and atraumatic.  Right Ear: External ear normal.  Left Ear: External ear normal.  Nose: Nose normal.  Eyes: Right eye exhibits no discharge. Left eye exhibits no discharge.  Neck:    Cardiovascular: Normal rate, regular rhythm and normal heart sounds.   Pulses:      Radial pulses are 2+ on the right side, and 2+ on the left side.  Pulmonary/Chest: Effort normal and breath sounds normal.  Abdominal: Soft. There is no tenderness.  Musculoskeletal:       Arms: Neurological: She is alert and oriented to person, place, and time.  Skin: Skin is warm and dry.  Nursing note and vitals reviewed.   ED Course  Procedures  DIAGNOSTIC STUDIES: Oxygen Saturation is 98% on RA, normal by my interpretation.    COORDINATION OF CARE: 10:18 PM Discussed treatment plan which includes CXR, Troponin, CBC, D-dimer, and BMP with pt at bedside and pt agreed to plan.  Labs Review Labs Reviewed  BASIC METABOLIC PANEL - Abnormal; Notable for the following:    Glucose, Bld 133 (*)    All other components within normal limits  TROPONIN I  CBC WITH DIFFERENTIAL/PLATELET  D-DIMER, QUANTITATIVE (NOT AT [facility])  TROPONIN I    Imaging Review Dg Chest 2 View  05/03/2016  CLINICAL DATA:  Severe left neck and arm pain. EXAM: CHEST  2 VIEW COMPARISON:  08/29/2015 FINDINGS: Both lungs are clear. Heart and mediastinum are within normal limits. The trachea is midline. Mild degenerative changes in the thoracic spine. No pleural effusions. IMPRESSION: No active cardiopulmonary disease. Electronically Signed   By: Markus Daft M.D.   On: 05/03/2016 22:26   I have personally  reviewed and evaluated these images and lab results as part of my medical decision-making.   EKG Interpretation   Date/Time:  Thursday May 03 2016 21:03:53 EDT Ventricular Rate:  118 PR Interval:  130 QRS Duration: 80 QT Interval:  312 QTC Calculation: 437 R Axis:   48 Text Interpretation:  Sinus tachycardia Possible Left atrial enlargement  Nonspecific ST abnormality Abnormal ECG Confirmed by Yasmina Chico MD, Jillian Pianka  [phone]) on 05/03/2016 9:07:26 PM      ED ECG REPORT   Date: 05/03/2016  Rate: 93  Rhythm: normal sinus rhythm  QRS Axis: normal  Intervals: normal  ST/T Wave abnormalities: normal  Conduction Disutrbances:none  Narrative Interpretation:   Old EKG Reviewed: unchanged  I have personally reviewed the EKG tracing and agree with the computerized printout as noted.  MDM   Final diagnoses:  Sinus tachycardia (HCC)  Dyspnea    Initial symptoms possibly tyramine related as she's had palpitations, dyspnea before with this. Tachycardia has improved. Initial nonspecific ECG findings likely related to tachycardia. Repeat ECG benign. No chest pain. Left arm and neck pain are atypical and point tender. I have low suspicion for ACS. PE is low risk too, and given negative ddimer with no other objective findings I think no further workup indicated. Given abrupt symptoms tonight, will get 2nd troponin and likely discharge. Care to Dr. Roxanne Mins with 2nd troponin pending. Discussed return precautions with patient.   I personally performed the services described in this documentation, which was scribed in my presence. The recorded information has been reviewed and is accurate.     Sherwood Gambler, MD 05/04/16 [phone]

## 2016-05-03 NOTE — ED Notes (Addendum)
States she took an Product manager because she felt her pulse was starting to get fast. She had a sharp pain in her left arm and sharp pain in her neck. She drove herself here. Hyperventilating at triage.

## 2016-05-04 LAB — TROPONIN I

## 2016-05-04 NOTE — ED Notes (Signed)
MD at bedside. 

## 2016-05-04 NOTE — ED Provider Notes (Signed)
Patient initially seen and evaluated by Dr. Regenia Skeeter, signed out to me to evaluate for a delta troponin. Repeat troponin has come back normal. Patient has been symptom-free in the ED. She is asking some questions about how to determine if she had plaque or blockages and I have advised her that cardiology evaluation might be indicated. This can be arranged through her PCP.  Tanya Fuel, MD [id number]

## 2016-05-14 ENCOUNTER — Encounter: Payer: Self-pay | Admitting: Podiatry

## 2016-05-14 ENCOUNTER — Ambulatory Visit (INDEPENDENT_AMBULATORY_CARE_PROVIDER_SITE_OTHER): Payer: Managed Care, Other (non HMO) | Admitting: Podiatry

## 2016-05-14 DIAGNOSIS — T148 Other injury of unspecified body region: Secondary | ICD-10-CM | POA: Diagnosis not present

## 2016-05-14 DIAGNOSIS — M7662 Achilles tendinitis, left leg: Secondary | ICD-10-CM | POA: Diagnosis not present

## 2016-05-14 DIAGNOSIS — T148XXA Other injury of unspecified body region, initial encounter: Secondary | ICD-10-CM

## 2016-05-14 DIAGNOSIS — M25372 Other instability, left ankle: Secondary | ICD-10-CM | POA: Diagnosis not present

## 2016-05-14 NOTE — Progress Notes (Signed)
Patient ID: Tanya Peterson, female   DOB: 12/01/1960, 55 y.o.   MRN: [id number]  Subjective: 55 year old female presents the office today for follow-up evaluation left ankle pain. She has been wearing the boot and this has helped although she goes without the boot she continues to have pain to the outside aspect of her left ankle. She says the swelling has improved. No recent injury. No other complaints at this time.  Objective: AAO x3, NAD DP/PT pulses palpable bilaterally, CRT less than 3 seconds This continuation of tenderness on the lateral aspect of the left ankle along the course of the peroneal tendons tenderness localized edema to this area  However the edema as well as the pain does appear to be decreased compared to last appointment. Mild discomfort on the course the ATFL the left ankle. Mild edema over this area.There is no discomfort with subtalar joint range of motion however there is no crepitation or restriction. There is decreased tenderness on the posterior aspect the calcaneus on the insertion of the Achilles tendon and Thompson test is negative there is no defect noted within the Achilles tendon. There is no other areas tenderness bilaterally. There is no edema otherwise or increase in warmth to bilateral lower extremities.  No open lesions or pre-ulcerative lesions.  No pain with calf compression, swelling, warmth, erythema  Assessment: 55 year old female left peroneal tendon tear/chronic lateral ankle instability, Achilles tendinitis   Plan: -All treatment options discussed with the patient including all alternatives, risks, complications.  -At this time a discussed both conservative and surgical treatment options. She was hesitant on surgery. We will start with physical therapy for couple weeks to see how this does. A prescription was provided today. -Continue to the cam boot for now. After starting physical therapy once or symptoms subside can start to go to a regular shoe with an  ankle brace before going to a regular shoe. If symptoms continue discussed the peroneal tendon repair and lateral ankle stabilization as is the majority of symptoms are localized. Also discussed with her that without surgery to the tear could become worse.  IMPRESSION: 1. The longitudinal tearing of the peroneus brevis and tendinopathy and likely partially tear peroneus longus, with extensive peroneus tendon sheath tenosynovitis and overlying subcutaneous edema. This corresponds to the marked site of the patient's symptoms. 2. Minimal distal Achilles tendinopathy with a small amount of edema posterior to the distal Achilles insertion. 3. Mild plantar fasciitis proximally involving the medial band. 4. Mild Haglund deformity. 5. Thickened and indistinct anterior talofibular ligament suggesting sprain. 6. Posterior effusion of the posterior subtalar facet. 7. Small geode or degenerative subcortical cystic lesion along the articular margin of the medial malleolus.  Celesta Gentile, DPM

## 2016-06-01 DIAGNOSIS — I773 Arterial fibromuscular dysplasia: Secondary | ICD-10-CM | POA: Insufficient documentation

## 2016-06-04 ENCOUNTER — Ambulatory Visit (INDEPENDENT_AMBULATORY_CARE_PROVIDER_SITE_OTHER): Payer: Managed Care, Other (non HMO) | Admitting: Podiatry

## 2016-06-04 ENCOUNTER — Encounter: Payer: Self-pay | Admitting: Podiatry

## 2016-06-04 DIAGNOSIS — T148XXA Other injury of unspecified body region, initial encounter: Secondary | ICD-10-CM

## 2016-06-04 DIAGNOSIS — T148 Other injury of unspecified body region: Secondary | ICD-10-CM

## 2016-06-04 DIAGNOSIS — M25372 Other instability, left ankle: Secondary | ICD-10-CM

## 2016-06-04 DIAGNOSIS — M7662 Achilles tendinitis, left leg: Secondary | ICD-10-CM | POA: Diagnosis not present

## 2016-06-04 MED ORDER — ENOXAPARIN SODIUM 40 MG/0.4ML ~~LOC~~ SOLN: 40.0000 mg | SUBCUTANEOUS | Status: DC

## 2016-06-04 NOTE — Patient Instructions (Signed)
I ordered Lovenox for you. Bring this with you to the surgery. Do NOT start this medication until after surgery  Pre-Operative Instructions  Congratulations, you have decided to take an important step to improving your quality of life.  You can be assured that the doctors of [facility] will be with you every step of the way.  1. Plan to be at the surgery center/hospital at least 1 (one) hour prior to your scheduled time unless otherwise directed by the surgical center/hospital staff.  You must have a responsible adult accompany you, remain during the surgery and drive you home.  Make sure you have directions to the surgical center/hospital and know how to get there on time. 2. For hospital based surgery you will need to obtain a history and physical form from your family physician within 1 month prior to the date of surgery- we will give you a form for you primary physician.  3. We make every effort to accommodate the date you request for surgery.  There are however, times where surgery dates or times have to be moved.  We will contact you as soon as possible if a change in schedule is required.   4. No Aspirin/Ibuprofen for one week before surgery.  If you are on aspirin, any non-steroidal anti-inflammatory medications (Mobic, Aleve, Ibuprofen) you should stop taking it 7 days prior to your surgery.  You make take Tylenol  For pain prior to surgery.  5. Medications- If you are taking daily heart and blood pressure medications, seizure, reflux, allergy, asthma, anxiety, pain or diabetes medications, make sure the surgery center/hospital is aware before the day of surgery so they may notify you which medications to take or avoid the day of surgery. 6. No food or drink after midnight the night before surgery unless directed otherwise by surgical center/hospital staff. 7. No alcoholic beverages 24 hours prior to surgery.  No smoking 24 hours prior to or 24 hours after surgery. 8. Wear loose pants or  shorts- loose enough to fit over bandages, boots, and casts. 9. No slip on shoes, sneakers are best. 10. Bring your boot with you to the surgery center/hospital.  Also bring crutches or a walker if your physician has prescribed it for you.  If you do not have this equipment, it will be provided for you after surgery. 11. If you have not been contracted by the surgery center/hospital by the day before your surgery, call to confirm the date and time of your surgery. 12. Leave-time from work may vary depending on the type of surgery you have.  Appropriate arrangements should be made prior to surgery with your employer. 13. Prescriptions will be provided immediately following surgery by your doctor.  Have these filled as soon as possible after surgery and take the medication as directed. 14. Remove nail polish on the operative foot. 15. Wash the night before surgery.  The night before surgery wash the foot and leg well with the antibacterial soap provided and water paying special attention to beneath the toenails and in between the toes.  Rinse thoroughly with water and dry well with a towel.  Perform this wash unless told not to do so by your physician.  Enclosed: 1 Ice pack (please put in freezer the night before surgery)   1 Hibiclens skin cleaner   Pre-op Instructions  If you have any questions regarding the instructions, do not hesitate to call our office at any point during this process.   [place]: [place]  [place], [place] [address] [place]: [address], [place], [place] [address] [phone]  Dr. Celesta Gentile, DPM

## 2016-06-05 NOTE — Progress Notes (Signed)
Patient ID: Tanya Peterson, female   DOB: 08/11/1961, 55 y.o.   MRN: [id number]  Subjective: 55 year old female presents the office today for follow-up evaluation left ankle pain. She has undergone physical therapy and she has not feel that she has made much improvement. She still has pain when walking and she still has swelling. At this time should go ahead and proceed with surgical intervention as she has not had much progress. No other complaints at this time in no acute changes otherwise.  Objective: AAO x3, NAD DP/PT pulses palpable bilaterally, CRT less than 3 seconds This continuation of tenderness on the lateral aspect of the left ankle along the course of the peroneal tendons tenderness localized edema to this area. She has has difficulty with eversion of the foot as well as abduction. There is also tenderness and mild swelling on the course the ATFL. There is maybe a mild increase in anterior drawer compared to the contralateral extremity. No area pinpoint bony tenderness pain vibratory sensation. She has some mild discomfort along the course the Achilles tendon but this is much improved. No other areas of tenderness at this time. MMT 5/5 except along the course of the peroneal which is decreased. No open lesions or pre-ulcerative lesions.  No pain with calf compression, swelling, warmth, erythema  Assessment: 55 year old female left peroneal tendon tear/chronic lateral ankle instability, Achilles tendinitis   Plan: -All treatment options discussed with the patient including all alternatives, risks, complications.  -At this time she has been immobilized in a cam boot she has been doing physical therapy as well as anti-inflammatories and shoe changes without any relief of symptoms. At this time showed the go ahead and proceed with surgical intervention. Discussed with her peroneal tendon appear as well as ATFL/lateral ankle stabilization. She wishes to proceed. Although she was referred to have  pain along the Achilles tendon this is actually improved. I would hold off on that surgery at this time. -The incision placement as well as the postoperative course was discussed with the patient. I discussed risks of the surgery which include, but not limited to, infection, bleeding, pain, swelling, need for further surgery, delayed or nonhealing, painful or ugly scar, numbness or sensation changes, over/under correction, recurrence, transfer lesions, further deformity, hardware failure, DVT/PE, loss of toe/foot. Patient understands these risks and wishes to proceed with surgery. The surgical consent was reviewed with the patient all 3 pages were signed. No promises or guarantees were given to the outcome of the procedure. All questions were answered to the best of my ability. Before the surgery the patient was encouraged to call the office if there is any further questions. The surgery will be performed at the [facility] on an outpatient basis. -Lovenox postop. This was ordered today.  Celesta Gentile, DPM

## 2016-06-08 ENCOUNTER — Telehealth: Payer: Self-pay | Admitting: *Deleted

## 2016-06-08 NOTE — Telephone Encounter (Signed)
"  I have a surgery date of August 2nd for my foot.  I need to know a time and I was told you are that person that can help me.  Please give me a call."

## 2016-06-08 NOTE — Telephone Encounter (Signed)
I'm returning your call.  You will receive a call from someone from the surgical center a day or two prior to surgery date with the arrival time.  "Oh okay, I was just trying to be pro-active and finding myself a ride.  Who do I need to speak to regarding FMLA, Dr. Leigh Aurora nurse, Lattie Haw?"  I am not sure it may be her or Marylou Mccoy.  "Can I speak to one of them now?"  They are not here at this time, our office is closed.  "Okay, thanks for your help.  I will call on Monday."

## 2016-06-11 ENCOUNTER — Telehealth: Payer: Self-pay | Admitting: *Deleted

## 2016-06-11 NOTE — Telephone Encounter (Addendum)
Pt states her employer would like her 07/08/2016 surgery paperwork as soon as possible. 06/18/2016-Pt called for her pain medication prior to surgery.  I told pt she would have 4-6 hours of anesthesia and would be able to send someone to pick up her pain medication after the surgery. 06/21/2016-Post op courtesy call-Pt states she is doing fine and the foot is tingling like the numbness is wearing off, and she has begun the pain medication.  I encouraged the pt to read the Post op Instruction sheet, reminded pt even in the cast and using crutches, knee scooter, or rolling walker her foot was still below her heart, and could increase the chances of swelling, pain and inflammation, I told pt to keep cast clean and dry and call with concerns.

## 2016-06-12 NOTE — Telephone Encounter (Signed)
[facility], it was in an envelope and I laid it on [place]. I will texted Janett Billow and see. Lattie Haw

## 2016-06-12 NOTE — Telephone Encounter (Signed)
Pt called stated she personally took her FMLA paperwork to [place] last week and her work is asking her about it. They have not received it. [facility] does not have it.

## 2016-06-14 DIAGNOSIS — M7662 Achilles tendinitis, left leg: Secondary | ICD-10-CM

## 2016-06-15 DIAGNOSIS — M7662 Achilles tendinitis, left leg: Secondary | ICD-10-CM

## 2016-06-20 DIAGNOSIS — M7662 Achilles tendinitis, left leg: Secondary | ICD-10-CM | POA: Diagnosis not present

## 2016-06-20 DIAGNOSIS — T148 Other injury of unspecified body region: Secondary | ICD-10-CM | POA: Diagnosis not present

## 2016-06-20 DIAGNOSIS — M25372 Other instability, left ankle: Secondary | ICD-10-CM | POA: Diagnosis not present

## 2016-06-20 HISTORY — PX: TENDON REPAIR: SHX5111

## 2016-06-27 ENCOUNTER — Encounter: Payer: Self-pay | Admitting: Podiatry

## 2016-06-27 ENCOUNTER — Ambulatory Visit (INDEPENDENT_AMBULATORY_CARE_PROVIDER_SITE_OTHER): Payer: Managed Care, Other (non HMO) | Admitting: Podiatry

## 2016-06-27 DIAGNOSIS — T148 Other injury of unspecified body region: Secondary | ICD-10-CM

## 2016-06-27 DIAGNOSIS — M25372 Other instability, left ankle: Secondary | ICD-10-CM

## 2016-06-27 DIAGNOSIS — Z9889 Other specified postprocedural states: Secondary | ICD-10-CM

## 2016-06-27 DIAGNOSIS — T148XXA Other injury of unspecified body region, initial encounter: Secondary | ICD-10-CM

## 2016-06-27 MED ORDER — HYDROMORPHONE HCL 2 MG PO TABS: 2.0000 mg | ORAL_TABLET | ORAL | 0 refills | Status: DC | PRN

## 2016-06-27 NOTE — Patient Instructions (Signed)
Venous Thromboembolism Prevention Venous thromboembolism (VTE) is a condition in which a blood clot (thrombus) develops in the body. A thrombus usually occurs in a deep vein in the leg or the pelvis (DVT), but it can also occur in the arm. Sometimes, pieces of a thrombus can break off from its original place of development and travel through the bloodstream to other parts of the body. When that happens, the thrombus is called an embolus. An embolus that travels to one or both lungs is called a pulmonary embolism. An embolism can block the blood flow in the blood vessels of other organs as well. VTE is a serious health condition that can cause disability or death. It is very important to get help right away and to not ignore symptoms. HOW CAN A VTE BE PREVENTED?  Exercise regularly. Take a brisk 30 minute walk every day. Staying active and moving around can help you to prevent blood clots.  Avoid sitting or lying in bed for long periods of time without moving your legs. Change your position often, especially during long-distance travel (over 4 hours).  If you are a woman who is over 53 years of age, avoid unnecessary use of medicines that contain estrogen. These include birth control pills and hormone replacement therapy.  Do not smoke, especially if you take estrogen medicines. If you need help quitting, ask your health care provider.  Eat plenty of fruits and vegetables. Ask your health care provider or dietitian if there are foods that you should avoid.  Maintain a weight that is appropriate for your height. Ask your health care provider what weight is healthy for you.  Wear loose-fitting clothing. Avoid constrictive or tight clothing around your legs or waist.  Try not to bump or injure your legs. Avoid crossing your legs when you are sitting.  Do not use pillows under your knees while lying down unless told by your health care provider.  Wear support hose (compression stockings or TED  hose) as told by your health care provider Compression stockings increase blood flow in your legs and can help prevent blood clots. Do not let them bunch up when you are wearing them. HOW CAN I PREVENT VTE WHEN I TRAVEL? Long-distance travel (over 4 hours) can increase the risk of a VTE. To prevent VTE when traveling:  Exercise your legs every hour by standing, stretching, and bending and straightening your legs. If you are traveling by airplane, train, or bus, walk up and down the aisle as often as possible to get your blood moving. If you are traveling by car, stop and get out of the car every hour to exercise your legs and stretch. Other types of exercise might include:  Keeping your feet flat on the ground and raising your toes.  Switching from tightening the muscles in your calves and thighs to relaxing those same muscles while you are sitting.  Pointing and flexing your feet at the ankle joints while you are sitting.  Stay well hydrated while traveling. Drink enough water to keep your urine clear or pale yellow.  Avoid drinking alcohol during long travel. Generally, it is not recommended that you take medicines to prevent DVT during routine travel. HOW CAN VTE BE PREVENTED IF I AM HOSPITALIZED? A VTE may be prevented by taking medicines that are prescribed to prevent blood clots (anticoagulants). You can also help to prevent VTE while in the hospital by taking these actions:  Get out of bed and walk. Ask your health care provider  if this is safe for you to do.  Request the use of a sequential compression device (SCD). This is a machine that pumps air into compression sleeves that are wrapped around your legs.  Request the use of compression stockings, which are tight, elastic stockings that apply pressure to the lower legs. Compression stockings are sometimes used with SCDs. HOW CAN I PREVENT VTE AFTER SURGERY? Understand that there is an increased risk for VTE for the first 4-6 weeks  after surgery. During this time:  Avoid long-distance travel (over 4 hours). If you must travel during this time, ask your health care provider about additional preventive actions that you can take. These might include exercising your arms and legs every hour while you travel.  Avoid sitting or lying still for too long. If possible, get up and walk around one time every hour. Ask your health care provider when this is safe for you to do. SEEK IMMEDIATE MEDICAL CARE IF:  You have new or increased pain, swelling, or redness in an arm or leg.  You have numbness or tingling in an arm or leg.  You have shortness of breath while active or at rest.  You have chest pain.  You have a rapid or irregular heartbeat.  You feel light-headed or dizzy.  You cough up blood.  You notice blood in your vomit, bowel movement, or urine. These symptoms may represent a serious problem that is an emergency. Do not wait to see if the symptoms will go away. Get medical help right away. Call your local emergency services (911 in the [place] Do not drive yourself to the hospital.   This information is not intended to replace advice given to you by your health care provider. Make sure you discuss any questions you have with your health care provider.   Document Released: 10/24/2009 Document Revised: 07/27/2015 Document Reviewed: 03/02/2015 Elsevier Interactive Patient Education [employer].

## 2016-06-28 ENCOUNTER — Other Ambulatory Visit: Payer: Self-pay

## 2016-06-28 NOTE — Progress Notes (Signed)
Subjective: Tanya Peterson is a 55 y.o. is seen today in office s/p left peroneal tendon repair and ATFL repair preformed on 06/20/16. She feels that the cast is tight and to manage rubbing on the outside aspect of her ankle. She's been taking pain medicine every 4 hours and is asking for refill today. She has tried to stay nonweightbearing as much as possible. She is continuing with Lovenox. Denies any systemic complaints such as fevers, chills, nausea, vomiting. No calf pain, chest pain, shortness of breath.   Objective: General: No acute distress, AAOx3  DP/PT pulses palpable 2/4, CRT < 3 sec to all digits.  Protective sensation intact. Motor function intact.  Cast is clean, dry, intact. Left foot: Incision is well coapted without any evidence of dehiscence and staples are intact. There is faint erythema dropping on the incision likely from inflammation. There is no increase in warmth or ascending synovitis. No drainage or pus in the incision. There is mild topalpation on surgical sites but no other areas of tenderness. There is mild edema on surgical site as well. There is no fluctuance or crepitus. There is no clinical signs of infection otherwise.  No other areas of tenderness to bilateral lower extremities.  No other open lesions or pre-ulcerative lesions.  No pain with calf compression, swelling, warmth, erythema.   Assessment and Plan:  Status post left ankle surgery, doing well with no complications   -Treatment options discussed including all alternatives, risks, and complications -Cast was removed and the incision was evaluated. Antibiotic was applied followed by dressing. Given swelling and pain is was placed into a well-padded below-knee posterior splint. Continue nonweightbearing at all times. Pain medication as needed. I prescribed dilaudid 2 mg today. -Ice/elevation -Finish antibiotic -Monitor for any clinical signs or symptoms of infection and DVT/PE and directed to call the office  immediately should any occur or go to the ER. -Follow-up in 1 week or sooner if any problems arise. In the meantime, encouraged to call the office with any questions, concerns, change in symptoms.   Celesta Gentile, DPM

## 2016-06-29 ENCOUNTER — Telehealth: Payer: Self-pay | Admitting: Podiatry

## 2016-06-29 NOTE — Telephone Encounter (Signed)
Pt returned your call and was not sure exactly what you wanted. She did say Dr Jacqualyn Posey was to be faxing the note for her short term disability. If that is what was needed you could fax it to [facility] fax # [phone] with pts name,DoB and claim # [id number]. If you could please just let pt know if there was something else needed.

## 2016-07-04 ENCOUNTER — Encounter: Payer: Self-pay | Admitting: Podiatry

## 2016-07-04 ENCOUNTER — Ambulatory Visit (INDEPENDENT_AMBULATORY_CARE_PROVIDER_SITE_OTHER): Payer: Managed Care, Other (non HMO) | Admitting: Podiatry

## 2016-07-04 DIAGNOSIS — M25372 Other instability, left ankle: Secondary | ICD-10-CM

## 2016-07-04 DIAGNOSIS — Z9889 Other specified postprocedural states: Secondary | ICD-10-CM | POA: Diagnosis not present

## 2016-07-04 MED ORDER — OXYCODONE-ACETAMINOPHEN 5-325 MG PO TABS: 1.0000 | ORAL_TABLET | ORAL | 0 refills | Status: DC | PRN

## 2016-07-04 MED ORDER — CEPHALEXIN 500 MG PO CAPS: 500.0000 mg | ORAL_CAPSULE | Freq: Three times a day (TID) | ORAL | 2 refills | Status: DC

## 2016-07-04 NOTE — Progress Notes (Signed)
Subjective: Jaclynne Martinka is a 55 y.o. is seen today in office s/p left peroneal tendon repair and ATFL repair preformed on 06/20/16. She states that she still is in some discomfort. She is to remain nonweightbearing as much as possible. She's been wearing the posterior splint as well as using crutches. Denies any systemic complaints such as fevers, chills, nausea, vomiting. No calf pain, chest pain, shortness of breath.   Objective: General: No acute distress, AAOx3  DP/PT pulses palpable 2/4, CRT < 3 sec to all digits.  Protective sensation intact. Motor function intact.  Cast is clean, dry, intact. Left foot: Incision is well coapted without any evidence of dehiscence and staples are intact. Slight erythema around the incision today and there is multiple small bumps present without any drainage or fluid. There is no drainage or pus coming from the incision and it remains coapted. There is no ascending synovitis. There is no fluctuance or crepitus. There is no malodor. Tenderness palpation of the surgical site. Other areas of tenderness at this time. No other open lesions or pre-ulcerative lesions.  No pain with calf compression, swelling, warmth, erythema.   Assessment and Plan:  Status post left ankle surgery, doing well with no complications   -Treatment options discussed including all alternatives, risks, and complications -Bandage was removed today. Staples were removed every other one. Small amount of Betadine was painted over the incision followed by dry sterile dressing. The dressing clean, dry, intact. She doesn't history of cellulitis. Because of this we will continue antibiotics. Refilled Keflex today. Given possible early infection" this into a cam boot today so that when she can monitor the area. She said that she does not want a cast is shifted to ice the area as well. Cam boot was dispensed . -ASA daily. Continue to monitor for any signs or symptoms of DVT/PE and directed to go to the  ER should any occur.  -Follow-up in 10 days or sooner to arise. Call any questions or concerns the meantime. Monitor for any signs or symptoms of infection at call the office immediately should any occur or gets the ER. -Continue to recommend her to hold off on going back to work at this time given that she is still nonweightbearing and postoperative healing.  Celesta Gentile, DPM

## 2016-07-04 NOTE — Patient Instructions (Signed)
Venous Thromboembolism Prevention Venous thromboembolism (VTE) is a condition in which a blood clot (thrombus) develops in the body. A thrombus usually occurs in a deep vein in the leg or the pelvis (DVT), but it can also occur in the arm. Sometimes, pieces of a thrombus can break off from its original place of development and travel through the bloodstream to other parts of the body. When that happens, the thrombus is called an embolus. An embolus that travels to one or both lungs is called a pulmonary embolism. An embolism can block the blood flow in the blood vessels of other organs as well. VTE is a serious health condition that can cause disability or death. It is very important to get help right away and to not ignore symptoms. HOW CAN A VTE BE PREVENTED?  Exercise regularly. Take a brisk 30 minute walk every day. Staying active and moving around can help you to prevent blood clots.  Avoid sitting or lying in bed for long periods of time without moving your legs. Change your position often, especially during long-distance travel (over 4 hours).  If you are a woman who is over 53 years of age, avoid unnecessary use of medicines that contain estrogen. These include birth control pills and hormone replacement therapy.  Do not smoke, especially if you take estrogen medicines. If you need help quitting, ask your health care provider.  Eat plenty of fruits and vegetables. Ask your health care provider or dietitian if there are foods that you should avoid.  Maintain a weight that is appropriate for your height. Ask your health care provider what weight is healthy for you.  Wear loose-fitting clothing. Avoid constrictive or tight clothing around your legs or waist.  Try not to bump or injure your legs. Avoid crossing your legs when you are sitting.  Do not use pillows under your knees while lying down unless told by your health care provider.  Wear support hose (compression stockings or TED  hose) as told by your health care provider Compression stockings increase blood flow in your legs and can help prevent blood clots. Do not let them bunch up when you are wearing them. HOW CAN I PREVENT VTE WHEN I TRAVEL? Long-distance travel (over 4 hours) can increase the risk of a VTE. To prevent VTE when traveling:  Exercise your legs every hour by standing, stretching, and bending and straightening your legs. If you are traveling by airplane, train, or bus, walk up and down the aisle as often as possible to get your blood moving. If you are traveling by car, stop and get out of the car every hour to exercise your legs and stretch. Other types of exercise might include:  Keeping your feet flat on the ground and raising your toes.  Switching from tightening the muscles in your calves and thighs to relaxing those same muscles while you are sitting.  Pointing and flexing your feet at the ankle joints while you are sitting.  Stay well hydrated while traveling. Drink enough water to keep your urine clear or pale yellow.  Avoid drinking alcohol during long travel. Generally, it is not recommended that you take medicines to prevent DVT during routine travel. HOW CAN VTE BE PREVENTED IF I AM HOSPITALIZED? A VTE may be prevented by taking medicines that are prescribed to prevent blood clots (anticoagulants). You can also help to prevent VTE while in the hospital by taking these actions:  Get out of bed and walk. Ask your health care provider  if this is safe for you to do.  Request the use of a sequential compression device (SCD). This is a machine that pumps air into compression sleeves that are wrapped around your legs.  Request the use of compression stockings, which are tight, elastic stockings that apply pressure to the lower legs. Compression stockings are sometimes used with SCDs. HOW CAN I PREVENT VTE AFTER SURGERY? Understand that there is an increased risk for VTE for the first 4-6 weeks  after surgery. During this time:  Avoid long-distance travel (over 4 hours). If you must travel during this time, ask your health care provider about additional preventive actions that you can take. These might include exercising your arms and legs every hour while you travel.  Avoid sitting or lying still for too long. If possible, get up and walk around one time every hour. Ask your health care provider when this is safe for you to do. SEEK IMMEDIATE MEDICAL CARE IF:  You have new or increased pain, swelling, or redness in an arm or leg.  You have numbness or tingling in an arm or leg.  You have shortness of breath while active or at rest.  You have chest pain.  You have a rapid or irregular heartbeat.  You feel light-headed or dizzy.  You cough up blood.  You notice blood in your vomit, bowel movement, or urine. These symptoms may represent a serious problem that is an emergency. Do not wait to see if the symptoms will go away. Get medical help right away. Call your local emergency services (911 in the [place] Do not drive yourself to the hospital.   This information is not intended to replace advice given to you by your health care provider. Make sure you discuss any questions you have with your health care provider.   Document Released: 10/24/2009 Document Revised: 07/27/2015 Document Reviewed: 03/02/2015 Elsevier Interactive Patient Education [employer].

## 2016-07-05 ENCOUNTER — Observation Stay (HOSPITAL_BASED_OUTPATIENT_CLINIC_OR_DEPARTMENT_OTHER): Payer: Managed Care, Other (non HMO)

## 2016-07-05 ENCOUNTER — Observation Stay (HOSPITAL_COMMUNITY): Payer: Managed Care, Other (non HMO)

## 2016-07-05 ENCOUNTER — Emergency Department (HOSPITAL_BASED_OUTPATIENT_CLINIC_OR_DEPARTMENT_OTHER): Payer: Managed Care, Other (non HMO)

## 2016-07-05 ENCOUNTER — Observation Stay (HOSPITAL_BASED_OUTPATIENT_CLINIC_OR_DEPARTMENT_OTHER)
Admission: EM | Admit: 2016-07-05 | Discharge: 2016-07-05 | Disposition: A | Discharge status: Home / Self Care | Payer: Managed Care, Other (non HMO) | Attending: Emergency Medicine | Admitting: Emergency Medicine

## 2016-07-05 ENCOUNTER — Encounter (HOSPITAL_BASED_OUTPATIENT_CLINIC_OR_DEPARTMENT_OTHER): Payer: Self-pay | Admitting: Emergency Medicine

## 2016-07-05 ENCOUNTER — Observation Stay (HOSPITAL_BASED_OUTPATIENT_CLINIC_OR_DEPARTMENT_OTHER)
Admit: 2016-07-05 | Discharge: 2016-07-05 | Disposition: A | Discharge status: Home / Self Care | Payer: Managed Care, Other (non HMO) | Attending: Emergency Medicine | Admitting: Emergency Medicine

## 2016-07-05 DIAGNOSIS — I773 Arterial fibromuscular dysplasia: Secondary | ICD-10-CM | POA: Diagnosis not present

## 2016-07-05 DIAGNOSIS — G988 Other disorders of nervous system: Secondary | ICD-10-CM

## 2016-07-05 DIAGNOSIS — G8194 Hemiplegia, unspecified affecting left nondominant side: Secondary | ICD-10-CM

## 2016-07-05 DIAGNOSIS — M79609 Pain in unspecified limb: Secondary | ICD-10-CM

## 2016-07-05 DIAGNOSIS — F419 Anxiety disorder, unspecified: Secondary | ICD-10-CM | POA: Diagnosis not present

## 2016-07-05 DIAGNOSIS — G451 Carotid artery syndrome (hemispheric): Secondary | ICD-10-CM

## 2016-07-05 DIAGNOSIS — Z7982 Long term (current) use of aspirin: Secondary | ICD-10-CM | POA: Diagnosis not present

## 2016-07-05 DIAGNOSIS — R200 Anesthesia of skin: Principal | ICD-10-CM | POA: Insufficient documentation

## 2016-07-05 DIAGNOSIS — Z9889 Other specified postprocedural states: Secondary | ICD-10-CM

## 2016-07-05 DIAGNOSIS — R29818 Other symptoms and signs involving the nervous system: Secondary | ICD-10-CM

## 2016-07-05 DIAGNOSIS — G459 Transient cerebral ischemic attack, unspecified: Secondary | ICD-10-CM

## 2016-07-05 DIAGNOSIS — J45909 Unspecified asthma, uncomplicated: Secondary | ICD-10-CM | POA: Insufficient documentation

## 2016-07-05 DIAGNOSIS — M7989 Other specified soft tissue disorders: Secondary | ICD-10-CM | POA: Diagnosis not present

## 2016-07-05 DIAGNOSIS — Z7901 Long term (current) use of anticoagulants: Secondary | ICD-10-CM | POA: Diagnosis not present

## 2016-07-05 DIAGNOSIS — R202 Paresthesia of skin: Secondary | ICD-10-CM

## 2016-07-05 DIAGNOSIS — F909 Attention-deficit hyperactivity disorder, unspecified type: Secondary | ICD-10-CM | POA: Diagnosis present

## 2016-07-05 LAB — COMPREHENSIVE METABOLIC PANEL
ALT: 30 U/L (ref 14–54)
ANION GAP: 6 (ref 5–15)
AST: 21 U/L (ref 15–41)
Albumin: 4.4 g/dL (ref 3.5–5.0)
Alkaline Phosphatase: 78 U/L (ref 38–126)
BUN: 19 mg/dL (ref 6–20)
CALCIUM: 9.5 mg/dL (ref 8.9–10.3)
CHLORIDE: 105 mmol/L (ref 101–111)
CO2: 27 mmol/L (ref 22–32)
CREATININE: 0.64 mg/dL (ref 0.44–1.00)
Glucose, Bld: 103 mg/dL — ABNORMAL HIGH (ref 65–99)
Potassium: 3.7 mmol/L (ref 3.5–5.1)
Sodium: 138 mmol/L (ref 135–145)
Total Bilirubin: 0.5 mg/dL (ref 0.3–1.2)
Total Protein: 7.2 g/dL (ref 6.5–8.1)

## 2016-07-05 LAB — URINALYSIS, ROUTINE W REFLEX MICROSCOPIC
Bilirubin Urine: NEGATIVE
GLUCOSE, UA: NEGATIVE mg/dL
Hgb urine dipstick: NEGATIVE
Ketones, ur: NEGATIVE mg/dL
NITRITE: NEGATIVE
PROTEIN: NEGATIVE mg/dL
Specific Gravity, Urine: 1.009 (ref 1.005–1.030)
pH: 6 (ref 5.0–8.0)

## 2016-07-05 LAB — APTT: aPTT: 35 seconds (ref 24–36)

## 2016-07-05 LAB — DIFFERENTIAL
BASOS PCT: 1 %
Basophils Absolute: 0 10*3/uL (ref 0.0–0.1)
EOS ABS: 0.4 10*3/uL (ref 0.0–0.7)
EOS PCT: 5 %
Lymphocytes Relative: 37 %
Lymphs Abs: 2.8 10*3/uL (ref 0.7–4.0)
MONO ABS: 0.7 10*3/uL (ref 0.1–1.0)
MONOS PCT: 9 %
NEUTROS ABS: 3.6 10*3/uL (ref 1.7–7.7)
Neutrophils Relative %: 48 %

## 2016-07-05 LAB — RAPID URINE DRUG SCREEN, HOSP PERFORMED
Amphetamines: NOT DETECTED
BARBITURATES: NOT DETECTED
Benzodiazepines: NOT DETECTED
COCAINE: NOT DETECTED
Opiates: POSITIVE — AB
Tetrahydrocannabinol: NOT DETECTED

## 2016-07-05 LAB — CBC
HEMATOCRIT: 40.3 % (ref 36.0–46.0)
Hemoglobin: 13.7 g/dL (ref 12.0–15.0)
MCH: 31.4 pg (ref 26.0–34.0)
MCHC: 34 g/dL (ref 30.0–36.0)
MCV: 92.2 fL (ref 78.0–100.0)
Platelets: 443 10*3/uL — ABNORMAL HIGH (ref 150–400)
RBC: 4.37 MIL/uL (ref 3.87–5.11)
RDW: 12.5 % (ref 11.5–15.5)
WBC: 7.5 10*3/uL (ref 4.0–10.5)

## 2016-07-05 LAB — ECHOCARDIOGRAM COMPLETE
HEIGHTINCHES: 64 in
WEIGHTICAEL: 2672 [oz_av]

## 2016-07-05 LAB — URINE MICROSCOPIC-ADD ON: RBC / HPF: NONE SEEN RBC/hpf (ref 0–5)

## 2016-07-05 LAB — TROPONIN I

## 2016-07-05 LAB — ETHANOL

## 2016-07-05 LAB — PROTIME-INR
INR: 0.87
PROTHROMBIN TIME: 11.8 s (ref 11.4–15.2)

## 2016-07-05 MED ORDER — ASPIRIN EC 81 MG PO TBEC
81.0000 mg | DELAYED_RELEASE_TABLET | Freq: Every day | ORAL | Status: DC
  Administered 2016-07-05: 81 mg via ORAL
  Filled 2016-07-05: qty 1

## 2016-07-05 MED ORDER — STROKE: EARLY STAGES OF RECOVERY BOOK
Freq: Once | Status: DC
  Filled 2016-07-05: qty 1

## 2016-07-05 NOTE — ED Notes (Signed)
Patient transported to CT 

## 2016-07-05 NOTE — ED Notes (Signed)
Pt remains in MRI 

## 2016-07-05 NOTE — Discharge Instructions (Signed)
There is no evidence of stroke. Take an aspirin daily and followup with your neurologist. Return to the ED if you develop new or worsening symptoms.

## 2016-07-05 NOTE — ED Notes (Signed)
Pt was seen by Dr. Jacqualyn Posey at podiatrist office 12 hrs ago for surgical follow up. Was given refill rx for keflex.

## 2016-07-05 NOTE — ED Provider Notes (Addendum)
Patient transferred from [facility] [place] as a code stroke. Reports onset of left-sided numbness and paresthesias starting at 1:30 AM. Recent left tendon repair in the foot. She states that her symptoms wax and wane. She was seen and evaluated by neurology upon arrival. Doubt acute stroke and not a TPA candidate; however, will admit for TIA workup given persistent numbness and involvement of the left face, arm, and leg.   Merryl Hacker, MD 07/05/16 0502  Patient apparently needs only an echocardiogram to finish her workup. She Is done first thing in the morning and be discharged if reassuring. She can be discharged home on aspirin.  Hospitalist left a consult note.  Per neurology, patient does not need an MRI.   Merryl Hacker, MD 07/05/16 [phone]

## 2016-07-05 NOTE — ED Notes (Signed)
Pt updated on plan- pt to get echo this morning and D/C from ER depending on normal results

## 2016-07-05 NOTE — Progress Notes (Signed)
Echocardiogram 2D Echocardiogram has been performed.  Tanya Peterson 07/05/2016, 8:01 AM

## 2016-07-05 NOTE — Progress Notes (Signed)
VASCULAR LAB PRELIMINARY  PRELIMINARY  PRELIMINARY  PRELIMINARY  Left lower extremity venous duplex has been completed.     Left:  No evidence of DVT, superficial thrombosis, or Baker's cyst.  Called ed with results, spoke with Lenna Sciara, [place], RVT, RDMS 07/05/2016, 12:16 PM

## 2016-07-05 NOTE — ED Notes (Signed)
Pt c/o left shoulder and arm pain, nausea and left side of face feeling hot.

## 2016-07-05 NOTE — ED Notes (Signed)
Echo changed to stat per previous nurse for anticipated discharge

## 2016-07-05 NOTE — Consult Note (Signed)
Neurology Consult Note  Reason for Consultation: CODE STROKE  Requesting provider: Delora Fuel, MD  CC: Numbness L arm and leg  HPI: This is a 55 year old right-handed woman who is status post left peroneal tendon repair and ATFL repair on 06/20/16. She has been using a cam boot with posterior splint and crutches since her surgery with persistent discomfort in that foot. She reports that she took her Dilaudid at midnight and then went to bed. She woke up at 1:30 and noted some numbness in her left arm between the shoulder and the wrist and also below the knee and the left lower extremity. She also felt as if her left foot was being crushed. This is accompanied by some left shoulder pain and nausea. She lay in bed for a couple of minutes and then decided to get up to check her foot because she was concerned that it was swelling and that her boot may be too tight. She was able to get up and ambulate into the bathroom using her crutches and does not endorse that she have any left-sided weakness at this time. While in the bathroom, she noticed that the left side of her face felt hot. Due to her symptoms, she called her friend who is a retired Software engineer and was told that she needed to go get checked out.  She presented to the emergency department at [facility]. According to the ED physician notes stated her, she had no evidence of decreased sensation with minimal weakness in the left arm and leg that was estimated at 4.5/5. She was also noted to have a very slight left pronator drift. Code stroke was activated on the basis of her symptoms and presentation and she was transferred to [facility] emergency department for neurologic evaluation.  At the time of my assessment, she is no longer endorsing any of these symptoms apart from nausea. She is concerned that she may have had a TIA or stroke. 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 She was referred to neurology and vascular surgery at [facility]. She was seen by Dr. Creig Hines in neurology on 06/01/16 who equivocated about her symptoms and their relationship to the mild for muscular dysplasia noted on imaging. His impression was that this could represent migraine variant. He recommended getting an MRI scan of the brain, MRI scan of the cervical spine, and some labs for further evaluation with results as detailed below. She was also referred to vascular surgery and was seen by Dagoberto Ligas, PA, on 06/12/16. They recommended following up on labs ordered by neurology and did not feel there is any indication for revascularization. They also recommended carotid duplex in one year.  Studies from [facility]:  MRI brain without contrast on 06/08/16 showed no evidence of significant white matter disease or acute ischemia with dolichoectasia of the posterior circulation and normal signal in the major intracranial arteries.  MRI cervical spine without contrast on 06/08/16 showed multilevel degenerative disease with mild to moderate bilateral neural foraminal stenosis at C5-6, possible dynamic impingement upon the exiting left C7 nerve root.  Labs: From 06/23/14: Rheumatoid factor normal Antinuclear antibodies negative ESR 7 CRP 2.1   Last known normal: 1200 am  NIHSS score: 0 tPA given?: No; NIHSS 0, symptoms not fully consistent with cerebral ischemia  PMH:  Past Medical History:  Diagnosis Date  .  ADHD (attention deficit hyperactivity disorder)   . Anxiety attack   . Asthma    rarely uses inhaler  . Depression   . History of kidney stones   . Panic attacks   . Sleep disorder    tx with nuvigal    PSH:  Past Surgical History:  Procedure Laterality Date  . COLONOSCOPY WITH PROPOFOL N/A 01/11/2015   Procedure: COLONOSCOPY WITH PROPOFOL;  Surgeon: Garlan Fair, MD;  Location: [facility]  ENDOSCOPY;  Service: Endoscopy;  Laterality: N/A;  . HYSTEROSCOPY W/D&C N/A 03/05/2016   Procedure: DILATATION AND CURETTAGE /HYSTEROSCOPY;  Surgeon: Vanessa Kick, MD;  Location: [place] [facility];  Service: Gynecology;  Laterality: N/A;  . kdiney stone surgery  12/2015  . LIPOSUCTION    . WISDOM TOOTH EXTRACTION      Family history: History reviewed. No pertinent family history.  Social history:  Social History   Social History  . Marital status: Single    Spouse name: N/A  . Number of children: N/A  . Years of education: N/A   Occupational History  . Not on file.   Social History Main Topics  . Smoking status: Never Smoker  . Smokeless tobacco: Never Used  . Alcohol use No  . Drug use: No  . Sexual activity: Yes    Birth control/ protection: Post-menopausal   Other Topics Concern  . Not on file   Social History Narrative  . No narrative on file    Current outpatient meds: Current Meds  Medication Sig  . HYDROmorphone (DILAUDID) 2 MG tablet Take 2 mg by mouth every 6 (six) hours as needed for severe pain.    Current inpatient meds:  No current facility-administered medications for this encounter.    Current Outpatient Prescriptions  Medication Sig Dispense Refill  . HYDROmorphone (DILAUDID) 2 MG tablet Take 2 mg by mouth every 6 (six) hours as needed for severe pain.    Marland Kitchen albuterol (PROVENTIL HFA;VENTOLIN HFA) 108 (90 BASE) MCG/ACT inhaler Inhale 1 puff into the lungs every 30 (thirty) minutes as needed for wheezing or shortness of breath.    . amphetamine-dextroamphetamine (ADDERALL XR) 10 MG 24 hr capsule Take 10 mg by mouth daily.    . Armodafinil (NUVIGIL) 250 MG tablet Take 250 mg by mouth daily.    Marland Kitchen aspirin EC 81 MG tablet Take 1 tablet (81 mg total) by mouth daily. 30 tablet 1  . cephALEXin (KEFLEX) 500 MG capsule Take 1 capsule (500 mg total) by mouth 3 (three) times daily. 30 capsule 2  . enoxaparin (LOVENOX) 40 MG/0.4ML injection Inject 0.4 mLs (40 mg total) into  the skin daily. 30 Syringe 0  . ibuprofen (ADVIL,MOTRIN) 600 MG tablet Take 1 tablet (600 mg total) by mouth every 6 (six) hours as needed. 90 tablet 0  . LORazepam (ATIVAN) 1 MG tablet Take 1 tablet (1 mg total) by mouth 3 (three) times daily as needed for anxiety. 15 tablet 0  . methylPREDNISolone (MEDROL DOSEPAK) 4 MG TBPK tablet Take as directed 21 tablet 0  . oxyCODONE-acetaminophen (ROXICET) 5-325 MG tablet Take 1-2 tablets by mouth every 4 (four) hours as needed. 40 tablet 0    Allergies: No Known Allergies  ROS: As per HPI. A full 14-point review of systems was performed and is otherwise unremarkable.   PE:  BP 126/67 (BP Location: Right Arm)   Pulse 79   Temp 98.9 F (37.2 C) (Oral)   Resp 13   Ht 5' 4"  (1.626 m)  Wt 75.8 kg (167 lb)   SpO2 99%   BMI 28.67 kg/m   General: WDWN, no acute distress but seems mildly anxious and a bit tangential at times. AAO x4. Speech clear, no dysarthria. No aphasia. Follows commands briskly. Affect is bright with congruent mood. Comportment is normal.  HEENT: Normocephalic. Neck supple without LAD. MMM, OP clear. Dentition good. Sclerae anicteric. No conjunctival injection.  CV: Regular, no murmur. Carotid pulses full and symmetric, no bruits. Distal pulses 2+ and symmetric.  Lungs: CTAB.  Abdomen: Soft, non-distended, non-tender. Bowel sounds present x4.  Extremities: No C/C/E. LLE in Cam boot and dressing.  Neuro:  CN: Pupils are equal and round. They are symmetrically reactive from 3-->2 mm. EOMI without nystagmus. No reported diplopia. Facial sensation is intact to light touch. Face is symmetric at rest with normal strength and mobility. Hearing is intact to conversational voice. Palate elevates symmetrically and uvula is midline. Voice is normal in tone, pitch and quality. Bilateral SCM and trapezii are 5/5. Tongue is midline with normal bulk and mobility.  Motor: Normal bulk, tone, and strength. I did not test strength in the left lower  leg due to her reent surgery and associated discomfort. No tremor or other abnormal movements. No drift.  Sensation: Intact to light touch, pinprick, vibration, and joint position.  DTRs: 3+, symmetric. No pathologic reflexes.  Coordination: Finger-to-nose and heel-to-shin are without dysmetria. Finger taps are normal in amplitude and speed, no decrement.    Labs:  Lab Results  Component Value Date   WBC 7.5 07/05/2016   HGB 13.7 07/05/2016   HCT 40.3 07/05/2016   PLT 443 (H) 07/05/2016   GLUCOSE 103 (H) 07/05/2016   ALT 30 07/05/2016   AST 21 07/05/2016   NA 138 07/05/2016   K 3.7 07/05/2016   CL 105 07/05/2016   CREATININE 0.64 07/05/2016   BUN 19 07/05/2016   CO2 27 07/05/2016   TSH 4.451 08/29/2015   INR 0.87 07/05/2016    Imaging:  I have personally and independently reviewed CT scan of the head without contrast from 07/05/16. This is unremarkable.  I have personally and independently reviewed CT angiogram of the head and neck from 04/26/16. This demonstrates some mild irregularity of the distal portions of the cervical internal carotid arteries bilaterally. There is no compromise of these vessel's lumens on axial imaging. The remainder of her vasculature appears to be normal.  Assessment and Plan:  1. Possible TIA: My index of suspicion for cerebrovascular disease is very low given the presentation as described. However, since she was documented to have left-sided weakness at the outside ED, we will complete evaluation for possible TIA. She had recent vascular imaging with CT angiogram of the head and neck 2 months ago so this does not need to be repeated at this time. She has not had an echocardiogram so this will be ordered. I will also check fasting lipids and hemoglobin A1c. She reports that she was taking aspirin 81 mg daily after being diagnosed with fibromuscular dysplasia. However, this has been stopped while she has been receiving heparin injections following her foot  surgery. This should be restarted. I do not think that her reported fibromuscular dysplasia has anything to do with tonight's presentation.  2. Left hemiparesis: This was transient and has resolved. She has no evidence of weakness on her examination at this time. No additional recommendations.  3. Left-sided numbness: This has also resolved. At the end of the encounter, I was told that  she has had similar episodes of left-sided numbness in the past. These have been occurring off and on for quite some time with symptoms very similar to what she has experienced tonight apart from the reported nausea and warm feeling on her face. This may be related to her degenerative cervical spine disease. Again, my index of suspicion for cerebrovascular disease is quite low but we'll proceed with workup as above.  4. Fibromuscular dysplasia: This is limited to very mild irregularities of the distal cervical internal carotid arteries bilaterally. No intraluminal irregularity was appreciated. Restart aspirin 81 mg daily. She is being [facility] for this. No additional recommendations at this time.  This was discussed with the patient and she is in agreement with the plan as stated. She was given the opportunity to ask any questions and these were addressed to her satisfaction.  Thank you for the opportunity to participate in this patient's care. Please don't hesitate to call for questions or concerns. The stroke team will assume care of the patient moving forward.

## 2016-07-05 NOTE — ED Provider Notes (Signed)
[place] DEPT [facility] Provider Note   CSN: [id number] Arrival date & time: 07/05/16  0235     History   Chief Complaint Chief Complaint  Patient presents with  . Arm Pain    pt fears blood clot  . Leg Pain    HPI Tanya Peterson is a 55 y.o. female.  The history is provided by the patient.  Arm Pain   Leg Pain    She woke up at 1:30 AM with numbness in the left leg and left arm. She touched her face and noted the left side of the face was warm. She also had an aching pain in left shoulder. She is approximately 2 weeks post surgery on her left foot and her left leg is in a [employer]. She is worried about a possible blood clot in her leg. She denies any headache. There was nausea but to others been no chest pain. She denies any dyspnea. Of note, she recently was evaluated by a neurologist and a vascular surgeon because of CT scan which showed fibromuscular dysplasia in her carotid artery.  Past Medical History:  Diagnosis Date  . ADHD (attention deficit hyperactivity disorder)   . Anxiety attack   . Asthma    rarely uses inhaler  . Depression   . History of kidney stones   . Panic attacks   . Sleep disorder    tx with nuvigal    There are no active problems to display for this patient.   Past Surgical History:  Procedure Laterality Date  . COLONOSCOPY WITH PROPOFOL N/A 01/11/2015   Procedure: COLONOSCOPY WITH PROPOFOL;  Surgeon: Garlan Fair, MD;  Location: [facility];  Service: Endoscopy;  Laterality: N/A;  . HYSTEROSCOPY W/D&C N/A 03/05/2016   Procedure: DILATATION AND CURETTAGE /HYSTEROSCOPY;  Surgeon: Vanessa Kick, MD;  Location: [place] [facility];  Service: Gynecology;  Laterality: N/A;  . kdiney stone surgery  12/2015  . LIPOSUCTION    . WISDOM TOOTH EXTRACTION      OB History    No data available       Home Medications    Prior to Admission medications   Medication Sig Start Date End Date Taking? Authorizing Provider  HYDROmorphone (DILAUDID) 2 MG tablet  Take 2 mg by mouth every 6 (six) hours as needed for severe pain.   Yes Historical Provider, MD  albuterol (PROVENTIL HFA;VENTOLIN HFA) 108 (90 BASE) MCG/ACT inhaler Inhale 1 puff into the lungs every 30 (thirty) minutes as needed for wheezing or shortness of breath.    Historical Provider, MD  amphetamine-dextroamphetamine (ADDERALL XR) 10 MG 24 hr capsule Take 10 mg by mouth daily.    Historical Provider, MD  Armodafinil (NUVIGIL) 250 MG tablet Take 250 mg by mouth daily.    Historical Provider, MD  aspirin EC 81 MG tablet Take 1 tablet (81 mg total) by mouth daily. 04/27/16   Margarita Mail, PA-C  cephALEXin (KEFLEX) 500 MG capsule Take 1 capsule (500 mg total) by mouth 3 (three) times daily. 07/04/16   Trula Slade, DPM  enoxaparin (LOVENOX) 40 MG/0.4ML injection Inject 0.4 mLs (40 mg total) into the skin daily. 06/04/16   Trula Slade, DPM  ibuprofen (ADVIL,MOTRIN) 600 MG tablet Take 1 tablet (600 mg total) by mouth every 6 (six) hours as needed. 03/05/16   Vanessa Kick, MD  LORazepam (ATIVAN) 1 MG tablet Take 1 tablet (1 mg total) by mouth 3 (three) times daily as needed for anxiety. 08/29/15   [place],  DO  methylPREDNISolone (MEDROL DOSEPAK) 4 MG TBPK tablet Take as directed 03/09/16   Trula Slade, DPM  oxyCODONE-acetaminophen (ROXICET) 5-325 MG tablet Take 1-2 tablets by mouth every 4 (four) hours as needed. 07/04/16   Trula Slade, DPM    Family History History reviewed. No pertinent family history.  Social History Social History  Substance Use Topics  . Smoking status: Never Smoker  . Smokeless tobacco: Never Used  . Alcohol use No     Allergies   Review of patient's allergies indicates no known allergies.   Review of Systems Review of Systems  All other systems reviewed and are negative.    Physical Exam Updated Vital Signs BP (!) 118/54 (BP Location: Right Arm)   Pulse 104   Temp 98.3 F (36.8 C) (Oral)   Resp 20   Ht 5\' 4"  (1.626 m)   Wt 167  lb (75.8 kg)   SpO2 100%   BMI 28.67 kg/m   Physical Exam  Nursing note and vitals reviewed.  55 year old female, resting comfortably and in no acute distress. Vital signs are significant for mild tachycardia. Oxygen saturation is 100%, which is normal. Head is normocephalic and atraumatic. PERRLA, EOMI. Oropharynx is clear. Neck is nontender and supple without adenopathy or JVD. There are no carotid bruits. Back is nontender and there is no CVA tenderness. Lungs are clear without rales, wheezes, or rhonchi. Chest is nontender. Heart has regular rate and rhythm without murmur. Abdomen is soft, flat, nontender without masses or hepatosplenomegaly and peristalsis is normoactive. Extremities have no cyanosis or edema. Left lower leg is in a Cam Walker which has not been removed. Skin is warm and dry without rash. Neurologic: Mental status is normal, cranial nerves are intact. Careful sensory exam shows no areas of decreased sensation. There is very mild weakness of the left arm and leg relative to the right, although, she is right-handed. Strength is 4.5/5. There is very slight left arm pronator drift. Speech is normal.  ED Treatments / Results  Labs (all labs ordered are listed, but only abnormal results are displayed) Labs Reviewed  CBC - Abnormal; Notable for the following:       Result Value   Platelets 443 (*)    All other components within normal limits  COMPREHENSIVE METABOLIC PANEL - Abnormal; Notable for the following:    Glucose, Bld 103 (*)    All other components within normal limits  URINE RAPID DRUG SCREEN, HOSP PERFORMED - Abnormal; Notable for the following:    Opiates POSITIVE (*)    All other components within normal limits  URINALYSIS, ROUTINE W REFLEX MICROSCOPIC (NOT AT [facility]) - Abnormal; Notable for the following:    Leukocytes, UA TRACE (*)    All other components within normal limits  URINE MICROSCOPIC-ADD ON - Abnormal; Notable for the following:    Squamous  Epithelial / LPF 0-5 (*)    Bacteria, UA FEW (*)    All other components within normal limits  ETHANOL  PROTIME-INR  APTT  DIFFERENTIAL  TROPONIN I    EKG  EKG Interpretation  Date/Time:  Thursday July 05 2016 03:05:43 EDT Ventricular Rate:  90 PR Interval:  152 QRS Duration: 80 QT Interval:  352 QTC Calculation: 430 R Axis:   61 Text Interpretation:  Normal sinus rhythm Normal ECG When compared with ECG of 05/03/2016, No significant change was found Confirmed by [facility]  MD, Lyfe Reihl ([id number]) on 07/05/2016 3:08:28 AM  Radiology Ct Head Code Stroke W/o Cm  Result Date: 07/05/2016 CLINICAL DATA:  Code stroke. Recent foot surgery. Left leg and arm tingling for 2 hours. Nausea. EXAM: CT HEAD WITHOUT CONTRAST TECHNIQUE: Contiguous axial images were obtained from the base of the skull through the vertex without intravenous contrast. COMPARISON:  MRI brain 06/08/2016.  CT head 04/26/2016 FINDINGS: Brain: Ventricles and sulci appear symmetrical. No ventricular dilatation. No mass effect or midline shift. No abnormal extra-axial fluid collections. Gray-white matter junctions are distinct. Basal cisterns are not effaced. No evidence of acute intracranial hemorrhage. Vascular: No hyperdense vessel or unexpected calcification. Skull: Calvarium appears intact. Sinuses/Orbits: No acute finding. Other: No significant change since previous study. IMPRESSION: No acute intracranial abnormalities. These results were called by telephone at the time of interpretation on 07/05/2016 at 3:50 am to Dr. Delora Fuel , who verbally acknowledged these results. Electronically Signed   By: Lucienne Capers M.D.   On: 07/05/2016 03:52    Procedures Procedures (including critical care time) CRITICAL CARE Performed by: [id number] Total critical care time: 35 minutes Critical care time was exclusive of separately billable procedures and treating other patients. Critical care was necessary to treat or prevent  imminent or life-threatening deterioration. Critical care was time spent personally by me on the following activities: development of treatment plan with patient and/or surrogate as well as nursing, discussions with consultants, evaluation of patient's response to treatment, examination of patient, obtaining history from patient or surrogate, ordering and performing treatments and interventions, ordering and review of laboratory studies, ordering and review of radiographic studies, pulse oximetry and re-evaluation of patient's condition.   Medications Ordered in ED Medications - No data to display   Initial Impression / Assessment and Plan / ED Course  I have reviewed the triage vital signs and the nursing notes.  Pertinent labs & imaging results that were available during my care of the patient were reviewed by me and considered in my medical decision making (see chart for details).  Clinical Course    Left sided numbness which appears to be a mild stroke. 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 This is probably not related to her current episode. Code stroke is activated. I discussed case with Dr. Shon Hale of neurology service. CT of head will be obtained here and she will be sent via ambulance to [facility] emergency department where Dr. Shon Hale will evaluate her.  Final Clinical Impressions(s) / ED Diagnoses   Final diagnoses:  Cerebrovascular accident (CVA) due to occlusion of cerebral artery [facility])    New Prescriptions New Prescriptions   No medications on file     Delora Fuel, MD [phone] [phone]

## 2016-07-05 NOTE — ED Triage Notes (Signed)
Pt reports onset of left arm and left leg tingling at 0130 tonight no other deficits noted

## 2016-07-05 NOTE — ED Notes (Signed)
Admitting MD at bedside.

## 2016-07-05 NOTE — ED Notes (Signed)
[facility] here for transport. 

## 2016-07-05 NOTE — ED Notes (Signed)
Admitting, MD at bedside.  

## 2016-07-05 NOTE — Consult Note (Signed)
Medical Consultation   Tanya Peterson  [id number]  DOB: 09/29/1961  DOA: 07/05/2016  PCP: Bartholome Bill, MD   Consultants:  Earleen Newport - podiatry; Love - psych; Dalbert Batman - neurology; Maryjean Morn - vascular surgery Patient coming from: home - lives alone; NOK: Loyal Jacobson, [phone]  Chief Complaint:  Neurologic symptoms  HPI: Tanya Peterson is a 55 y.o. female with medical history significant of psychiatric illness and recent left foot tendon repair.  She reports that she took Dilaudid at midnight and went to bed (has been taking for a week without difficulty.  Started Keflex again yesterday and had also taken it for 10 days prior without difficulty.  Also taking Lovenox nightly).  Woke up at 0130 with left foot and arm tingling.  Tried to rub arm to wake it up.  Felt nauseated, no vomiting.  Foot is more swollen and really warm but tingling has improved.  Sleeps on couch, went back to couch and laid down.  Temp "is always 97.6" but it was 98.8.  Left side of face felt warm but right side felt normal.  Very thirsty.  Shoulder developed sharp pain and also with pain in her neck.  :Just didn't feel right."    Went to [facility], ordered head CT, called code stroke and transferred here.   ED Course: At [facility], per Dr. Roxanne Mins: Left sided numbness which appears to be a mild stroke. 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 This is probably not related to her current episode. Code stroke is activated. I discussed case with Dr. Shon Hale of neurology service. CT of head will be obtained here and she will be sent via ambulance to [facility] emergency department where Dr. Shon Hale will evaluate her.  At [facility], per Dr. Dina Rich: Patient transferred from [facility] as a code stroke. Reports onset of left-sided  numbness and paresthesias starting at 1:30 AM. Recent left tendon repair in the foot. She states that her symptoms wax and wane. She was seen and evaluated by neurology upon arrival. Doubt acute stroke and not a TPA candidate; however, will admit for TIA workup given persistent numbness and involvement of the left face, arm, and leg.   Ambulatory Status:  Non weight bearing due to recent foot surgery    Review of Systems:  ROS As per HPI otherwise 10 point review of systems reviewed negative.     Past Medical History: Past Medical History:  Diagnosis Date  . ADHD (attention deficit hyperactivity disorder)   . Asthma    rarely uses inhaler  . Depression   . History of kidney stones   . Panic attacks   . Sleep disorder    tx with nuvigal    Past Surgical History: Past Surgical History:  Procedure Laterality Date  . COLONOSCOPY WITH PROPOFOL N/A 01/11/2015   Procedure: COLONOSCOPY WITH PROPOFOL;  Surgeon: Garlan Fair, MD;  Location: [facility];  Service: Endoscopy;  Laterality: N/A;  . HYSTEROSCOPY W/D&C N/A 03/05/2016   Procedure: DILATATION AND CURETTAGE /HYSTEROSCOPY;  Surgeon: Vanessa Kick, MD;  Location: [facility];  Service: Gynecology;  Laterality: N/A;  . kdiney stone surgery  12/2015  . LIPOSUCTION    . TENDON REPAIR Left 06/20/2016   foot  . WISDOM TOOTH EXTRACTION  Allergies:  No Known Allergies   Social History:  reports that she has never smoked. She has never used smokeless tobacco. She reports that she does not drink alcohol or use drugs.   Family History: Family History  Problem Relation Age of Onset  . Alzheimer's disease Mother   . Alzheimer's disease Father       Physical Exam: Vitals:   07/05/16 0427 07/05/16 0431 07/05/16 0500 07/05/16 0600  BP:  126/67 129/68 113/55  Pulse: 88 79 85 95  Resp:  13 18 18   Temp:  98.9 F (37.2 C)    TempSrc:  Oral    SpO2: 99% 99% 99% 99%  Weight:      Height:        Constitutional: Alert and  awake, oriented x3, not in any acute distress. Eyes: PERLA, EOMI, irises appear normal, anicteric sclera,  ENMT: external ears and nose appear normal,             Lips appears normal, oropharynx mucosa, tongue, posterior pharynx appear normal  Neck: neck appears normal, no masses, normal ROM, no thyromegaly, no JVD  CVS: S1-S2 clear, no murmur rubs or gallops, no LE edema, normal pedal pulses  Respiratory:  clear to auscultation bilaterally, no wheezing, rales or rhonchi. Respiratory effort normal. No accessory muscle use.  Abdomen: soft nontender, nondistended, normal bowel sounds, no hepatosplenomegaly, no hernias  Musculoskeletal: : no cyanosis, clubbing or edema noted bilaterally      L foot examined after CAM walker removed.  There is a 4 cm well healing incision with staples present along the lateral aspect of the ankle.  There is mild surrounding edema that appears to be an inflammatory reaction to the staples with appropriate healing and there is no streaking or other concern for cellulitis.  The patient and her friend also examined it and report that it looks similar to when she was seen by the podiatrist yesterday AM. Neuro: Cranial nerves II-XII intact, strength, sensation, reflexes Psych: judgement and insight appear normal, stable mood and affect, mental status Skin: no rashes or lesions or ulcers, no induration or nodules    Data reviewed:  I have personally reviewed following labs and imaging studies Labs:  CBC:  Recent Labs Lab 07/05/16 0346  WBC 7.5  NEUTROABS 3.6  HGB 13.7  HCT 40.3  MCV 92.2  PLT 443*    Basic Metabolic Panel:  Recent Labs Lab 07/05/16 0346  NA 138  K 3.7  CL 105  CO2 27  GLUCOSE 103*  BUN 19  CREATININE 0.64  CALCIUM 9.5   GFR Estimated Creatinine Clearance: 80.1 mL/min (by C-G formula based on SCr of 0.8 mg/dL). Liver Function Tests:  Recent Labs Lab 07/05/16 0346  AST 21  ALT 30  ALKPHOS 78  BILITOT 0.5  PROT 7.2  ALBUMIN  4.4   No results for input(s): LIPASE, AMYLASE in the last 168 hours. No results for input(s): AMMONIA in the last 168 hours. Coagulation profile  Recent Labs Lab 07/05/16 0346  INR 0.87    Cardiac Enzymes:  Recent Labs Lab 07/05/16 0346  TROPONINI <0.03   BNP: Invalid input(s): POCBNP CBG: No results for input(s): GLUCAP in the last 168 hours. D-Dimer No results for input(s): DDIMER in the last 72 hours. Hgb A1c No results for input(s): HGBA1C in the last 72 hours. Lipid Profile No results for input(s): CHOL, HDL, LDLCALC, TRIG, CHOLHDL, LDLDIRECT in the last 72 hours. Thyroid function studies No results for input(s): TSH,  T4TOTAL, T3FREE, THYROIDAB in the last 72 hours.  Invalid input(s): FREET3 Anemia work up No results for input(s): VITAMINB12, FOLATE, FERRITIN, TIBC, IRON, RETICCTPCT in the last 72 hours. Urinalysis    Component Value Date/Time   COLORURINE YELLOW 07/05/2016 0340   APPEARANCEUR CLEAR 07/05/2016 0340   LABSPEC 1.009 07/05/2016 0340   PHURINE 6.0 07/05/2016 0340   GLUCOSEU NEGATIVE 07/05/2016 0340   HGBUR NEGATIVE 07/05/2016 0340   BILIRUBINUR NEGATIVE 07/05/2016 0340   KETONESUR NEGATIVE 07/05/2016 0340   PROTEINUR NEGATIVE 07/05/2016 0340   UROBILINOGEN 0.2 12/01/2013 0600   NITRITE NEGATIVE 07/05/2016 0340   LEUKOCYTESUR TRACE (A) 07/05/2016 0340     Microbiology No results found for this or any previous visit (from the past 240 hour(s)).     Inpatient Medications:   Scheduled Meds: .  stroke: mapping our early stages of recovery book   Does not apply Once  . aspirin EC  81 mg Oral Daily   Continuous Infusions:    Radiological Exams on Admission: Ct Head Code Stroke W/o Cm  Result Date: 07/05/2016 CLINICAL DATA:  Code stroke. Recent foot surgery. Left leg and arm tingling for 2 hours. Nausea. EXAM: CT HEAD WITHOUT CONTRAST TECHNIQUE: Contiguous axial images were obtained from the base of the skull through the vertex without  intravenous contrast. COMPARISON:  MRI brain 06/08/2016.  CT head 04/26/2016 FINDINGS: Brain: Ventricles and sulci appear symmetrical. No ventricular dilatation. No mass effect or midline shift. No abnormal extra-axial fluid collections. Gray-white matter junctions are distinct. Basal cisterns are not effaced. No evidence of acute intracranial hemorrhage. Vascular: No hyperdense vessel or unexpected calcification. Skull: Calvarium appears intact. Sinuses/Orbits: No acute finding. Other: No significant change since previous study. IMPRESSION: No acute intracranial abnormalities. These results were called by telephone at the time of interpretation on 07/05/2016 at 3:50 am to Dr. Delora Fuel , who verbally acknowledged these results. Electronically Signed   By: Lucienne Capers M.D.   On: 07/05/2016 03:52    Impression/Recommendations Principal Problem:   Neurologic complaint, functional Active Problems:   ADHD (attention deficit hyperactivity disorder)   S/P tendon repair  Neurologic symptoms -Patient evaluated by Dr. Shon Hale and myself -Symptoms do not appear to be c/w stroke or TIA -Had negative head CT tonight and recent negative CTA of head and neck; should not need MRI -Will complete CVA evaluation with echo, FLP, and A1c -After completion of stroke work-up, patient may be discharged to home from the ER with outpatient follow-up -She should take ASA 81 mg daily  ADHD -Patient with several different psychiatric diagnoses -If concern about stability of these diagnoses, outpatient [facility] consult may be warranted -Currently no concerns for her safety  S/p tendon repair -No current evidence of cellulitis or other cause for vague neurologic symptoms from tonight -F/u with podiatrist as scheduled   Disposition: Okay for discharge from the ER when studies are completed.  Please schedule outpatient PCP f/u within the week.  Thank you for this consultation.     Time Spent: 44 minutes  Karmen Bongo M.D. [facility] 07/05/2016, 6:21 AM

## 2016-07-05 NOTE — ED Notes (Signed)
RN assessed grip strengths which she found that the LEFT was slightly weaker than the right

## 2016-07-05 NOTE — ED Provider Notes (Signed)
Patient awaiting echocardiogram to complete stroke workup per neurology. She had an episode of left-sided numbness and tingling which has resolved. Recent negative CTA  Has been seen by both neurology and hospitalist who do not feel she needs admission. MRI was completed by neurology request and was negative. Echocardiogram shows no acute valvular abnormalities. EF is normal.  MRI negative. D/w Dr. Erlinda Hong who feels patient appropriate for outpatient neurology followup. Will start ASA. LUE negative for Doppler.  Outpatient follow up with neurology. Return precautions discussed.   Ezequiel Essex, MD 07/05/16 1538

## 2016-07-05 NOTE — ED Notes (Signed)
Echo at bedside

## 2016-07-11 ENCOUNTER — Ambulatory Visit (INDEPENDENT_AMBULATORY_CARE_PROVIDER_SITE_OTHER): Payer: Managed Care, Other (non HMO) | Admitting: Podiatry

## 2016-07-11 DIAGNOSIS — M25372 Other instability, left ankle: Secondary | ICD-10-CM

## 2016-07-11 DIAGNOSIS — Z9889 Other specified postprocedural states: Secondary | ICD-10-CM

## 2016-07-11 MED ORDER — MUPIROCIN 2 % EX OINT: 1.0000 "application " | TOPICAL_OINTMENT | Freq: Two times a day (BID) | CUTANEOUS | 2 refills | Status: DC

## 2016-07-11 MED ORDER — CEPHALEXIN 500 MG PO CAPS: 500.0000 mg | ORAL_CAPSULE | Freq: Three times a day (TID) | ORAL | 2 refills | Status: DC

## 2016-07-12 NOTE — Progress Notes (Signed)
Subjective: Tanya Peterson is a 55 y.o. is seen today in office s/p left peroneal tendon repair and ATFL repair preformed on 06/20/16. She presents today for follow-up to remove the remainder the sutures. She states that she has been trying to move her ankle as much as possible to get some strength back and heat the blood flowing. After last limited she did go to the hospital she was having some numbness to her face and some tingling to her leg. Stroke was apparently ruled out but she made had a small TIA but not definitive. She is continue the antibiotic. Denies any systemic complaints such as fevers, chills, nausea, vomiting. No calf pain, chest pain, shortness of breath.   Objective: General: No acute distress, AAOx3  DP/PT pulses palpable 2/4, CRT < 3 sec to all digits.  Protective sensation intact. Motor function intact.  Cast is clean, dry, intact. Left foot: Incision is well coapted without any evidence of dehiscence and half of the staples are intact. Erythema is improved. Resolving around the area as well as a small bumps. There is decreased edema to the area. There is no ascending synovitis, fluctuance, crepitus. There is no drainage or pus coming from the incision. Decrease pain along the incision site.  No other open lesions or pre-ulcerative lesions.  No pain with calf compression, swelling, warmth, erythema.   Assessment and Plan:  Status post left ankle surgery, improving  -Treatment options discussed including all alternatives, risks, and complications -Remainder the staples removed today without complications. Recommended a Pearson ointment along the incision daily. Hold off on Neosporin or triple antibiotic ointment as I believe that some of the erythema and the bumps that she was having is more from an allergic reaction as opposed to infection. Continue to monitor for signs or symptoms of infection. Hold off on any incision wet for now. Continue cam boot at all times and recommended to  hold off on exercises. Continue nonweightbearing for now. Finish course of Lovenox. Pain medication as needed. Ice and elevation. Follow-up in 2 weeks or sooner if needed. Call any questions or concerns the meantime.  At this, I do recommend her to continue to hold off on returning to work.  Celesta Gentile, DPM

## 2016-07-24 ENCOUNTER — Ambulatory Visit (INDEPENDENT_AMBULATORY_CARE_PROVIDER_SITE_OTHER): Payer: Managed Care, Other (non HMO) | Admitting: Podiatry

## 2016-07-24 ENCOUNTER — Encounter: Payer: Self-pay | Admitting: Podiatry

## 2016-07-24 DIAGNOSIS — Z9889 Other specified postprocedural states: Secondary | ICD-10-CM

## 2016-07-24 DIAGNOSIS — M25372 Other instability, left ankle: Secondary | ICD-10-CM

## 2016-07-24 NOTE — Progress Notes (Signed)
Subjective: Ednamae Lye is a 55 y.o. is seen today in office s/p left peroneal tendon repair and ATFL repair preformed on 06/20/16. She states that she is doing better. The swelling is improving but it does continue. She states that the scar is looking good and she has had no redness or any drainage. Denies any systemic complaints such as fevers, chills, nausea, vomiting. No calf pain, chest pain, shortness of breath.   Objective: General: No acute distress, AAOx3  DP/PT pulses palpable 2/4, CRT < 3 sec to all digits.  Protective sensation intact. Motor function intact.  Presents in CAM boot.  Left foot: Incision is well coapted without any evidence of dehiscence and a scar has formed. There is decreased edema to the area. No surrounding erythema or ascending cellulitis and there is no fluctuance or crepitus. The does continue be some mild discomfort on the course of the peroneal tendons on the ATFL but is improving. There is no other areas of tenderness bilateral. No other open lesions or pre-ulcerative lesions.  No pain with calf compression, swelling, warmth, erythema.   Assessment and Plan:  Status post left ankle surgery, improving  -Treatment options discussed including all alternatives, risks, and complications -At this time continue nonweightbearing in a cam boot most were cam boot at all times. Continue ice and elevation. Gentle range of motion exercises the toes but hold off on any ankle range of motion exercises. -Pain medication as needed  -Lovenox and once complete can switch to ASA daily.  -Although she is ely improving her to recommend her to continue to hold off on returning back to work as she is still nonweightbearing any needs to limit her activity. -Follow-up in 2 weeks or sooner if any problems arise. In the meantime, encouraged to call the office with any questions, concerns, change in symptoms.  *possibly start to transition to [facility] in CAM boot next apppointment  Celesta Gentile, DPM

## 2016-08-07 ENCOUNTER — Ambulatory Visit (INDEPENDENT_AMBULATORY_CARE_PROVIDER_SITE_OTHER): Payer: Managed Care, Other (non HMO) | Admitting: Podiatry

## 2016-08-07 ENCOUNTER — Encounter: Payer: Self-pay | Admitting: Podiatry

## 2016-08-07 DIAGNOSIS — M7732 Calcaneal spur, left foot: Secondary | ICD-10-CM

## 2016-08-07 DIAGNOSIS — M722 Plantar fascial fibromatosis: Secondary | ICD-10-CM

## 2016-08-07 DIAGNOSIS — Z9889 Other specified postprocedural states: Secondary | ICD-10-CM

## 2016-08-07 DIAGNOSIS — M25372 Other instability, left ankle: Secondary | ICD-10-CM

## 2016-08-07 DIAGNOSIS — M7662 Achilles tendinitis, left leg: Secondary | ICD-10-CM

## 2016-08-07 MED ORDER — TRIAMCINOLONE ACETONIDE 0.025 % EX OINT: 1.0000 "application " | TOPICAL_OINTMENT | Freq: Two times a day (BID) | CUTANEOUS | 0 refills | Status: DC

## 2016-08-07 NOTE — Progress Notes (Addendum)
Subjective: Remingtyn Trollinger is a 55 y.o. is seen today in office s/p left peroneal tendon repair and ATFL repair preformed on 06/20/16. She states that she is doing better and she has been improving. She is continue nonweightbearing. She has done some range of motion exercises at home and she has not had any pain but she does feel stiff she were ankle. Denies any systemic complaints such as fevers, chills, nausea, vomiting. No calf pain, chest pain, shortness of breath.   She is asking about Kenalog ointment for the scarring.  Objective: General: No acute distress, AAOx3  DP/PT pulses palpable 2/4, CRT < 3 sec to all digits.  Protective sensation intact. Motor function intact.  Presents in CAM boot.  Left foot: Incision is well coapted without any evidence of dehiscence and a scar has formed. There is decreased edema to the area. No surrounding erythema or ascending cellulitis and there is no fluctuance or crepitus. At this time there is no smoking tenderness palpation along the course the ATFL or along the course of peroneal tendons. There is stiffness of the ankle mostly knee version. There is no area pinpoint tenderness or pain the vibratory sensation.  At this time there is no tenderness along the course of the Achilles tendon. As noted on the course the plantar fascia however she does surgically get pain to this area prior to surgery but this has improved as well, although she has been nonweightbearing. No other areas of tenderness bilaterally.  No pain with calf compression, swelling, warmth, erythema.   Assessment and Plan:  Status post left ankle surgery, improving  -Treatment options discussed including all alternatives, risks, and complications -At this time she inserted transition to partial weightbearing as tolerated before starting full weightbearing in the cam boot. She must remain in a cam boot at this time. This needs big gradual transition. We'll go ahead and start physical therapy as  she is able to put weight on her foot in the cam boot. She has started physical therapy to increase her range of motion, rehabilitation exercises and transition into a regular shoe. Prescription for PT given today for [facility].  -Continue ice and elevation. -Ordered triamcinolone ointment for scar -She was scanned for orthotics were sent to [place] labs. -Patient to straight her return to work in the next 4-5 weeks. This time she is still nonweightbearing not able to the weight on her foot. As she is progressing and starting physical therapy and improving she can return to work but not at this point. -Follow-up in 3 weeks or sooner if needed. Call any questions or concerns in the meantime.  Celesta Gentile, DPM

## 2016-08-28 ENCOUNTER — Ambulatory Visit (INDEPENDENT_AMBULATORY_CARE_PROVIDER_SITE_OTHER): Payer: Managed Care, Other (non HMO) | Admitting: Podiatry

## 2016-08-28 ENCOUNTER — Encounter: Payer: Self-pay | Admitting: Podiatry

## 2016-08-28 DIAGNOSIS — T148XXA Other injury of unspecified body region, initial encounter: Secondary | ICD-10-CM

## 2016-08-28 DIAGNOSIS — M25372 Other instability, left ankle: Secondary | ICD-10-CM

## 2016-08-28 NOTE — Progress Notes (Signed)
Subjective: Tanya Peterson is a 55 y.o. is seen today in office s/p left peroneal tendon repair and ATFL repair preformed on 06/20/16. She states that she is approved and she is kind of physical therapy for the last couple sessions she feels that she has made some slow but steady improvements. She does still continue to walk in the cam boot. She has not tried to come out of the CAM boot. Her pain is controlled. Denies any systemic complaints such as fevers, chills, nausea, vomiting. No calf pain, chest pain, shortness of breath.    Objective: General: No acute distress, AAOx3  DP/PT pulses palpable 2/4, CRT < 3 sec to all digits.  Protective sensation intact. Motor function intact.  Presents in CAM boot.  Left foot: Incision is well coapted without any evidence of dehiscence and a scar has formed. There is mild tenderness to palpation of the surgical site however does appear to be improved. Her range of motion appears to be improving as well and is stiffness is also improved. There is no area pinpoint bony tenderness. Mild edema around the surgical site discontinue but it does appear to be improved compared to even what it was prior to surgery. Erythema or increase in warmth or any clinical signs of infection. No other areas of tenderness bilaterally. No other areas of tenderness bilaterally.  No pain with calf compression, swelling, warmth, erythema.   Assessment and Plan:  Status post left ankle surgery, improving  -Treatment options discussed including all alternatives, risks, and complications -Continue physical therapy. Compression sock to help with swelling. Ice and elevation. Ankle brace was dispensed and she can start to wean off of the cam boot and go into a regular shoe with an ankle brace. As she feels ready she can start to go into a regular shoe without the brace over the next several weeks. She is still walking with a cane and a CAM boot and therefore think she should hold off on that to  work until October 25. I will see her back on October 24 to evaluate her at that time she should be able to return to work with possible restrictions.  -Follow-up in 2 weeks or sooner if needed. Call any questions or concerns in the meantime.  Celesta Gentile, DPM

## 2016-08-31 ENCOUNTER — Telehealth: Payer: Self-pay | Admitting: *Deleted

## 2016-08-31 NOTE — Telephone Encounter (Signed)
Called patient and left a message about the inserts and I have the order in and I talked to melody about scanning. Lattie Haw

## 2016-09-11 ENCOUNTER — Ambulatory Visit (INDEPENDENT_AMBULATORY_CARE_PROVIDER_SITE_OTHER): Payer: Managed Care, Other (non HMO) | Admitting: Podiatry

## 2016-09-11 ENCOUNTER — Encounter: Payer: Self-pay | Admitting: Podiatry

## 2016-09-11 DIAGNOSIS — Z9889 Other specified postprocedural states: Secondary | ICD-10-CM

## 2016-09-11 DIAGNOSIS — M722 Plantar fascial fibromatosis: Secondary | ICD-10-CM

## 2016-09-11 DIAGNOSIS — M7732 Calcaneal spur, left foot: Secondary | ICD-10-CM

## 2016-09-11 DIAGNOSIS — T148XXA Other injury of unspecified body region, initial encounter: Secondary | ICD-10-CM

## 2016-09-11 DIAGNOSIS — M25372 Other instability, left ankle: Secondary | ICD-10-CM

## 2016-09-11 NOTE — Patient Instructions (Signed)

## 2016-09-11 NOTE — Progress Notes (Signed)
Subjective: Tanya Peterson is a 55 y.o. is seen today in office s/p left peroneal tendon repair and ATFL repair preformed on 06/20/16. She states that she is doing better and she has continued with physical therapy. She does wear a regular shoe in a brace intermittently. She has swells as she walks quite a bit but this is improving as well. She does use a cane when going long distances and going on uneven surfaces. However overall she feels that she is improving. Denies any systemic complaints such as fevers, chills, nausea, vomiting. No calf pain, chest pain, shortness of breath.    Objective: General: No acute distress, AAOx3  DP/PT pulses palpable 2/4, CRT < 3 sec to all digits.  Protective sensation intact. Motor function intact.  Presents in CAM boot.  Left foot: Incision is well coapted without any evidence of dehiscence and a scar has formed. There is decreased edema on the surgical site and there is decreased pain as well. There is no one erythema or increased warmth. Ankle, subtalar joint motion intact with no gross instability. There is no area pinpoint bony tenderness there is no pain vibratory sensation. No other areas of tenderness bilaterally.  No pain with calf compression, swelling, warmth, erythema.   Assessment and Plan:  Status post left ankle surgery, improving  -Treatment options discussed including all alternatives, risks, and complications -Continue physical therapy. Continue with supportive shoe gear and I dispensed compression anklet's today. She is scheduled to return to work tomorrow. She can return to work with restrictions. She is to ice her foot 15 minutes every 2 hours as well as limit carrying increasing heavy objects. She should return to work to wear the CAM boot for the first couple days until she is able to go without the boot. -Orthotics were dispensed today. Oral and written break in instructions were discussed. -Ice and elevation -Follow-up in 4 weeks or sooner  if needed. Call any questions or concerns in the meantime.  Celesta Gentile, DPM

## 2016-10-09 ENCOUNTER — Encounter: Payer: Self-pay | Admitting: Podiatry

## 2016-10-09 ENCOUNTER — Ambulatory Visit (INDEPENDENT_AMBULATORY_CARE_PROVIDER_SITE_OTHER): Payer: Managed Care, Other (non HMO) | Admitting: Podiatry

## 2016-10-09 DIAGNOSIS — M25372 Other instability, left ankle: Secondary | ICD-10-CM

## 2016-10-09 DIAGNOSIS — Z9889 Other specified postprocedural states: Secondary | ICD-10-CM

## 2016-10-09 DIAGNOSIS — T148XXA Other injury of unspecified body region, initial encounter: Secondary | ICD-10-CM

## 2016-10-09 MED ORDER — METHYLPREDNISOLONE 4 MG PO TBPK: ORAL_TABLET | ORAL | 0 refills | Status: DC

## 2016-10-09 NOTE — Progress Notes (Signed)
Subjective: Tanya Peterson is a 55 y.o. is seen today in office s/p left peroneal tendon repair and ATFL repair preformed on 06/20/16. She states she is doing "good". She did have to go on long term disability as she has to go back to work full time with no restrictions. She seems to be upset about this today as she believes it is more due to office politics. She has been doing physical therapy and it cut it back to twice a week for which she was at the about 3 times a week. She states that last week they did increase her physical therapy worked are harder and since then she has had discomfort on the ankle and she's had to take pain medicine for couple days after that therapy session. This is new since the therapy session. She denies any recent injury or trauma. Denies any systemic complaints such as fevers, chills, nausea, vomiting. No calf pain, chest pain, shortness of breath.    Objective: General: No acute distress, AAOx3  DP/PT pulses palpable 2/4, CRT < 3 sec to all digits.  Protective sensation intact. Motor function intact.  Presents in CAM boot.  Left foot: Incision is well coapted without any evidence of dehiscence and a scar has formed. There is  mild edema on the surgical site and there is  mildpain as well. There is no  surroundingerythema or increased warmth. Ankle, subtalar joint motion intact with no gross instability. There is no area pinpoint bony tenderness there is no pain vibratory sensation. No other areas of tenderness bilaterally.  No pain with calf compression, swelling, warmth, erythema.   Assessment and Plan:  Status post left ankle surgery,  mild increase in pain after physical therapy last week.  -Treatment options discussed including all alternatives, risks, and complications -Given increase in pain as well as she has noticed more swelling will start a Medrol Dosepak which is prescribed today. This was sent to her pharmacy. -Ankle brace as needed over the next couple  days -Ice/elvation -Limit activity.  -Pain medication as needed. She has not refilled today.  -Follow-up in 3 weeks or sooner if needed. Call any questions or concerns in the meantime.Also she can be released for work in 3 weeks. She is able to stand and walk for about 4 hours a day at this point before discomfort. She works approximately 8 hour shifts.  Celesta Gentile, DPM

## 2016-10-17 ENCOUNTER — Encounter: Payer: Self-pay | Admitting: *Deleted

## 2016-10-18 ENCOUNTER — Telehealth: Payer: Self-pay | Admitting: *Deleted

## 2016-10-18 ENCOUNTER — Encounter: Payer: Self-pay | Admitting: Podiatry

## 2016-10-18 NOTE — Telephone Encounter (Signed)
Called patient and stated that I was working on her work note and it will be ready in about 30 minutes and I would e-mail it to [email or web address]

## 2016-10-24 ENCOUNTER — Telehealth: Payer: Self-pay | Admitting: *Deleted

## 2016-10-24 NOTE — Progress Notes (Signed)
Referring: Precious Haws MD  HPI: 55 year old female for evaluation of palpitations. Seen in the emergency room June 2017 with palpitations. ECG with sinus tachycardia and nonspecific ST changes. Troponin and d-dimer normal. Hemoglobin 13.9. 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 Lower extremity venous Dopplers August 2017 showed no DVT. Echocardiogram August 2017 showed normal LV systolic function. Patient has noted elevated heart rate recently occasionally at 92-110. In October she had brief palpitations described as a flutter and not sustained. She had a second episode but has had none since. She denies chest pain or syncope. She has dyspnea with more moderate activities but not routine activities. There is no orthopnea, PND but there is minimal pedal edema by her report. Because of the above we were asked to evaluate.   Current Outpatient Prescriptions  Medication Sig Dispense Refill  . acetaminophen (TYLENOL) 325 MG tablet Take 650 mg by mouth every 6 (six) hours as needed.    Marland Kitchen albuterol (PROVENTIL HFA;VENTOLIN HFA) 108 (90 BASE) MCG/ACT inhaler Inhale 1 puff into the lungs every 30 (thirty) minutes as needed for wheezing or shortness of breath.    . Armodafinil (NUVIGIL) 250 MG tablet Take 250 mg by mouth daily.    . Ascorbic Acid (VITAMIN C) 1000 MG tablet Take 1,000 mg by mouth daily.    Marland Kitchen aspirin EC 81 MG tablet Take 1 tablet (81 mg total) by mouth daily. 30 tablet 1  . Bioflavonoid Products (GRAPE SEED PO) Take by mouth. Grape seed with resveratrol    . Cholecalciferol (VITAMIN D3) 1000 units CAPS Take by mouth daily.    . Estradiol (VAGIFEM) 10 MCG TABS vaginal tablet Place vaginally as directed.    . Ferrous Sulfate (IRON) 325 (65 Fe) MG TABS Take by mouth daily.    Marland Kitchen ibuprofen (ADVIL,MOTRIN) 600 MG tablet Take 1 tablet (600 mg total) by mouth every 6 (six) hours as needed. 90 tablet 0  . LORazepam  (ATIVAN) 1 MG tablet Take 1 tablet (1 mg total) by mouth 3 (three) times daily as needed for anxiety. (Patient taking differently: Take 0.5 mg by mouth 3 (three) times daily as needed for anxiety. ) 15 tablet 0  . magnesium oxide (MAG-OX) 400 MG tablet Take 400 mg by mouth daily.    . mupirocin ointment (BACTROBAN) 2 % Apply 1 application topically 2 (two) times daily. 30 g 2  . NON FORMULARY C-Medicare scar cream--Use as directed    . triamcinolone (KENALOG) 0.025 % ointment Apply 1 application topically 2 (two) times daily. 30 g 0  . vitamin B-12 (CYANOCOBALAMIN) 1000 MCG tablet Take 1,000 mcg by mouth daily.     No current facility-administered medications for this visit.     Allergies  Allergen Reactions  . Other Other (See Comments)    Glutein - puts to sleep -teramines - Elevated heart rate  . Lactose Intolerance (Gi) Other (See Comments)    Upset stomach     Past Medical History:  Diagnosis Date  . ADHD (attention deficit hyperactivity disorder)   . Asthma    rarely uses inhaler  . Depression   . History of kidney stones   . Panic attacks   . Sleep disorder    tx with nuvigal    Past Surgical History:  Procedure Laterality Date  . COLONOSCOPY WITH PROPOFOL N/A 01/11/2015   Procedure: COLONOSCOPY WITH PROPOFOL;  Surgeon: Garlan Fair, MD;  Location: [facility];  Service:  Endoscopy;  Laterality: N/A;  . HYSTEROSCOPY W/D&C N/A 03/05/2016   Procedure: DILATATION AND CURETTAGE /HYSTEROSCOPY;  Surgeon: Vanessa Kick, MD;  Location: [facility];  Service: Gynecology;  Laterality: N/A;  . kdiney stone surgery  12/2015  . LIPOSUCTION    . TENDON REPAIR Left 06/20/2016   foot  . WISDOM TOOTH EXTRACTION      Social History   Social History  . Marital status: Single    Spouse name: N/A  . Number of children: N/A  . Years of education: N/A   Occupational History  . Freight forwarder at [employer]  . Smoking status: Never Smoker  . Smokeless tobacco:  Never Used  . Alcohol use No  . Drug use: No  . Sexual activity: Yes    Birth control/ protection: Post-menopausal   Other Topics Concern  . Not on file   Social History Narrative  . No narrative on file    Family History  Problem Relation Age of Onset  . Alzheimer's disease Mother   . Alzheimer's disease Father     ROS: Recent nosebleed but no fevers or chills, productive cough, hemoptysis, dysphasia, odynophagia, melena, hematochezia, dysuria, hematuria, rash, seizure activity, orthopnea, PND, pedal edema, claudication. Remaining systems are negative.  Physical Exam:   Blood pressure 138/79, pulse 87, height 5\' 4"  (1.626 m), weight 182 lb (82.6 kg).  General:  Well developed/well nourished in NAD Skin warm/dry Patient not depressed No peripheral clubbing Back-normal HEENT-normal/normal eyelids Neck supple/normal carotid upstroke bilaterally; no bruits; no JVD; no thyromegaly chest - CTA/ normal expansion CV - RRR/normal S1 and S2; no murmurs, rubs or gallops;  PMI nondisplaced Abdomen -NT/ND, no HSM, no mass, + bowel sounds, no bruit 2+ femoral pulses, no bruits Ext-no edema, chords, 2+ DP Neuro-grossly nonfocal  ECG-normal sinus rhythm at a rate of 87. No significant ST changes.  A/P  1 palpitations-patient had brief palpitations that sound likely to be PACs or PVCs. They are very infrequent. TSH in June normal. LV function normal. If they worsen in the future we could consider adding a beta blocker. I explained that she could purchase alivecor for better documentation of rhythm disturbance associated with palpitations.  2 Depression/anxiety-management per primary care.   Kirk Ruths, MD

## 2016-10-24 NOTE — Telephone Encounter (Signed)
OK thanks. If it worsens to hold off on PT and come in sooner.

## 2016-10-24 NOTE — Telephone Encounter (Addendum)
Tanya Peterson called to leave information concerning pt. I took message from [facility] and he states initially pt made excellent improvement, then in the last 1 - 1 1/2 weeks her pain level has increased unusually, this was after completing 6day prednisone. Tanya Peterson will see her Friday and then again Monday, before she sees Dr. Jacqualyn Posey and wanted to let him know, Tanya Peterson states he doesn't think anything is wrong but wanted to keep Dr. Jacqualyn Posey informed. 10/25/2016-Left message with [facility] with Dr. Leigh Aurora 10/24/2016 orders to give to [facility].

## 2016-10-25 ENCOUNTER — Encounter: Payer: Self-pay | Admitting: Cardiology

## 2016-10-26 ENCOUNTER — Telehealth: Payer: Self-pay | Admitting: *Deleted

## 2016-10-26 NOTE — Telephone Encounter (Addendum)
Pt states pt's [employer] needs a statement stating she needs to continue PT after her 25 visit, and it needs to be pre-dated to 10/09/2016, fax to [place] [phone]. 10/30/2016-Letter with Dr. Leigh Aurora requirements faxed to [place]. Letter for continuation of PT faxed to [place] Department.

## 2016-10-29 ENCOUNTER — Encounter: Payer: Self-pay | Admitting: Podiatry

## 2016-10-29 ENCOUNTER — Encounter: Payer: Self-pay | Admitting: *Deleted

## 2016-10-29 NOTE — Telephone Encounter (Signed)
She is continuing to have pain with prolonged activity. In order for her to return to work she needs to be able to go back with no restrictions. She needs to continue to work on going up and down steps/ladders, walking without the boot.

## 2016-10-30 ENCOUNTER — Encounter: Payer: Self-pay | Admitting: Podiatry

## 2016-10-30 ENCOUNTER — Telehealth: Payer: Self-pay | Admitting: *Deleted

## 2016-10-30 ENCOUNTER — Encounter: Payer: Self-pay | Admitting: *Deleted

## 2016-10-30 ENCOUNTER — Ambulatory Visit (INDEPENDENT_AMBULATORY_CARE_PROVIDER_SITE_OTHER): Payer: Managed Care, Other (non HMO) | Admitting: Podiatry

## 2016-10-30 DIAGNOSIS — M25372 Other instability, left ankle: Secondary | ICD-10-CM | POA: Diagnosis not present

## 2016-10-30 DIAGNOSIS — T148XXA Other injury of unspecified body region, initial encounter: Secondary | ICD-10-CM | POA: Diagnosis not present

## 2016-10-30 DIAGNOSIS — M779 Enthesopathy, unspecified: Secondary | ICD-10-CM

## 2016-10-30 NOTE — Progress Notes (Signed)
Subjective: Tanya Peterson is a 55 y.o. is seen today in office s/p left peroneal tendon repair and ATFL repair preformed on 06/20/16. She has been continue with physical therapy. She states that overall she is doing better and pain is improving. She did have an increase in pain to the outside aspect of her ankle while on the balance board at physical therapy. She states her swelling has much improved. She is not taking pain medicine this time. Denies any redness or warmth. She is able to wear regular shoe. She states that she feels that she go back to work today but any restrictions. She has been wearing her orthotics. Denies any systemic complaints such as fevers, chills, nausea, vomiting. No calf pain, chest pain, shortness of breath.    Objective: General: No acute distress, AAOx3  DP/PT pulses palpable 2/4, CRT < 3 sec to all digits.  Protective sensation intact. Motor function intact.  Presents in CAM boot.  Left foot: Incision is well coapted without any evidence of dehiscence and a scar has formed. There is mild tenderness on the course the peroneal tendon on the ATFL. There is minimal swelling to the area. There is no erythema or increase in warmth. There is no gross ankle instability. No pain with ankle or STJ ROM. No other areas of tenderness bilaterally.  No other areas of tenderness bilaterally.  No pain with calf compression, swelling, warmth, erythema.   Assessment and Plan:  Status post left ankle surgery, improving    -Treatment options discussed including all alternatives, risks, and complications -this time I recommended continue with physical therapy. Continue with orthotics and supportive shoe gear as well. At this time she feels that she is able to go back to work with our restrictions I will release her without restrictions. I would like for her to ice as much as possible. Anti-inflammatories as needed as well. -Follow-up in 4 weeks or sooner if any problems arise. In the  meantime, encouraged to call the office with any questions, concerns, change in symptoms.   Celesta Gentile, DPM

## 2016-10-30 NOTE — Telephone Encounter (Signed)
Faxed over the [place] form yesterday to the following fax number [phone] and the fax was confirmed file no. [id number]. At 08:18. Lattie Haw

## 2016-10-31 ENCOUNTER — Ambulatory Visit (INDEPENDENT_AMBULATORY_CARE_PROVIDER_SITE_OTHER): Payer: Managed Care, Other (non HMO) | Admitting: Cardiology

## 2016-10-31 ENCOUNTER — Encounter: Payer: Self-pay | Admitting: Cardiology

## 2016-10-31 VITALS — BP 138/79 | HR 87 | Ht 64.0 in | Wt 182.0 lb

## 2016-10-31 DIAGNOSIS — R002 Palpitations: Secondary | ICD-10-CM

## 2016-10-31 NOTE — Patient Instructions (Signed)
Your physician recommends that you schedule a follow-up appointment in: AS NEEDED  

## 2016-11-27 ENCOUNTER — Ambulatory Visit (INDEPENDENT_AMBULATORY_CARE_PROVIDER_SITE_OTHER): Payer: Managed Care, Other (non HMO) | Admitting: Podiatry

## 2016-11-27 ENCOUNTER — Encounter: Payer: Self-pay | Admitting: Podiatry

## 2016-11-27 DIAGNOSIS — M779 Enthesopathy, unspecified: Secondary | ICD-10-CM | POA: Diagnosis not present

## 2016-11-27 NOTE — Progress Notes (Signed)
Subjective: Tanya Peterson is a 56 y.o. is seen today in office s/p left peroneal tendon repair and ATFL repair preformed on 06/20/16. She is continue physical therapy and she is return to work. Since return to work wearing a regular shoe in a brace she gets some occasional numbness to her big toe and her second toe or left foot. She states that her range of motion has improved and she will likely be finishing physical therapy this week. She still gets pain to the area at times after doing a lot of walking and standing in the area still swells intermittently. She's been fell about the same symptoms the right foot even before the surgery and the left foot. She does continue with the brace on that side to help prevent this from happening. Denies any systemic complaints such as fevers, chills, nausea, vomiting. No calf pain, chest pain, shortness of breath.    Objective: General: No acute distress, AAOx3  DP/PT pulses palpable 2/4, CRT < 3 sec to all digits.  Protective sensation intact. Motor function intact.  Presents in CAM boot.  Left foot: Incision is well coapted without any evidence of dehiscence and a scar has formed. There is mild tenderness on the course the peroneal tendon on the ATFL.the majority symptoms today appear to be in the lateral aspect of the foot on the sinus tarsi. There is no pain or restriction with subtalar joint range of motion. She does have mild discomfort with eversion of the foot. There is no other areas of pinpoint bony tenderness or pain the vibratory sensation. No other areas of tenderness bilaterally.  No pain with calf compression, swelling, warmth, erythema.   Assessment and Plan:  Status post left ankle surgery, improving    -Treatment options discussed including all alternatives, risks, and complications -given the pain in size tarsi discussed a steroid injection into the area and she wishes to proceed with this. Under sterile conditions a mixture of dexamethasone and  local anesthetic was infiltrated into the area of maximal tenderness to the lateral aspect of left little and sinus tarsi without complications. Post injection care was discussed. -Continue physical therapy until discharge. Once discharged continue with home regimen. -Continue with ankle brace as needed. -Ice to the area as well. -The neuritis that she is having seems of certain shoe and back to work. This likely is from compression from the boot and brace. I would like for her to try to wean off wearing the brace as well. -NSAIDs prn  Celesta Gentile, DPM

## 2016-12-11 ENCOUNTER — Ambulatory Visit (INDEPENDENT_AMBULATORY_CARE_PROVIDER_SITE_OTHER): Payer: Managed Care, Other (non HMO) | Admitting: Podiatry

## 2016-12-11 ENCOUNTER — Encounter: Payer: Self-pay | Admitting: Podiatry

## 2016-12-11 DIAGNOSIS — M792 Neuralgia and neuritis, unspecified: Secondary | ICD-10-CM | POA: Diagnosis not present

## 2016-12-11 DIAGNOSIS — M779 Enthesopathy, unspecified: Secondary | ICD-10-CM

## 2016-12-11 MED ORDER — GABAPENTIN 100 MG PO CAPS: 100.0000 mg | ORAL_CAPSULE | Freq: Every day | ORAL | 3 refills | Status: DC

## 2016-12-11 NOTE — Progress Notes (Signed)
Subjective: Tanya Peterson is a 56 y.o. is seen today in office s/p left peroneal tendon repair and ATFL repair preformed on 06/20/16. His last appointment she has been discharged from physical therapy. She states that she is doing better but she still having some pain also aspect of the ankle. She does work in wearing a regular shoe without an ankle brace. She states that she walked 5-1/2 miles yesterday. Still gets some occasional swelling but this is improved.She also is still getting some numbness to her big and 2nd toe. She states that she had it right after surgery and believes it went away and then came back. Denies any redness or warmth. Denies any systemic complaints such as fevers, chills, nausea, vomiting. No calf pain, chest pain, shortness of breath.    Objective: General: No acute distress, AAOx3  DP/PT pulses palpable 2/4, CRT < 3 sec to all digits.  Protective sensation intact. Motor function intact.  Presents in CAM boot.  Left foot: Incision is well coapted without any evidence of dehiscence and a scar has formed.the area of tenderness that she's experienced is n the distal portion of the incision inferior to this. There is no significant tenderness on the course of the peroneal tendon. There is minimal discomfort on the course the ATFL. There is no gross ankle instability present. There is trace edema to the area and there is no erythema or increase in warmth. There is no pain with ankle or subtalar joint range of motion. No other areas of tenderness bilaterally.  No pain with calf compression, swelling, warmth, erythema.   Assessment and Plan:  Status post left ankle surgery, improving ; neuritis   -Treatment options discussed including all alternatives, risks, and complications -Discussed a steroid injection along the area of maximal tenderness to the lateral foot. This is along the distal portion of the incision site. Understanding risks and complications and mixture of Celestone as  well as Marcaine plain was infiltrated into the area of maximal tenderness without complications. Post injection care was discussed. Recommended ice today as well as wearing her ankle brace . -Will try gabapentin on a trial basis. We'll start 100 mg at nighttime and will titrate up as needed. Discussed side affects of the medication.  -Continue with home rehab. She has been discharged from PT. Overall she is walking at work without a brace and walking several miles. She expresses frustration over her job in general but she can work a full shift.  -Follow-up in 4 weeks or sooner if any problems arise. In the meantime, encouraged to call the office with any questions, concerns, change in symptoms.   Celesta Gentile, DPM

## 2017-01-08 ENCOUNTER — Ambulatory Visit (INDEPENDENT_AMBULATORY_CARE_PROVIDER_SITE_OTHER): Payer: Managed Care, Other (non HMO) | Admitting: Podiatry

## 2017-01-08 ENCOUNTER — Encounter: Payer: Self-pay | Admitting: Podiatry

## 2017-01-08 DIAGNOSIS — M779 Enthesopathy, unspecified: Secondary | ICD-10-CM

## 2017-01-08 MED ORDER — TRIAMCINOLONE ACETONIDE 10 MG/ML IJ SUSP
10.0000 mg | Freq: Once | INTRAMUSCULAR | Status: AC
  Administered 2017-01-08: 10 mg

## 2017-01-08 NOTE — Progress Notes (Signed)
Subjective: Tanya Peterson is a 56 y.o. is seen today in office today for follow-up. He states that Tanya Peterson is doing better to her ankle and Tanya Peterson is able to work all day, exercise and walk without significant pain. Tanya Peterson does point to one area of pain and Tanya Peterson points the lateral aspect of the foot on the sinus tarsi which is the majority of her symptoms Tanya Peterson describes as a deep, throbbing pain at times. Tanya Peterson is on her feet and Tanya Peterson is able to work an entire day at work. Tanya Peterson does with ankle brace which helps. The injection did help for a couple of day. No recent injury or new concerns. Denies any redness or warmth. Denies any systemic complaints such as fevers, chills, nausea, vomiting. No calf pain, chest pain, shortness of breath.    Objective: General: No acute distress, AAOx3  DP/PT pulses palpable 2/4, CRT < 3 sec to all digits.  Protective sensation intact. Motor function intact.  Presents in CAM boot.  Left foot: Incision is well coapted without any evidence of dehiscence and a scar has formed.metatarsal and the scar or along the peroneal tendons. Her area of tenderness is localized to the left foot on the lateral aspect on the sinus tarsi. There is no significant edema to this area and there is no erythema or increase in warmth. There is no crepitation with subtalar joint range of motion. There is no pain with ankle joint range of motion. No other areas of tenderness identified bilaterally. Subjectively still having some tingling to her hallux and 2nd toe. Tanya Peterson states Tanya Peterson can feel them and walk normal but Tanya Peterson notices a tingling sensation.  No other areas of tenderness bilaterally.  No pain with calf compression, swelling, warmth, erythema.   Assessment and Plan:  Capsulitis left subtalar joint.    -Treatment options discussed including all alternatives, risks, and complications -today a repeat steroid injection was performed in the sinus tarsi and the left foot without complications. Under sterile  conditions a mixture of Kenalog as well as local anesthetic was infiltrated along the area of tenderness without complications. Post injection care was discussed. -Will continue to monitor the neuritis symptoms.  -Continue with ankle brace as needed and home exercise/rehabilitationprogram. Ice to the area. -RTC in 6 weeks if symptoms continue or sooner if needed.   Celesta Gentile, DPM

## 2017-02-19 ENCOUNTER — Ambulatory Visit (INDEPENDENT_AMBULATORY_CARE_PROVIDER_SITE_OTHER): Payer: Managed Care, Other (non HMO) | Admitting: Podiatry

## 2017-02-19 DIAGNOSIS — M779 Enthesopathy, unspecified: Secondary | ICD-10-CM

## 2017-02-20 NOTE — Progress Notes (Signed)
Subjective: 56 year old female presents the office today for follow-up evaluation of subtalar joint capsulitis on the left foot. She states the injection did help quite a bit however send wear off and she started some recurrence of the pain. She also states that she is getting pain on the right side at the same area. . She does wear the orthotics that were made for her but intermittently she changes the mallet over-the-counter inserts. She is asking about possible taping the right foot likely begin physical therapy to see if this will help. She denies any recent injury or trauma. She says the numbness to her left big toe and second toe she's having previously is much improved and only has the symptoms intermittently. Denies any systemic complaints such as fevers, chills, nausea, vomiting. No acute changes since last appointment, and no other complaints at this time.   Objective: AAO x3, NAD DP/PT pulses palpable bilaterally, CRT less than 3 seconds On the left side there is a well-healed scar present. There is mild recurrence of tenderness along the sinus tarsi the left foot. There is no pain along the course the peroneal tendons today. No pain with ankle joint range of motion. On the right side there is tenderness on the similar area there is mild local edema to the lateral ankle just inferior to the lateral malleolus and along the sinus tarsi. There is no erythema or increase in warmth. There is no pain with subtalar range of motion bilaterally.  No open lesions or pre-ulcerative lesions.  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  -Plantar fascial tapings applied on the right foot foot distraction applied from medial to lateral to help take pressure off the lateral aspect of the foot. I  discussed the continued custom inserts. I'll attempt, and be evaluated by Liliane Channel to see about any modifications that we can do to help with her foot pain as this is a bilateral issue at this time. -Also discussed possible steroid injection in the future and left sided symptoms are worsening. -Patient encouraged to call the office with any questions, concerns, change in symptoms.   Celesta Gentile, DPM

## 2017-02-28 ENCOUNTER — Emergency Department (HOSPITAL_BASED_OUTPATIENT_CLINIC_OR_DEPARTMENT_OTHER)
Admission: EM | Admit: 2017-02-28 | Discharge: 2017-03-01 | Disposition: A | Discharge status: Home / Self Care | Payer: Managed Care, Other (non HMO) | Attending: Emergency Medicine | Admitting: Emergency Medicine

## 2017-02-28 ENCOUNTER — Emergency Department (HOSPITAL_BASED_OUTPATIENT_CLINIC_OR_DEPARTMENT_OTHER): Payer: Managed Care, Other (non HMO)

## 2017-02-28 ENCOUNTER — Encounter (HOSPITAL_BASED_OUTPATIENT_CLINIC_OR_DEPARTMENT_OTHER): Payer: Self-pay | Admitting: Emergency Medicine

## 2017-02-28 DIAGNOSIS — F909 Attention-deficit hyperactivity disorder, unspecified type: Secondary | ICD-10-CM | POA: Diagnosis not present

## 2017-02-28 DIAGNOSIS — J45909 Unspecified asthma, uncomplicated: Secondary | ICD-10-CM | POA: Diagnosis not present

## 2017-02-28 DIAGNOSIS — R072 Precordial pain: Secondary | ICD-10-CM | POA: Diagnosis present

## 2017-02-28 DIAGNOSIS — Z7982 Long term (current) use of aspirin: Secondary | ICD-10-CM | POA: Insufficient documentation

## 2017-02-28 DIAGNOSIS — R0789 Other chest pain: Secondary | ICD-10-CM | POA: Diagnosis not present

## 2017-02-28 DIAGNOSIS — R079 Chest pain, unspecified: Secondary | ICD-10-CM

## 2017-02-28 HISTORY — DX: Arterial fibromuscular dysplasia: I77.3

## 2017-02-28 HISTORY — DX: Bipolar disorder, unspecified: F31.9

## 2017-02-28 LAB — COMPREHENSIVE METABOLIC PANEL
ALT: 18 U/L (ref 14–54)
ANION GAP: 7 (ref 5–15)
AST: 22 U/L (ref 15–41)
Albumin: 4.3 g/dL (ref 3.5–5.0)
Alkaline Phosphatase: 100 U/L (ref 38–126)
BUN: 21 mg/dL — ABNORMAL HIGH (ref 6–20)
CHLORIDE: 102 mmol/L (ref 101–111)
CO2: 27 mmol/L (ref 22–32)
Calcium: 9.5 mg/dL (ref 8.9–10.3)
Creatinine, Ser: 0.53 mg/dL (ref 0.44–1.00)
GFR calc non Af Amer: >60 mL/min (ref >60)
Glucose, Bld: 92 mg/dL (ref 65–99)
Potassium: 3.6 mmol/L (ref 3.5–5.1)
SODIUM: 136 mmol/L (ref 135–145)
TOTAL PROTEIN: 7.5 g/dL (ref 6.5–8.1)
Total Bilirubin: 0.4 mg/dL (ref 0.3–1.2)

## 2017-02-28 LAB — CBC WITH DIFFERENTIAL/PLATELET
Basophils Absolute: 0 10*3/uL (ref 0.0–0.1)
Basophils Relative: 0 %
EOS ABS: 0.2 10*3/uL (ref 0.0–0.7)
EOS PCT: 3 %
HCT: 40.7 % (ref 36.0–46.0)
Hemoglobin: 13.7 g/dL (ref 12.0–15.0)
Lymphocytes Relative: 48 %
Lymphs Abs: 3.7 10*3/uL (ref 0.7–4.0)
MCH: 30.9 pg (ref 26.0–34.0)
MCHC: 33.7 g/dL (ref 30.0–36.0)
MCV: 91.7 fL (ref 78.0–100.0)
Monocytes Absolute: 0.7 10*3/uL (ref 0.1–1.0)
Monocytes Relative: 9 %
Neutro Abs: 3.1 10*3/uL (ref 1.7–7.7)
Neutrophils Relative %: 40 %
PLATELETS: 368 10*3/uL (ref 150–400)
RBC: 4.44 MIL/uL (ref 3.87–5.11)
RDW: 12.6 % (ref 11.5–15.5)
WBC: 7.7 10*3/uL (ref 4.0–10.5)

## 2017-02-28 LAB — TROPONIN I

## 2017-02-28 LAB — D-DIMER, QUANTITATIVE: D-Dimer, Quant: 0.36 ug/mL-FEU (ref 0.00–0.50)

## 2017-02-28 MED ORDER — SODIUM CHLORIDE 0.9 % IV BOLUS (SEPSIS)
1000.0000 mL | Freq: Once | INTRAVENOUS | Status: AC
  Administered 2017-02-28: 1000 mL via INTRAVENOUS

## 2017-02-28 MED ORDER — NITROGLYCERIN 2 % TD OINT
1.0000 [in_us] | TOPICAL_OINTMENT | Freq: Once | TRANSDERMAL | Status: AC
  Administered 2017-02-28: 1 [in_us] via TOPICAL
  Filled 2017-02-28: qty 1

## 2017-02-28 MED ORDER — ASPIRIN 81 MG PO CHEW
324.0000 mg | CHEWABLE_TABLET | Freq: Once | ORAL | Status: AC
  Administered 2017-02-28: 324 mg via ORAL
  Filled 2017-02-28: qty 4

## 2017-02-28 NOTE — ED Notes (Signed)
Pt states was at work started having cp, sob radiating to left jaw and nausea lasted about 30 min,  At time of pain rated 7/10,  Denies pain at present

## 2017-02-28 NOTE — ED Provider Notes (Signed)
[facility] Provider Note   CSN: [id number] Arrival date & time: 02/28/17  2137  By signing my name below, I, Hansel Feinstein, attest that this documentation has been prepared under the direction and in the presence of Fatima Blank, MD. Electronically Signed: Hansel Feinstein, ED Scribe. 02/28/17. 10:40 PM.    History   Chief Complaint Chief Complaint  Patient presents with  . Chest Pain    HPI Tanya Peterson is a 56 y.o. female who presents to the Emergency Department complaining of moderate, constant, sharp substernal CP that began an hour ago and lasted a few minutes. Pt also states she became SOB prior to the onset of her pain. She denies pain radiation to the neck or arm. No worsening factors noted. She also notes some left jaw tightness that began while waiting, as well as nausea and diaphoresis with initial onset of her pain. Pt states her jaw pain is improving and her nausea and diaphoresis has resolved. No recent surgeries, prolonged travel, exogenous estrogen use. No h/o heart disease, HTN, HLD, MI, DM, PE/DVT, cancer. No FHx of sudden cardiac death, CAD/CHF < 32 yo. Pt is a non-smoker. Last stress test was ~6 years ago. No h/o cardiac cath. No recent fevers or infections. She denies rhinorrhea, cough, congestion, vomiting, abdominal pain, diarrhea, unilateral leg swelling.   The history is provided by the patient. No language interpreter was used.    Past Medical History:  Diagnosis Date  . ADHD (attention deficit hyperactivity disorder)   . Asthma    rarely uses inhaler  . Bipolar affective disorder ([place])   . Depression   . 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 History of kidney stones   . Panic attacks   . Sleep disorder    tx with nuvigal    Patient Active Problem List   Diagnosis Date Noted  . ADHD (attention deficit hyperactivity disorder) 07/05/2016  . Neurologic complaint, functional 07/05/2016  . S/P tendon repair 07/05/2016     Past Surgical History:  Procedure Laterality Date  . COLONOSCOPY WITH PROPOFOL N/A 01/11/2015   Procedure: COLONOSCOPY WITH PROPOFOL;  Surgeon: Garlan Fair, MD;  Location: [facility];  Service: Endoscopy;  Laterality: N/A;  . HYSTEROSCOPY W/D&C N/A 03/05/2016   Procedure: DILATATION AND CURETTAGE /HYSTEROSCOPY;  Surgeon: Vanessa Kick, MD;  Location: [facility];  Service: Gynecology;  Laterality: N/A;  . kdiney stone surgery  12/2015  . LIPOSUCTION    . TENDON REPAIR Left 06/20/2016   foot  . WISDOM TOOTH EXTRACTION      OB History    No data available       Home Medications    Prior to Admission medications   Medication Sig Start Date End Date Taking? Authorizing Provider  acetaminophen (TYLENOL) 325 MG tablet Take 650 mg by mouth every 6 (six) hours as needed.    Historical Provider, MD  albuterol (PROVENTIL HFA;VENTOLIN HFA) 108 (90 BASE) MCG/ACT inhaler Inhale 1 puff into the lungs every 30 (thirty) minutes as needed for wheezing or shortness of breath.    Historical Provider, MD  Ascorbic Acid (VITAMIN C) 1000 MG tablet Take 1,000 mg by mouth daily.    Historical Provider, MD  aspirin EC 81 MG tablet Take 1 tablet (81 mg total) by mouth daily. 04/27/16   Margarita Mail, PA-C  Bioflavonoid Products (GRAPE SEED PO) Take by mouth. Grape seed with resveratrol    Historical Provider, MD  Cholecalciferol (VITAMIN D3) 1000 units CAPS Take  by mouth daily.    Historical Provider, MD  Ferrous Sulfate (IRON) 325 (65 Fe) MG TABS Take by mouth daily.    Historical Provider, MD  LORazepam (ATIVAN) 1 MG tablet Take 1 tablet (1 mg total) by mouth 3 (three) times daily as needed for anxiety. Patient taking differently: Take 0.5 mg by mouth 3 (three) times daily as needed for anxiety.  08/29/15   Kristen N Ward, DO  magnesium oxide (MAG-OX) 400 MG tablet Take 400 mg by mouth daily.    Historical Provider, MD  NON FORMULARY C-Medicare scar cream--Use as directed    Historical Provider, MD   triamcinolone (KENALOG) 0.025 % ointment Apply 1 application topically 2 (two) times daily. 08/07/16   Trula Slade, DPM  vitamin B-12 (CYANOCOBALAMIN) 1000 MCG tablet Take 1,000 mcg by mouth daily.    Historical Provider, MD    Family History Family History  Problem Relation Age of Onset  . Alzheimer's disease Mother   . Alzheimer's disease Father     Social History Social History  Substance Use Topics  . Smoking status: Never Smoker  . Smokeless tobacco: Never Used  . Alcohol use No     Allergies   Other and Lactose intolerance (gi)   Review of Systems Review of Systems A complete 10 system review of systems was obtained and all systems are negative except as noted in the HPI and PMH.    Physical Exam Updated Vital Signs BP (!) 141/60 (BP Location: Left Arm)   Pulse (!) 113   Temp 99.2 F (37.3 C) (Oral)   Resp 20   Ht 5\' 4"  (1.626 m)   Wt 179 lb (81.2 kg)   SpO2 99%   BMI 30.73 kg/m   Physical Exam  Constitutional: She is oriented to person, place, and time. She appears well-developed and well-nourished. No distress.  HENT:  Head: Normocephalic and atraumatic.  Nose: Nose normal.  Eyes: Conjunctivae and EOM are normal. Pupils are equal, round, and reactive to light. Right eye exhibits no discharge. Left eye exhibits no discharge. No scleral icterus.  Neck: Normal range of motion. Neck supple.  Cardiovascular: Normal rate, regular rhythm and normal heart sounds.  Exam reveals no gallop and no friction rub.   No murmur heard. Pulmonary/Chest: Effort normal and breath sounds normal. No stridor. No respiratory distress. She has no wheezes. She has no rales.  Abdominal: Soft. She exhibits no distension. There is no tenderness.  Musculoskeletal: She exhibits no edema or tenderness.  Neurological: She is alert and oriented to person, place, and time.  Skin: Skin is warm and dry. No rash noted. She is not diaphoretic. No erythema.  Psychiatric: She has a normal  mood and affect.  Vitals reviewed.    ED Treatments / Results   DIAGNOSTIC STUDIES: Oxygen Saturation is 99% on RA, normal by my interpretation.    COORDINATION OF CARE: 10:36 PM Discussed treatment plan with pt at bedside which includes labs, CXR and pt agreed to plan.    Labs (all labs ordered are listed, but only abnormal results are displayed) Labs Reviewed  COMPREHENSIVE METABOLIC PANEL - Abnormal; Notable for the following:       Result Value   BUN 21 (*)    All other components within normal limits  CBC WITH DIFFERENTIAL/PLATELET  TROPONIN I    EKG  EKG Interpretation None       Radiology Dg Chest 2 View  Result Date: 02/28/2017 CLINICAL DATA:  Chest pain and  dyspnea radiating to the left jaw. EXAM: CHEST  2 VIEW COMPARISON:  05/03/2016 CXR FINDINGS: The heart size and mediastinal contours are within normal limits. Both lungs are clear. The visualized skeletal structures are unremarkable. IMPRESSION: No active cardiopulmonary disease. Electronically Signed   By: Ashley Royalty M.D.   On: 02/28/2017 22:27    Procedures Procedures (including critical care time)  Medications Ordered in ED Medications - No data to display   Initial Impression / Assessment and Plan / ED Course  I have reviewed the triage vital signs and the nursing notes.  Pertinent labs & imaging results that were available during my care of the patient were reviewed by me and considered in my medical decision making (see chart for details).     Atypical chest pain. EKG without acute ischemic changes or evidence of pericarditis. Initial troponin negative. HEAR <3. Feel she is appropriate for delta troponin.  D-dimer negative. Doubt pulmonary embolism. Presentation not classic for aortic dissection or esophageal perforation.  Chest x-ray without evidence suggestive of pneumonia, pneumothorax, pneumomediastinum.  No abnormal contour of the mediastinum to suggest dissection. No evidence of acute  injuries.  Patient care turned over to Dr Rubye Oaks at 0000. Patient case and results discussed in detail; please see their note for further ED managment.      Final Clinical Impressions(s) / ED Diagnoses   Final diagnoses:  Atypical chest pain   I personally performed the services described in this documentation, which was scribed in my presence. The recorded information has been reviewed and is accurate.        Fatima Blank, MD 02/28/17 [phone]

## 2017-02-28 NOTE — ED Triage Notes (Signed)
c/o CP x 20 min-NAD-steady gait

## 2017-02-28 NOTE — ED Notes (Signed)
c/o Chest pain rates 7/10  md notified

## 2017-02-28 NOTE — ED Triage Notes (Signed)
During triage pt started c/o pain to left jaw

## 2017-03-01 LAB — TROPONIN I

## 2017-03-21 NOTE — Progress Notes (Signed)
HPI: FU palpitations. Seen in the emergency room June 2017 with palpitations. ECG with sinus tachycardia and nonspecific ST changes. Troponin and d-dimer normal. Hemoglobin 13.9. 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 Lower extremity venous Dopplers August 2017 showed no DVT. Echocardiogram August 2017 showed normal LV systolic function. Patient seen in the emergency room April 2018 with chest pain. D-dimer, troponin, hemoglobin and liver functions normal. Since last seen she denies recurrent chest pain. On the day of emergency room evaluation she had pain in her substernal area that was sharp and lasted 2 minutes. There was associated nausea. The pain was not pleuritic or positional and no radiation. She denies exertional chest pain. She had brief palpitations but none since. No syncope.  Current Outpatient Prescriptions  Medication Sig Dispense Refill  . acetaminophen (TYLENOL) 325 MG tablet Take 650 mg by mouth every 6 (six) hours as needed.    Marland Kitchen albuterol (PROVENTIL HFA;VENTOLIN HFA) 108 (90 BASE) MCG/ACT inhaler Inhale 1 puff into the lungs every 30 (thirty) minutes as needed for wheezing or shortness of breath.    . Ascorbic Acid (VITAMIN C) 1000 MG tablet Take 1,000 mg by mouth daily.    Marland Kitchen aspirin EC 81 MG tablet Take 1 tablet (81 mg total) by mouth daily. 30 tablet 1  . Bioflavonoid Products (GRAPE SEED PO) Take by mouth. Grape seed with resveratrol    . Cholecalciferol (VITAMIN D3) 1000 units CAPS Take by mouth daily.    . Estradiol (VAGIFEM VA) Place vaginally as directed.    . Ferrous Sulfate (IRON) 325 (65 Fe) MG TABS Take by mouth daily.    Marland Kitchen LORazepam (ATIVAN) 1 MG tablet Take 1 tablet (1 mg total) by mouth 3 (three) times daily as needed for anxiety. (Patient taking differently: Take 0.5 mg by mouth 3 (three) times daily as needed for anxiety. ) 15 tablet 0  . magnesium oxide (MAG-OX) 400 MG  tablet Take 400 mg by mouth daily.    . NON FORMULARY C-Medicare scar cream--Use as directed    . triamcinolone (KENALOG) 0.025 % ointment Apply 1 application topically 2 (two) times daily. 30 g 0  . vitamin B-12 (CYANOCOBALAMIN) 1000 MCG tablet Take 1,000 mcg by mouth daily.     No current facility-administered medications for this visit.      Past Medical History:  Diagnosis Date  . ADHD (attention deficit hyperactivity disorder)   . Asthma    rarely uses inhaler  . Bipolar affective disorder ([place])   . Depression   . 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 History of kidney stones   . Panic attacks   . Sleep disorder    tx with nuvigal    Past Surgical History:  Procedure Laterality Date  . COLONOSCOPY WITH PROPOFOL N/A 01/11/2015   Procedure: COLONOSCOPY WITH PROPOFOL;  Surgeon: Garlan Fair, MD;  Location: [facility];  Service: Endoscopy;  Laterality: N/A;  . HYSTEROSCOPY W/D&C N/A 03/05/2016   Procedure: DILATATION AND CURETTAGE /HYSTEROSCOPY;  Surgeon: Vanessa Kick, MD;  Location: [facility];  Service: Gynecology;  Laterality: N/A;  . kdiney stone surgery  12/2015  . LIPOSUCTION    . TENDON REPAIR Left 06/20/2016   foot  . WISDOM TOOTH EXTRACTION      Social History   Social History  . Marital status: Single    Spouse name: N/A  . Number of children: N/A  . Years  of education: N/A   Occupational History  . 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 Smoking status: Never Smoker  . Smokeless tobacco: Never Used  . Alcohol use No  . Drug use: No  . Sexual activity: Yes    Birth control/ protection: Post-menopausal   Other Topics Concern  . Not on file   Social History Narrative  . No narrative on file    Family History  Problem Relation Age of Onset  . Alzheimer's disease Mother   . Alzheimer's disease Father     ROS: no fevers or chills, productive cough, hemoptysis, dysphasia, odynophagia, melena, hematochezia, dysuria,  hematuria, rash, seizure activity, orthopnea, PND, pedal edema, claudication. Remaining systems are negative.  Physical Exam: Well-developed well-nourished in no acute distress.  Skin is warm and dry.  HEENT is normal.  Neck is supple. No bruits Chest is clear to auscultation with normal expansion.  Cardiovascular exam is regular rate and rhythm. No murmurs Abdominal exam nontender or distended. No masses palpated. Extremities show no edema. neuro grossly intact  ECG- 02/28/2017-sinus tachycardia with no ST changes. personally reviewed  A/P  1 Palpitations-symptoms are recently well controlled. We can consider a beta blocker in the future if needed.  2 chest pain-I will arrange an exercise treadmill for risk stratification. Symptoms are atypical.  3 depression/anxiety-management per primary care.     Kirk Ruths, MD

## 2017-03-27 ENCOUNTER — Encounter: Payer: Self-pay | Admitting: Cardiology

## 2017-03-27 ENCOUNTER — Ambulatory Visit (INDEPENDENT_AMBULATORY_CARE_PROVIDER_SITE_OTHER): Payer: Managed Care, Other (non HMO) | Admitting: Cardiology

## 2017-03-27 VITALS — BP 124/70 | HR 87 | Ht 64.0 in | Wt 177.0 lb

## 2017-03-27 DIAGNOSIS — R002 Palpitations: Secondary | ICD-10-CM

## 2017-03-27 DIAGNOSIS — R072 Precordial pain: Secondary | ICD-10-CM | POA: Diagnosis not present

## 2017-03-27 NOTE — Patient Instructions (Signed)
Medication Instructions:   NO CHANGE  Testing/Procedures:  Your physician has requested that you have an exercise tolerance test. For further information please visit [email or web address]. Please also follow instruction sheet, as given.    Follow-Up:  Your physician wants you to follow-up in: [place] will receive a reminder letter in the mail two months in advance. If you don't receive a letter, please call our office to schedule the follow-up appointment.    Exercise Stress Electrocardiogram An exercise stress electrocardiogram is a test to check how blood flows to your heart. It is done to find areas of poor blood flow. You will need to walk on a treadmill for this test. The electrocardiogram will record your heartbeat when you are at rest and when you are exercising. What happens before the procedure?  Do not have drinks with caffeine or foods with caffeine for 24 hours before the test, or as told by your doctor. This includes coffee, tea (even decaf tea), sodas, chocolate, and cocoa.  Follow your doctor's instructions about eating and drinking before the test.  Ask your doctor what medicines you should or should not take before the test. Take your medicines with water unless told by your doctor not to.  If you use an inhaler, bring it with you to the test.  Bring a snack to eat after the test.  Do not  smoke for 4 hours before the test.  Do not put lotions, powders, creams, or oils on your chest before the test.  Wear comfortable shoes and clothing. What happens during the procedure?  You will have patches put on your chest. Small areas of your chest may need to be shaved. Wires will be connected to the patches.  Your heart rate will be watched while you are resting and while you are exercising.  You will walk on the treadmill. The treadmill will slowly get faster to raise your heart rate.  The test will take about 1-2 hours. What happens after the  procedure?  Your heart rate and blood pressure will be watched after the test.  You may return to your normal diet, activities, and medicines or as told by your doctor. This information is not intended to replace advice given to you by your health care provider. Make sure you discuss any questions you have with your health care provider. Document Released: 04/23/2008 Document Revised: 07/04/2016 Document Reviewed: 07/13/2013 Elsevier Interactive Patient Education  2017 [employer].

## 2017-04-16 ENCOUNTER — Telehealth (HOSPITAL_COMMUNITY): Payer: Self-pay

## 2017-04-16 NOTE — Telephone Encounter (Signed)
Encounter complete. 

## 2017-04-18 ENCOUNTER — Ambulatory Visit (HOSPITAL_COMMUNITY)
Admission: RE | Admit: 2017-04-18 | Discharge: 2017-04-18 | Disposition: A | Discharge status: Home / Self Care | Payer: Managed Care, Other (non HMO) | Source: Ambulatory Visit | Attending: Cardiovascular Disease | Admitting: Cardiovascular Disease

## 2017-04-18 DIAGNOSIS — R072 Precordial pain: Secondary | ICD-10-CM | POA: Diagnosis not present

## 2017-04-18 LAB — EXERCISE TOLERANCE TEST
CHL CUP RESTING HR STRESS: 79 {beats}/min
CSEPED: 6 min
CSEPEDS: 27 s
CSEPEW: 7.6 METS
CSEPPHR: 141 {beats}/min
MPHR: 165 {beats}/min
Percent HR: 85 %
RPE: 17

## 2017-04-25 ENCOUNTER — Ambulatory Visit (INDEPENDENT_AMBULATORY_CARE_PROVIDER_SITE_OTHER): Payer: Managed Care, Other (non HMO) | Admitting: Podiatry

## 2017-04-25 ENCOUNTER — Ambulatory Visit: Payer: Managed Care, Other (non HMO) | Admitting: Orthotics

## 2017-04-25 DIAGNOSIS — M779 Enthesopathy, unspecified: Secondary | ICD-10-CM

## 2017-04-25 NOTE — Patient Instructions (Signed)

## 2017-04-28 NOTE — Progress Notes (Signed)
Subjective: 56 year old female presents the office today for follow-up evaluation of subtalar joint capsulitis on the left foot. She states that after the injection to the subtalar joint she was doing much better. The pain is slowly started to come back. She states that she still gets some swelling the outside aspect of her ankle on the left foot after she has been working all day. She also gets similar symptoms on the right side. She denies any recent injury or trauma. No redness or warmth that she has noted. Denies any systemic complaints such as fevers, chills, nausea, vomiting. No acute changes since last appointment, and no other complaints at this time.   Objective: AAO x3, NAD DP/PT pulses palpable bilaterally, CRT less than 3 seconds On the left side there is a well-healed scar present. There is turned to be some mild recurrence of pain in the sinus tarsi on the left foot. There is no erythema or increase in warmth there is mild edema. Minimal discomfort along the inferior portion of the peroneal tendon just inferior to the lateral malleolus. The tendon appears to be intact. There is no ankle instability present. The majority of symptoms are from the sinus tarsi. After the previous injection her pain resolved to the left ankle/foot. There is no pain along the course the ATFL. 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  -Today she was requesting another steroid injection in the sinus tarsi. Discussed the risks of repeated steroid injections. Under sterile conditions a mixture of Kenalog and local anesthetic was infiltrated without , occasions. Post injection care was discussed. Liliane Channel also evaluate her for the orthotics  today. They were not fitting appropriately and is in the back for modifications as well. -Patient encouraged to call the office with any questions, concerns, change in symptoms.   Celesta Gentile, DPM

## 2017-05-09 ENCOUNTER — Encounter: Payer: Managed Care, Other (non HMO) | Admitting: Orthotics

## 2017-07-23 ENCOUNTER — Other Ambulatory Visit: Payer: Self-pay | Admitting: Obstetrics and Gynecology

## 2017-07-23 DIAGNOSIS — R928 Other abnormal and inconclusive findings on diagnostic imaging of breast: Secondary | ICD-10-CM

## 2017-07-29 ENCOUNTER — Ambulatory Visit
Admission: RE | Admit: 2017-07-29 | Discharge: 2017-07-29 | Disposition: A | Discharge status: Home / Self Care | Payer: Managed Care, Other (non HMO) | Source: Ambulatory Visit | Attending: Obstetrics and Gynecology | Admitting: Obstetrics and Gynecology

## 2017-07-29 ENCOUNTER — Other Ambulatory Visit: Payer: Self-pay | Admitting: Obstetrics and Gynecology

## 2017-07-29 DIAGNOSIS — R928 Other abnormal and inconclusive findings on diagnostic imaging of breast: Secondary | ICD-10-CM

## 2017-07-29 DIAGNOSIS — N6310 Unspecified lump in the right breast, unspecified quadrant: Secondary | ICD-10-CM

## 2017-08-02 ENCOUNTER — Other Ambulatory Visit: Payer: Managed Care, Other (non HMO)

## 2017-08-05 ENCOUNTER — Ambulatory Visit
Admission: RE | Admit: 2017-08-05 | Discharge: 2017-08-05 | Disposition: A | Discharge status: Home / Self Care | Payer: Managed Care, Other (non HMO) | Source: Ambulatory Visit | Attending: Obstetrics and Gynecology | Admitting: Obstetrics and Gynecology

## 2017-08-05 ENCOUNTER — Other Ambulatory Visit: Payer: Self-pay | Admitting: Obstetrics and Gynecology

## 2017-08-05 DIAGNOSIS — N6310 Unspecified lump in the right breast, unspecified quadrant: Secondary | ICD-10-CM

## 2017-08-07 DIAGNOSIS — I6523 Occlusion and stenosis of bilateral carotid arteries: Secondary | ICD-10-CM | POA: Insufficient documentation

## 2017-08-27 ENCOUNTER — Other Ambulatory Visit: Payer: Self-pay | Admitting: General Surgery

## 2017-08-27 ENCOUNTER — Ambulatory Visit: Payer: Self-pay | Admitting: General Surgery

## 2017-08-27 DIAGNOSIS — D241 Benign neoplasm of right breast: Secondary | ICD-10-CM

## 2017-09-27 ENCOUNTER — Other Ambulatory Visit: Payer: Self-pay

## 2017-09-27 ENCOUNTER — Encounter (HOSPITAL_BASED_OUTPATIENT_CLINIC_OR_DEPARTMENT_OTHER): Payer: Self-pay | Admitting: *Deleted

## 2017-10-01 ENCOUNTER — Ambulatory Visit
Admission: RE | Admit: 2017-10-01 | Discharge: 2017-10-01 | Disposition: A | Discharge status: Home / Self Care | Payer: Managed Care, Other (non HMO) | Source: Ambulatory Visit | Attending: General Surgery | Admitting: General Surgery

## 2017-10-01 DIAGNOSIS — D241 Benign neoplasm of right breast: Secondary | ICD-10-CM

## 2017-10-01 NOTE — Anesthesia Preprocedure Evaluation (Addendum)
Anesthesia Evaluation  Patient identified by MRN, date of birth, ID band Patient awake    Reviewed: Allergy & Precautions, NPO status , Patient's Chart, lab work & pertinent test results  History of Anesthesia Complications (+) PONV  Airway Mallampati: II  TM Distance: >3 FB Neck ROM: Full    Dental  (+) Dental Advisory Given   Pulmonary asthma ,    breath sounds clear to auscultation       Cardiovascular negative cardio ROS   Rhythm:Regular Rate:Normal  Negative Exercise stress test 03/2017   Neuro/Psych Anxiety Depression Bipolar Disorder negative neurological ROS     GI/Hepatic negative GI ROS, Neg liver ROS,   Endo/Other  negative endocrine ROS  Renal/GU negative Renal ROS     Musculoskeletal   Abdominal   Peds  Hematology   Anesthesia Other Findings   Reproductive/Obstetrics                            Lab Results  Component Value Date   WBC 7.7 02/28/2017   HGB 13.7 02/28/2017   HCT 40.7 02/28/2017   MCV 91.7 02/28/2017   PLT 368 02/28/2017   Lab Results  Component Value Date   CREATININE 0.53 02/28/2017   BUN 21 (H) 02/28/2017   NA 136 02/28/2017   K 3.6 02/28/2017   CL 102 02/28/2017   CO2 27 02/28/2017    Anesthesia Physical Anesthesia Plan  ASA: II  Anesthesia Plan: General   Post-op Pain Management:    Induction: Intravenous  PONV Risk Score and Plan: 3 and Ondansetron, Dexamethasone, Midazolam and Treatment may vary due to age or medical condition  Airway Management Planned: LMA  Additional Equipment:   Intra-op Plan:   Post-operative Plan: Extubation in OR  Informed Consent: I have reviewed the patients History and Physical, chart, labs and discussed the procedure including the risks, benefits and alternatives for the proposed anesthesia with the patient or authorized representative who has indicated his/her understanding and acceptance.   Dental  advisory given  Plan Discussed with: CRNA  Anesthesia Plan Comments:        Anesthesia Quick Evaluation

## 2017-10-01 NOTE — Progress Notes (Signed)
Ensure pre surgery drink given with instructions to complete by 0500 dos, pt verbalized understanding. 

## 2017-10-02 ENCOUNTER — Ambulatory Visit (HOSPITAL_BASED_OUTPATIENT_CLINIC_OR_DEPARTMENT_OTHER): Payer: Managed Care, Other (non HMO) | Admitting: Anesthesiology

## 2017-10-02 ENCOUNTER — Ambulatory Visit (HOSPITAL_BASED_OUTPATIENT_CLINIC_OR_DEPARTMENT_OTHER)
Admission: RE | Admit: 2017-10-02 | Discharge: 2017-10-02 | Disposition: A | Discharge status: Home / Self Care | Payer: Managed Care, Other (non HMO) | Source: Ambulatory Visit | Attending: General Surgery | Admitting: General Surgery

## 2017-10-02 ENCOUNTER — Ambulatory Visit
Admission: RE | Admit: 2017-10-02 | Discharge: 2017-10-02 | Disposition: A | Discharge status: Home / Self Care | Payer: Managed Care, Other (non HMO) | Source: Ambulatory Visit | Attending: General Surgery | Admitting: General Surgery

## 2017-10-02 ENCOUNTER — Other Ambulatory Visit: Payer: Self-pay

## 2017-10-02 ENCOUNTER — Encounter (HOSPITAL_BASED_OUTPATIENT_CLINIC_OR_DEPARTMENT_OTHER)
Admission: RE | Disposition: A | Discharge status: Home / Self Care | Payer: Self-pay | Source: Ambulatory Visit | Attending: General Surgery

## 2017-10-02 ENCOUNTER — Encounter (HOSPITAL_BASED_OUTPATIENT_CLINIC_OR_DEPARTMENT_OTHER): Payer: Self-pay

## 2017-10-02 DIAGNOSIS — Z7982 Long term (current) use of aspirin: Secondary | ICD-10-CM | POA: Insufficient documentation

## 2017-10-02 DIAGNOSIS — Z803 Family history of malignant neoplasm of breast: Secondary | ICD-10-CM | POA: Insufficient documentation

## 2017-10-02 DIAGNOSIS — D241 Benign neoplasm of right breast: Secondary | ICD-10-CM | POA: Insufficient documentation

## 2017-10-02 DIAGNOSIS — Z79899 Other long term (current) drug therapy: Secondary | ICD-10-CM | POA: Diagnosis not present

## 2017-10-02 HISTORY — PX: BREAST LUMPECTOMY WITH RADIOACTIVE SEED LOCALIZATION: SHX6424

## 2017-10-02 HISTORY — DX: Nausea with vomiting, unspecified: R11.2

## 2017-10-02 HISTORY — DX: Adverse effect of unspecified anesthetic, initial encounter: T41.45XA

## 2017-10-02 HISTORY — DX: Other complications of anesthesia, initial encounter: T88.59XA

## 2017-10-02 HISTORY — PX: BREAST EXCISIONAL BIOPSY: SUR124

## 2017-10-02 HISTORY — DX: Other specified postprocedural states: Z98.890

## 2017-10-02 SURGERY — BREAST LUMPECTOMY WITH RADIOACTIVE SEED LOCALIZATION
Anesthesia: General | Site: Breast | Laterality: Right

## 2017-10-02 MED ORDER — FENTANYL CITRATE (PF) 100 MCG/2ML IJ SOLN
INTRAMUSCULAR | Status: AC
  Filled 2017-10-02: qty 2

## 2017-10-02 MED ORDER — BUPIVACAINE-EPINEPHRINE (PF) 0.25% -1:200000 IJ SOLN
INTRAMUSCULAR | Status: AC
  Filled 2017-10-02: qty 30

## 2017-10-02 MED ORDER — LACTATED RINGERS IV SOLN
INTRAVENOUS | Status: DC
  Administered 2017-10-02 (×2): via INTRAVENOUS

## 2017-10-02 MED ORDER — CHLORHEXIDINE GLUCONATE CLOTH 2 % EX PADS: 6.0000 | MEDICATED_PAD | Freq: Once | CUTANEOUS | Status: DC

## 2017-10-02 MED ORDER — ACETAMINOPHEN 500 MG PO TABS
ORAL_TABLET | ORAL | Status: AC
  Filled 2017-10-02: qty 2

## 2017-10-02 MED ORDER — PROMETHAZINE HCL 25 MG/ML IJ SOLN: 6.2500 mg | INTRAMUSCULAR | Status: DC | PRN

## 2017-10-02 MED ORDER — LIDOCAINE HCL (CARDIAC) 20 MG/ML IV SOLN
INTRAVENOUS | Status: DC | PRN
  Administered 2017-10-02: 60 mg via INTRAVENOUS

## 2017-10-02 MED ORDER — BUPIVACAINE HCL (PF) 0.25 % IJ SOLN
INTRAMUSCULAR | Status: AC
Start: 2017-10-02 — End: 2017-10-02
  Filled 2017-10-02: qty 30

## 2017-10-02 MED ORDER — FENTANYL CITRATE (PF) 100 MCG/2ML IJ SOLN
25.0000 ug | INTRAMUSCULAR | Status: DC | PRN
  Administered 2017-10-02: 25 ug via INTRAVENOUS

## 2017-10-02 MED ORDER — MIDAZOLAM HCL 2 MG/2ML IJ SOLN
INTRAMUSCULAR | Status: AC
  Filled 2017-10-02: qty 2

## 2017-10-02 MED ORDER — PROPOFOL 10 MG/ML IV BOLUS
INTRAVENOUS | Status: DC | PRN
  Administered 2017-10-02: 150 mg via INTRAVENOUS

## 2017-10-02 MED ORDER — BUPIVACAINE-EPINEPHRINE (PF) 0.5% -1:200000 IJ SOLN
INTRAMUSCULAR | Status: DC | PRN
  Administered 2017-10-02: 20 mL

## 2017-10-02 MED ORDER — SCOPOLAMINE 1 MG/3DAYS TD PT72: 1.0000 | MEDICATED_PATCH | Freq: Once | TRANSDERMAL | Status: DC | PRN

## 2017-10-02 MED ORDER — HYDROCODONE-ACETAMINOPHEN 5-325 MG PO TABS: 1.0000 | ORAL_TABLET | Freq: Four times a day (QID) | ORAL | 0 refills | Status: DC | PRN

## 2017-10-02 MED ORDER — MIDAZOLAM HCL 2 MG/2ML IJ SOLN
1.0000 mg | INTRAMUSCULAR | Status: DC | PRN
  Administered 2017-10-02: 2 mg via INTRAVENOUS

## 2017-10-02 MED ORDER — GABAPENTIN 300 MG PO CAPS
300.0000 mg | ORAL_CAPSULE | ORAL | Status: AC
  Administered 2017-10-02: 300 mg via ORAL

## 2017-10-02 MED ORDER — 0.9 % SODIUM CHLORIDE (POUR BTL) OPTIME
TOPICAL | Status: DC | PRN
  Administered 2017-10-02: 200 mL

## 2017-10-02 MED ORDER — GABAPENTIN 300 MG PO CAPS
ORAL_CAPSULE | ORAL | Status: AC
  Filled 2017-10-02: qty 1

## 2017-10-02 MED ORDER — CELECOXIB 200 MG PO CAPS
ORAL_CAPSULE | ORAL | Status: AC
  Filled 2017-10-02: qty 1

## 2017-10-02 MED ORDER — CEFAZOLIN SODIUM-DEXTROSE 2-4 GM/100ML-% IV SOLN
INTRAVENOUS | Status: AC
  Filled 2017-10-02: qty 100

## 2017-10-02 MED ORDER — HYDROCODONE-ACETAMINOPHEN 5-325 MG PO TABS
ORAL_TABLET | ORAL | Status: AC
  Filled 2017-10-02: qty 1

## 2017-10-02 MED ORDER — FENTANYL CITRATE (PF) 100 MCG/2ML IJ SOLN
50.0000 ug | INTRAMUSCULAR | Status: AC | PRN
  Administered 2017-10-02: 50 ug via INTRAVENOUS
  Administered 2017-10-02 (×2): 25 ug via INTRAVENOUS

## 2017-10-02 MED ORDER — CELECOXIB 200 MG PO CAPS
200.0000 mg | ORAL_CAPSULE | ORAL | Status: AC
  Administered 2017-10-02: 200 mg via ORAL

## 2017-10-02 MED ORDER — ACETAMINOPHEN 500 MG PO TABS
1000.0000 mg | ORAL_TABLET | ORAL | Status: AC
  Administered 2017-10-02: 1000 mg via ORAL

## 2017-10-02 MED ORDER — CEFAZOLIN SODIUM-DEXTROSE 2-4 GM/100ML-% IV SOLN
2.0000 g | INTRAVENOUS | Status: AC
  Administered 2017-10-02: 2 g via INTRAVENOUS

## 2017-10-02 MED ORDER — ONDANSETRON HCL 4 MG/2ML IJ SOLN
INTRAMUSCULAR | Status: DC | PRN
  Administered 2017-10-02: 4 mg via INTRAVENOUS

## 2017-10-02 MED ORDER — HYDROCODONE-ACETAMINOPHEN 5-325 MG PO TABS
1.0000 | ORAL_TABLET | Freq: Four times a day (QID) | ORAL | Status: AC | PRN
  Administered 2017-10-02: 1 via ORAL

## 2017-10-02 SURGICAL SUPPLY — 49 items
ADH SKN CLS APL DERMABOND .7 (GAUZE/BANDAGES/DRESSINGS) ×1
APPLIER CLIP 9.375 MED OPEN (MISCELLANEOUS)
APR CLP MED 9.3 20 MLT OPN (MISCELLANEOUS)
BLADE SURG 15 STRL LF DISP TIS (BLADE) ×1 IMPLANT
BLADE SURG 15 STRL SS (BLADE) ×3
CANISTER SUC SOCK COL 7IN (MISCELLANEOUS) ×3 IMPLANT
CANISTER SUCT 1200ML W/VALVE (MISCELLANEOUS) ×3 IMPLANT
CHLORAPREP W/TINT 26ML (MISCELLANEOUS) ×3 IMPLANT
CLIP APPLIE 9.375 MED OPEN (MISCELLANEOUS) IMPLANT
COVER BACK TABLE 60X90IN (DRAPES) ×3 IMPLANT
COVER MAYO STAND STRL (DRAPES) ×3 IMPLANT
COVER PROBE W GEL 5X96 (DRAPES) ×3 IMPLANT
DECANTER SPIKE VIAL GLASS SM (MISCELLANEOUS) IMPLANT
DERMABOND ADVANCED (GAUZE/BANDAGES/DRESSINGS) ×2
DERMABOND ADVANCED .7 DNX12 (GAUZE/BANDAGES/DRESSINGS) ×1 IMPLANT
DEVICE DUBIN W/COMP PLATE 8390 (MISCELLANEOUS) ×3 IMPLANT
DRAPE LAPAROSCOPIC ABDOMINAL (DRAPES) ×3 IMPLANT
DRAPE UTILITY XL STRL (DRAPES) ×3 IMPLANT
ELECT COATED BLADE 2.86 ST (ELECTRODE) ×3 IMPLANT
ELECT REM PT RETURN 9FT ADLT (ELECTROSURGICAL) ×3
ELECTRODE REM PT RTRN 9FT ADLT (ELECTROSURGICAL) ×1 IMPLANT
GLOVE BIO SURGEON STRL SZ7.5 (GLOVE) ×6 IMPLANT
GLOVE BIOGEL PI IND STRL 6.5 (GLOVE) IMPLANT
GLOVE BIOGEL PI IND STRL 7.0 (GLOVE) IMPLANT
GLOVE BIOGEL PI INDICATOR 6.5 (GLOVE) ×2
GLOVE BIOGEL PI INDICATOR 7.0 (GLOVE) ×2
GLOVE SURG SS PI 6.5 STRL IVOR (GLOVE) ×2 IMPLANT
GOWN STRL REUS W/ TWL LRG LVL3 (GOWN DISPOSABLE) ×2 IMPLANT
GOWN STRL REUS W/TWL LRG LVL3 (GOWN DISPOSABLE) ×6
ILLUMINATOR WAVEGUIDE N/F (MISCELLANEOUS) IMPLANT
KIT MARKER MARGIN INK (KITS) ×3 IMPLANT
LIGHT WAVEGUIDE WIDE FLAT (MISCELLANEOUS) IMPLANT
NDL HYPO 25X1 1.5 SAFETY (NEEDLE) IMPLANT
NEEDLE HYPO 25X1 1.5 SAFETY (NEEDLE) IMPLANT
NS IRRIG 1000ML POUR BTL (IV SOLUTION) IMPLANT
PACK BASIN DAY SURGERY FS (CUSTOM PROCEDURE TRAY) ×3 IMPLANT
PENCIL BUTTON HOLSTER BLD 10FT (ELECTRODE) ×3 IMPLANT
SLEEVE SCD COMPRESS KNEE MED (MISCELLANEOUS) ×3 IMPLANT
SPONGE LAP 18X18 X RAY DECT (DISPOSABLE) ×3 IMPLANT
SUT MNCRL AB 4-0 PS2 18 (SUTURE) ×2 IMPLANT
SUT MON AB 4-0 PC3 18 (SUTURE) IMPLANT
SUT SILK 2 0 SH (SUTURE) IMPLANT
SUT VICRYL 3-0 CR8 SH (SUTURE) ×3 IMPLANT
SYR CONTROL 10ML LL (SYRINGE) IMPLANT
TOWEL OR 17X24 6PK STRL BLUE (TOWEL DISPOSABLE) ×3 IMPLANT
TOWEL OR NON WOVEN STRL DISP B (DISPOSABLE) ×3 IMPLANT
TUBE CONNECTING 20'X1/4 (TUBING) ×1
TUBE CONNECTING 20X1/4 (TUBING) ×2 IMPLANT
YANKAUER SUCT BULB TIP NO VENT (SUCTIONS) IMPLANT

## 2017-10-02 NOTE — H&P (Signed)
Tanya Peterson  Location: [facility] Patient #: [id number] DOB: 1961/05/09 Single / Language: Tanya Peterson / Race: White Female   History of Present Illness The patient is a 56 year old female who presents with a breast mass. We are asked to see the patient in consultation by Dr. Jetta Lout to evaluate her for a right breast intraductal papilloma. The patient is a 56 year old white female who recently went for a routine screening mammogram. At that time she was found to have a small area of distortion in the upper right breast that measured about 7 mm by ultrasound. It sounds as though the ultrasound and mammogram were discordant. The area was biopsied and came back as a papilloma. She denies any breast pain or discharge from the nipple. She does have a family history of breast cancer in a paternal aunt and maternal aunt. She does take a baby aspirin every day for what she thought was a stroke last year which apparently did not turn out to be a stroke. She is followed by [facility] for this.   Past Surgical History  Breast Biopsy  Right. Foot Surgery  Left. Oral Surgery   Diagnostic Studies History Colonoscopy  1-5 years ago Mammogram  within last year Pap Smear  1-5 years ago  Allergies  No Known Drug Allergies  Allergies Reconciled   Medication History  Aspirin (81MG  Tablet, Oral) Active. B12-Active (Oral) Specific strength unknown - Active. Magnesium (Oral) Specific strength unknown - Active. Adderall (5MG  Tablet, Oral) Active. Medications Reconciled  Social History Caffeine use  Coffee, Tea. No alcohol use  No drug use  Tobacco use  Never smoker.  Family History  Arthritis  Mother. Melanoma  Father, Mother.  Pregnancy / Birth History  Age at menarche  34 years. Age of menopause  94-50 Gravida  0 Para  0  Other Problems  Anxiety Disorder  Asthma  Depression  Kidney Stone  Migraine Headache     Review of  Systems General Not Present- Appetite Loss, Chills, Fatigue, Fever, Night Sweats, Weight Gain and Weight Loss. Skin Not Present- Change in Wart/Mole, Dryness, Hives, Jaundice, New Lesions, Non-Healing Wounds, Rash and Ulcer. HEENT Present- Seasonal Allergies and Wears glasses/contact lenses. Not Present- Earache, Hearing Loss, Hoarseness, Nose Bleed, Oral Ulcers, Ringing in the Ears, Sinus Pain, Sore Throat, Visual Disturbances and Yellow Eyes. Respiratory Present- Snoring. Not Present- Bloody sputum, Chronic Cough, Difficulty Breathing and Wheezing. Breast Not Present- Breast Mass, Breast Pain, Nipple Discharge and Skin Changes. Cardiovascular Not Present- Chest Pain, Difficulty Breathing Lying Down, Leg Cramps, Palpitations, Rapid Heart Rate, Shortness of Breath and Swelling of Extremities. Gastrointestinal Not Present- Abdominal Pain, Bloating, Bloody Stool, Change in Bowel Habits, Chronic diarrhea, Constipation, Difficulty Swallowing, Excessive gas, Gets full quickly at meals, Hemorrhoids, Indigestion, Nausea, Rectal Pain and Vomiting. Female Genitourinary Not Present- Frequency, Nocturia, Painful Urination, Pelvic Pain and Urgency. Musculoskeletal Not Present- Back Pain, Joint Pain, Joint Stiffness, Muscle Pain, Muscle Weakness and Swelling of Extremities. Neurological Not Present- Decreased Memory, Fainting, Headaches, Numbness, Seizures, Tingling, Tremor, Trouble walking and Weakness. Psychiatric Present- Anxiety. Not Present- Bipolar, Change in Sleep Pattern, Depression, Fearful and Frequent crying. Endocrine Not Present- Cold Intolerance, Excessive Hunger, Hair Changes, Heat Intolerance, Hot flashes and New Diabetes. Hematology Present- Blood Thinners and Easy Bruising. Not Present- Excessive bleeding, Gland problems, HIV and Persistent Infections.  Vitals Weight: 172.2 lb Height: 64in Body Surface Area: 1.84 m Body Mass Index: 29.56 kg/m  Temp.: 97.39F  Pulse: 91 (Regular)   BP: 138/82 (Sitting,  Left Arm, Standard)       Physical Exam General Mental Status-Alert. General Appearance-Consistent with stated age. Hydration-Well hydrated. Voice-Normal.  Head and Neck Head-normocephalic, atraumatic with no lesions or palpable masses. Trachea-midline. Thyroid Gland Characteristics - normal size and consistency.  Eye Eyeball - Bilateral-Extraocular movements intact. Sclera/Conjunctiva - Bilateral-No scleral icterus.  Chest and Lung Exam Chest and lung exam reveals -quiet, even and easy respiratory effort with no use of accessory muscles and on auscultation, normal breath sounds, no adventitious sounds and normal vocal resonance. Inspection Chest Wall - Normal. Back - normal.  Breast Note: There is no palpable mass in either breast. There is no palpable axillary, supraclavicular, or cervical lymphadenopathy.   Cardiovascular Cardiovascular examination reveals -normal heart sounds, regular rate and rhythm with no murmurs and normal pedal pulses bilaterally.  Abdomen Inspection Inspection of the abdomen reveals - No Hernias. Skin - Scar - no surgical scars. Palpation/Percussion Palpation and Percussion of the abdomen reveal - Soft, Non Tender, No Rebound tenderness, No Rigidity (guarding) and No hepatosplenomegaly. Auscultation Auscultation of the abdomen reveals - Bowel sounds normal.  Neurologic Neurologic evaluation reveals -alert and oriented x 3 with no impairment of recent or remote memory. Mental Status-Normal.  Musculoskeletal Normal Exam - Left-Upper Extremity Strength Normal and Lower Extremity Strength Normal. Normal Exam - Right-Upper Extremity Strength Normal and Lower Extremity Strength Normal.  Lymphatic Head & Neck  General Head & Neck Lymphatics: Bilateral - Description - Normal. Axillary  General Axillary Region: Bilateral - Description - Normal. Tenderness - Non Tender. Femoral &  Inguinal  Generalized Femoral & Inguinal Lymphatics: Bilateral - Description - Normal. Tenderness - Non Tender.    Assessment & Plan INTRADUCTAL PAPILLOMA OF BREAST, RIGHT (D24.1) Impression: The patient appears to have an intraductal papilloma of the upper right breast. Because of its abnormal appearance and because of the discordance in the imaging I think it would be reasonable to remove this area. She would also like to have this done. I have discussed with her in detail the risks and benefits of the operation as well as some of the technical aspects and she understands and wishes to proceed. I will plan for a right breast radioactive seed localized lumpectomy Current Plans Pt Education - Breast Diseases: discussed with patient and provided information.

## 2017-10-02 NOTE — Anesthesia Postprocedure Evaluation (Signed)
Anesthesia Post Note  Patient: Tanya Peterson  Procedure(s) Performed: RIGHT BREAST LUMPECTOMY WITH RADIOACTIVE SEED LOCALIZATION ERAS PATHWAY (Right Breast)     Patient location during evaluation: PACU Anesthesia Type: General Level of consciousness: awake and alert Pain management: pain level controlled Vital Signs Assessment: post-procedure vital signs reviewed and stable Respiratory status: spontaneous breathing, nonlabored ventilation, respiratory function stable and patient connected to nasal cannula oxygen Cardiovascular status: blood pressure returned to baseline and stable Postop Assessment: no apparent nausea or vomiting Anesthetic complications: no    Last Vitals:  Vitals:   10/02/17 1015 10/02/17 1045  BP: (!) 115/58 (!) 121/59  Pulse: 94 91  Resp: 19 20  Temp: 36.4 C 36.4 C  SpO2: 97% 99%    Last Pain:  Vitals:   10/02/17 1045  TempSrc:   PainSc: 4                  Tiajuana Amass

## 2017-10-02 NOTE — Anesthesia Procedure Notes (Signed)
Procedure Name: LMA Insertion Date/Time: 10/02/2017 8:42 AM Performed by: Signe Colt, CRNA Pre-anesthesia Checklist: Patient identified, Emergency Drugs available, Suction available and Patient being monitored Patient Re-evaluated:Patient Re-evaluated prior to induction Oxygen Delivery Method: Circle system utilized Preoxygenation: Pre-oxygenation with 100% oxygen Induction Type: IV induction Ventilation: Mask ventilation without difficulty LMA: LMA inserted LMA Size: 4.0 Number of attempts: 1 Airway Equipment and Method: Bite block Placement Confirmation: positive ETCO2 Tube secured with: Tape Dental Injury: Teeth and Oropharynx as per pre-operative assessment

## 2017-10-02 NOTE — Interval H&P Note (Signed)
History and Physical Interval Note:  85/50/1586 8:25 AM  Tanya Peterson  has presented today for surgery, with the diagnosis of right breast papilloma  The various methods of treatment have been discussed with the patient and family. After consideration of risks, benefits and other options for treatment, the patient has consented to  Procedure(s): RIGHT BREAST LUMPECTOMY WITH RADIOACTIVE SEED LOCALIZATION ERAS PATHWAY (Right) as a surgical intervention .  The patient's history has been reviewed, patient examined, no change in status, stable for surgery.  I have reviewed the patient's chart and labs.  Questions were answered to the patient's satisfaction.     TOTH III,PAUL S

## 2017-10-02 NOTE — Transfer of Care (Signed)
Immediate Anesthesia Transfer of Care Note  Patient: Tanya Peterson  Procedure(s) Performed: RIGHT BREAST LUMPECTOMY WITH RADIOACTIVE SEED LOCALIZATION ERAS PATHWAY (Right Breast)  Patient Location: PACU  Anesthesia Type:General  Level of Consciousness: awake and patient cooperative  Airway & Oxygen Therapy: Patient Spontanous Breathing and Patient connected to face mask oxygen  Post-op Assessment: Report given to RN and Post -op Vital signs reviewed and stable  Post vital signs: Reviewed and stable  Last Vitals:  Vitals:   10/02/17 0754  BP: (!) 110/50  Pulse: 78  Resp: 18  Temp: 37.1 C  SpO2: 99%    Last Pain:  Vitals:   10/02/17 0754  TempSrc: Oral         Complications: No apparent anesthesia complications

## 2017-10-02 NOTE — Op Note (Signed)
10/02/2017  6:33 AM  PATIENT:  Tanya Peterson  56 y.o. female  PRE-OPERATIVE DIAGNOSIS:  right breast papilloma  POST-OPERATIVE DIAGNOSIS:  right breast papilloma  PROCEDURE:  Procedure(s): RIGHT BREAST LUMPECTOMY WITH RADIOACTIVE SEED LOCALIZATION ERAS PATHWAY (Right)  SURGEON:  Surgeon(s) and Role:    Jovita Kussmaul, MD - Primary  PHYSICIAN ASSISTANT:   ASSISTANTS: none   ANESTHESIA:   local and general  EBL:  minimal   BLOOD ADMINISTERED:none  DRAINS: none   LOCAL MEDICATIONS USED:  MARCAINE     SPECIMEN:  Source of Specimen:  right breast tissue  DISPOSITION OF SPECIMEN:  PATHOLOGY  COUNTS:  YES  TOURNIQUET:  * No tourniquets in log *  DICTATION: .Dragon Dictation   After informed consent was obtained the patient was brought to the operating room and placed in the supine position on the operating room table.  After adequate induction of general anesthesia the patient's right breast was prepped with ChloraPrep, allowed to dry, and draped in usual sterile manner.  An appropriate timeout was performed.  Previously an I-125 seed was placed in the upper portion of the right breast to mark an area of an intraductal papilloma.  The neoprobe was sent to I-125 in the area of radioactivity was readily identified in the 12 o'clock position of the right breast at the edge of the areola.  The area around this was infiltrated with quarter percent Marcaine.  A curvilinear incision was made with a 15 blade knife along the upper edge of the areola.  The incision was carried through the skin and subcutaneous tissue sharply with the electrocautery.  The dissection was then carried towards the radioactive seed under the direction of the neoprobe.  Once I approached the radioactive seed I then removed a circular portion of breast tissue sharply around the radioactive seed sharply with electrocautery while checking the area of radioactivity frequently with the neoprobe.  Once the specimen was  removed it was oriented with the appropriate paint colors.  A specimen radiograph was obtained that showed the clip and seed to be near the center of the specimen.  Specimen was then sent to pathology for further evaluation.  Hemostasis was achieved using the Bovie electrocautery.  The wound was infiltrated with more quarter percent Marcaine and irrigated with saline.  The deep layer of the wound was then closed with layers of interrupted 3-0 Vicryl stitches.  The skin was then closed with interrupted 4-0 Monocryl subcuticular stitches.  Dermabond dressings were applied.  The patient tolerated the procedure well.  At the end of the case all needle sponge and instrument counts were correct.  The patient was then awakened and taken to recovery in stable condition.  PLAN OF CARE: Discharge to home after PACU  PATIENT DISPOSITION:  PACU - hemodynamically stable.   Delay start of Pharmacological VTE agent (>24hrs) due to surgical blood loss or risk of bleeding: not applicable

## 2017-10-02 NOTE — Discharge Instructions (Signed)
1,000mg  Tylenol given at 8:00am Vicodin given at [place] Instructions  Activity: Get plenty of rest for the remainder of the day. A responsible individual must stay with you for 24 hours following the procedure.  For the next 24 hours, DO NOT: -Drive a car -Paediatric nurse -Drink alcoholic beverages -Take any medication unless instructed by your physician -Make any legal decisions or sign important papers.  Meals: Start with liquid foods such as gelatin or soup. Progress to regular foods as tolerated. Avoid greasy, spicy, heavy foods. If nausea and/or vomiting occur, drink only clear liquids until the nausea and/or vomiting subsides. Call your physician if vomiting continues.  Special Instructions/Symptoms: Your throat may feel dry or sore from the anesthesia or the breathing tube placed in your throat during surgery. If this causes discomfort, gargle with warm salt water. The discomfort should disappear within 24 hours.  If you had a scopolamine patch placed behind your ear for the management of post- operative nausea and/or vomiting:  1. The medication in the patch is effective for 72 hours, after which it should be removed.  Wrap patch in a tissue and discard in the trash. Wash hands thoroughly with soap and water. 2. You may remove the patch earlier than 72 hours if you experience unpleasant side effects which may include dry mouth, dizziness or visual disturbances. 3. Avoid touching the patch. Wash your hands with soap and water after contact with the patch.

## 2017-10-03 ENCOUNTER — Encounter (HOSPITAL_BASED_OUTPATIENT_CLINIC_OR_DEPARTMENT_OTHER): Payer: Self-pay | Admitting: General Surgery

## 2018-02-06 ENCOUNTER — Other Ambulatory Visit: Payer: Managed Care, Other (non HMO)

## 2018-02-11 ENCOUNTER — Other Ambulatory Visit: Payer: Self-pay | Admitting: General Surgery

## 2018-02-11 DIAGNOSIS — N6310 Unspecified lump in the right breast, unspecified quadrant: Secondary | ICD-10-CM

## 2018-02-12 ENCOUNTER — Ambulatory Visit
Admission: RE | Admit: 2018-02-12 | Discharge: 2018-02-12 | Disposition: A | Discharge status: Home / Self Care | Payer: Managed Care, Other (non HMO) | Source: Ambulatory Visit | Attending: Obstetrics and Gynecology | Admitting: Obstetrics and Gynecology

## 2018-02-12 ENCOUNTER — Ambulatory Visit: Admission: RE | Admit: 2018-02-12 | Payer: Managed Care, Other (non HMO) | Source: Ambulatory Visit

## 2018-02-12 DIAGNOSIS — N6310 Unspecified lump in the right breast, unspecified quadrant: Secondary | ICD-10-CM

## 2018-06-16 NOTE — Progress Notes (Signed)
HPI: FU palpitations. Seen in the emergency room June 2017 with palpitations. ECG with sinus tachycardia and nonspecific ST changes. 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 Echocardiogram August 2017 showed normal LV systolic function. Exercise treadmill May 2018 was normal.  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  Since last seen  she had an episode of palpitations in May that she feels was anxiety.  No strips available.  She has mild dyspnea on exertion but no orthopnea, PND, pedal edema, chest pain or syncope.  Current Outpatient Medications  Medication Sig Dispense Refill  . acetaminophen (TYLENOL) 325 MG tablet Take 650 mg by mouth every 6 (six) hours as needed.    Marland Kitchen albuterol (PROVENTIL HFA;VENTOLIN HFA) 108 (90 BASE) MCG/ACT inhaler Inhale 1 puff into the lungs every 30 (thirty) minutes as needed for wheezing or shortness of breath.    . Ascorbic Acid (VITAMIN C) 1000 MG tablet Take 1,000 mg by mouth daily.    Marland Kitchen aspirin EC 81 MG tablet Take 1 tablet (81 mg total) by mouth daily. 30 tablet 1  . Bioflavonoid Products (GRAPE SEED PO) Take by mouth. Grape seed with resveratrol    . Cholecalciferol (VITAMIN D3) 1000 units CAPS Take by mouth daily.    . Estradiol (VAGIFEM VA) Place vaginally as directed.    . Ferrous Sulfate (IRON) 325 (65 Fe) MG TABS Take by mouth daily.    Marland Kitchen LORazepam (ATIVAN) 1 MG tablet Take 1 tablet (1 mg total) by mouth 3 (three) times daily as needed for anxiety. (Patient taking differently: Take 0.5 mg by mouth 3 (three) times daily as needed for anxiety. ) 15 tablet 0  . magnesium oxide (MAG-OX) 400 MG tablet Take 400 mg by mouth daily.    . NON FORMULARY C-Medicare scar cream--Use as directed    . triamcinolone (KENALOG) 0.025 % ointment Apply 1 application topically 2 (two) times daily. 30 g 0  . vitamin B-12 (CYANOCOBALAMIN) 1000 MCG  tablet Take 1,000 mcg by mouth daily.     No current facility-administered medications for this visit.      Past Medical History:  Diagnosis Date  . ADHD (attention deficit hyperactivity disorder)   . Asthma    rarely uses inhaler  . Bipolar affective disorder ([place])   . Complication of anesthesia   . Depression   . 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 History of kidney stones   . Panic attacks   . PONV (postoperative nausea and vomiting)   . Sleep disorder    tx with nuvigal    Past Surgical History:  Procedure Laterality Date  . BREAST EXCISIONAL BIOPSY Right 10/02/2017   papilloma  . BREAST LUMPECTOMY WITH RADIOACTIVE SEED LOCALIZATION Right 10/02/2017   Procedure: RIGHT BREAST LUMPECTOMY WITH RADIOACTIVE SEED LOCALIZATION ERAS PATHWAY;  Surgeon: Jovita Kussmaul, MD;  Location: [facility];  Service: General;  Laterality: Right;  . COLONOSCOPY WITH PROPOFOL N/A 01/11/2015   Procedure: COLONOSCOPY WITH PROPOFOL;  Surgeon: Garlan Fair, MD;  Location: [facility];  Service: Endoscopy;  Laterality: N/A;  . HYSTEROSCOPY W/D&C N/A 03/05/2016   Procedure: DILATATION AND CURETTAGE /HYSTEROSCOPY;  Surgeon: Vanessa Kick, MD;  Location: [facility];  Service: Gynecology;  Laterality: N/A;  . kdiney stone surgery  12/2015  . LIPOSUCTION    . TENDON REPAIR Left 06/20/2016   foot  . WISDOM TOOTH EXTRACTION  Social History   Socioeconomic History  . Marital status: Single    Spouse name: Not on file  . Number of children: Not on file  . Years of education: Not on file  . Highest education level: Not on file  Occupational History  . Occupation: Freight forwarder at [employer]  . Financial resource strain: Not on file  . Food insecurity:    Worry: Not on file    Inability: Not on file  . Transportation needs:    Medical: Not on file    Non-medical: Not on file  Tobacco Use  . Smoking status: Never Smoker  . Smokeless tobacco: Never Used    Substance and Sexual Activity  . Alcohol use: No  . Drug use: No  . Sexual activity: Yes    Birth control/protection: Post-menopausal  Lifestyle  . Physical activity:    Days per week: Not on file    Minutes per session: Not on file  . Stress: Not on file  Relationships  . Social connections:    Talks on phone: Not on file    Gets together: Not on file    Attends religious service: Not on file    Active member of club or organization: Not on file    Attends meetings of clubs or organizations: Not on file    Relationship status: Not on file  . Intimate partner violence:    Fear of current or ex partner: Not on file    Emotionally abused: Not on file    Physically abused: Not on file    Forced sexual activity: Not on file  Other Topics Concern  . Not on file  Social History Narrative  . Not on file    Family History  Problem Relation Age of Onset  . Alzheimer's disease Mother   . Alzheimer's disease Father   . Breast cancer Paternal Aunt     ROS: no fevers or chills, productive cough, hemoptysis, dysphasia, odynophagia, melena, hematochezia, dysuria, hematuria, rash, seizure activity, orthopnea, PND, pedal edema, claudication. Remaining systems are negative.  Physical Exam: Well-developed well-nourished in no acute distress.  Skin is warm and dry.  HEENT is normal.  Neck is supple.  Chest is clear to auscultation with normal expansion.  Cardiovascular exam is regular rate and rhythm.  Abdominal exam nontender or distended. No masses palpated. Extremities show no edema. neuro grossly intact  ECG-normal sinus rhythm at a rate of 73.  No ST changes.  Personally reviewed  A/P  1 palpitations-symptoms are reasonably well controlled.  If they worsen we can consider the addition of a beta-blocker in the future.  2 chest pain-previous symptoms atypical.  Exercise treadmill negative.  No further evaluation.  3 depression/anxiety-managed by primary care.  Kirk Ruths, MD

## 2018-06-18 ENCOUNTER — Ambulatory Visit: Payer: Managed Care, Other (non HMO) | Admitting: Cardiology

## 2018-06-18 ENCOUNTER — Encounter: Payer: Self-pay | Admitting: Cardiology

## 2018-06-18 VITALS — BP 126/82 | HR 73 | Ht 64.0 in | Wt 177.8 lb

## 2018-06-18 DIAGNOSIS — R072 Precordial pain: Secondary | ICD-10-CM

## 2018-06-18 DIAGNOSIS — R002 Palpitations: Secondary | ICD-10-CM

## 2018-06-18 NOTE — Patient Instructions (Signed)
Your physician wants you to follow-up in: ONE YEAR WITH DR CRENSHAW You will receive a reminder letter in the mail two months in advance. If you don't receive a letter, please call our office to schedule the follow-up appointment.   If you need a refill on your cardiac medications before your next appointment, please call your pharmacy.  

## 2018-09-23 ENCOUNTER — Other Ambulatory Visit: Payer: Self-pay

## 2018-09-23 ENCOUNTER — Emergency Department (HOSPITAL_BASED_OUTPATIENT_CLINIC_OR_DEPARTMENT_OTHER): Payer: 59

## 2018-09-23 ENCOUNTER — Encounter (HOSPITAL_BASED_OUTPATIENT_CLINIC_OR_DEPARTMENT_OTHER): Payer: Self-pay | Admitting: *Deleted

## 2018-09-23 ENCOUNTER — Emergency Department (HOSPITAL_BASED_OUTPATIENT_CLINIC_OR_DEPARTMENT_OTHER)
Admission: EM | Admit: 2018-09-23 | Discharge: 2018-09-23 | Disposition: A | Discharge status: Home / Self Care | Payer: 59 | Attending: Emergency Medicine | Admitting: Emergency Medicine

## 2018-09-23 DIAGNOSIS — Y9389 Activity, other specified: Secondary | ICD-10-CM | POA: Diagnosis not present

## 2018-09-23 DIAGNOSIS — S0990XA Unspecified injury of head, initial encounter: Secondary | ICD-10-CM | POA: Diagnosis present

## 2018-09-23 DIAGNOSIS — Y999 Unspecified external cause status: Secondary | ICD-10-CM | POA: Insufficient documentation

## 2018-09-23 DIAGNOSIS — Y92410 Unspecified street and highway as the place of occurrence of the external cause: Secondary | ICD-10-CM | POA: Insufficient documentation

## 2018-09-23 DIAGNOSIS — S161XXA Strain of muscle, fascia and tendon at neck level, initial encounter: Secondary | ICD-10-CM

## 2018-09-23 DIAGNOSIS — J45909 Unspecified asthma, uncomplicated: Secondary | ICD-10-CM | POA: Insufficient documentation

## 2018-09-23 DIAGNOSIS — Z79899 Other long term (current) drug therapy: Secondary | ICD-10-CM | POA: Insufficient documentation

## 2018-09-23 DIAGNOSIS — R51 Headache: Secondary | ICD-10-CM | POA: Insufficient documentation

## 2018-09-23 DIAGNOSIS — S39012A Strain of muscle, fascia and tendon of lower back, initial encounter: Secondary | ICD-10-CM | POA: Insufficient documentation
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
## 2019-01-20 ENCOUNTER — Ambulatory Visit: Payer: 59 | Admitting: Podiatry

## 2019-01-20 ENCOUNTER — Other Ambulatory Visit: Payer: Self-pay | Admitting: Podiatry

## 2019-01-20 ENCOUNTER — Ambulatory Visit (INDEPENDENT_AMBULATORY_CARE_PROVIDER_SITE_OTHER): Payer: 59

## 2019-01-20 DIAGNOSIS — B070 Plantar wart: Secondary | ICD-10-CM | POA: Diagnosis not present

## 2019-01-20 DIAGNOSIS — S90852D Superficial foreign body, left foot, subsequent encounter: Secondary | ICD-10-CM

## 2019-01-20 DIAGNOSIS — M795 Residual foreign body in soft tissue: Secondary | ICD-10-CM

## 2019-01-20 DIAGNOSIS — M79671 Pain in right foot: Secondary | ICD-10-CM

## 2019-01-20 DIAGNOSIS — Q828 Other specified congenital malformations of skin: Secondary | ICD-10-CM

## 2019-01-20 DIAGNOSIS — M79672 Pain in left foot: Secondary | ICD-10-CM

## 2019-01-20 DIAGNOSIS — N762 Acute vulvitis: Secondary | ICD-10-CM | POA: Insufficient documentation

## 2019-01-20 NOTE — Patient Instructions (Signed)
Keep the bandage on for 24 hours. At that time, remove and clean with soap and water. If it hurts or burns before 24 hours go ahead and remove the bandage and wash with soap and water. Keep the area clean. If there is any blistering cover with antibiotic ointment and a bandage. Monitor for any redness, drainage, or other signs of infection. Call the office if any are to occur. If you have any questions, please call the office at [phone].  

## 2019-01-27 NOTE — Progress Notes (Signed)
Subjective: Tanya Peterson presents the office today for concerns of pain to her left plantar forefoot, submetatarsal 4 area.  She is noticed a painful spot.  Over the last week which is become worse with pressure and weightbearing.  She denies any recent injury or trauma denies stepping on any foreign objects.  She states that when she walks she can feel a "lump" in the area.  No recent treatment. Denies any systemic complaints such as fevers, chills, nausea, vomiting. No acute changes since last appointment, and no other complaints at this time.   She said her ankles been doing great.  Objective: AAO x3, NAD DP/PT pulses palpable bilaterally, CRT less than 3 seconds On the left foot submetatarsal 4 is a hyperkeratotic lesion.  Upon debridement there is a deep punctate lesion but there is no evidence of verruca or foreign body. Edema, erythema, drainage or pus or any clinical signs of infection. No open lesions or pre-ulcerative lesions.  No pain with calf compression, swelling, warmth, erythema  Assessment: Left foot porokeratosis  Plan: -All treatment options discussed with the patient including all alternatives, risks, complications.  -X-rays were obtained reviewed.  No evidence of foreign body or fracture.  No calcifications. -Lesion was sharply debrided with a #312 with scalpel without any complications or bleeding.  The areas cleaned with alcohol and I was placed followed salicylic acid and a bandage.  Post procedure instructions were discussed.  Monitor for any signs or symptoms of infection. -Patient encouraged to call the office with any questions, concerns, change in symptoms.   Trula Slade DPM

## 2019-02-03 ENCOUNTER — Ambulatory Visit (INDEPENDENT_AMBULATORY_CARE_PROVIDER_SITE_OTHER): Payer: 59 | Admitting: Podiatry

## 2019-02-03 ENCOUNTER — Other Ambulatory Visit: Payer: Self-pay

## 2019-02-03 DIAGNOSIS — M79671 Pain in right foot: Secondary | ICD-10-CM

## 2019-02-03 DIAGNOSIS — B070 Plantar wart: Secondary | ICD-10-CM | POA: Diagnosis not present

## 2019-02-03 DIAGNOSIS — Q828 Other specified congenital malformations of skin: Secondary | ICD-10-CM

## 2019-02-09 NOTE — Progress Notes (Signed)
Subjective: 58 year old female presents the office today for evaluation of porokeratosis, wart on left foot. She states that overall she is doing better.  She no complications after the treatment last appointment.  Denies any redness or drainage or any swelling.  At this time there is no significant tenderness. Denies any systemic complaints such as fevers, chills, nausea, vomiting. No acute changes since last appointment, and no other complaints at this time.   Objective: AAO x3, NAD DP/PT pulses palpable bilaterally, CRT less than 3 seconds On the left foot submetatarsal 4 is a hyperkeratotic lesion.  Upon debridement there is still some residual hyperkeratotic tissue but upon debridement there is no underlying ulceration, drainage or verruca or foreign body.  No tenderness palpation today. No open lesions or pre-ulcerative lesions.  No pain with calf compression, swelling, warmth, erythema  Assessment: Residual porokeratosis, skin lesion left foot  Plan: -All treatment options discussed with the patient including all alternatives, risks, complications.  -I debrided the hyperkeratotic lesion to the any complications or bleeding.  Areas cleaned with alcohol and a pad was placed followed by a small amount of salicylic acid.  Post procedure instructions were discussed.  Monitor for any signs or symptoms of infection. -Patient encouraged to call the office with any questions, concerns, change in symptoms.   Trula Slade DPM

## 2019-05-12 ENCOUNTER — Telehealth: Payer: Self-pay | Admitting: *Deleted

## 2019-05-12 NOTE — Telephone Encounter (Signed)
A message was left, re: follow up visit. 

## 2019-09-29 NOTE — Progress Notes (Signed)
HPI: FUpalpitations. Seen in the emergency room June 2017 with palpitations. ECG with sinus tachycardia and nonspecific ST changes. 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 Echocardiogram August 2017 showed normal LV systolic function. Exercise treadmill May 2018 was normal.  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  Since last seen  she denies dyspnea, chest pain or syncope.  Occasional brief flutters but no sustained palpitations.  Current Outpatient Medications  Medication Sig Dispense Refill  . acetaminophen (TYLENOL) 325 MG tablet Take 650 mg by mouth every 6 (six) hours as needed.    Marland Kitchen albuterol (PROVENTIL HFA;VENTOLIN HFA) 108 (90 BASE) MCG/ACT inhaler Inhale 1 puff into the lungs every 30 (thirty) minutes as needed for wheezing or shortness of breath.    . Ascorbic Acid (VITAMIN C) 1000 MG tablet Take 1,000 mg by mouth daily.    Marland Kitchen aspirin EC 81 MG tablet Take 1 tablet (81 mg total) by mouth daily. 30 tablet 1  . Bioflavonoid Products (GRAPE SEED PO) Take by mouth. Grape seed with resveratrol    . CALCIUM PO Take by mouth as directed.    . Cholecalciferol (VITAMIN D3) 1000 units CAPS Take by mouth daily.    . Estradiol (VAGIFEM VA) Place vaginally as directed.    Marland Kitchen KRILL OIL PO Take by mouth as directed.    Marland Kitchen LORazepam (ATIVAN) 1 MG tablet Take 1 tablet (1 mg total) by mouth 3 (three) times daily as needed for anxiety. (Patient taking differently: Take 0.5 mg by mouth 3 (three) times daily as needed for anxiety. ) 15 tablet 0  . magnesium oxide (MAG-OX) 400 MG tablet Take 400 mg by mouth daily.    Marland Kitchen triamcinolone (KENALOG) 0.025 % ointment Apply 1 application topically 2 (two) times daily. 30 g 0  . vitamin B-12 (CYANOCOBALAMIN) 1000 MCG tablet Take 1,000 mcg by mouth daily.    . Ferrous Sulfate (IRON) 325 (65 Fe) MG TABS Take by mouth daily.    . NON FORMULARY  C-Medicare scar cream--Use as directed     No current facility-administered medications for this visit.      Past Medical History:  Diagnosis Date  . ADHD (attention deficit hyperactivity disorder)   . Asthma    rarely uses inhaler  . Bipolar affective disorder ([place])   . Complication of anesthesia   . Depression   . 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 History of kidney stones   . Panic attacks   . PONV (postoperative nausea and vomiting)   . Sleep disorder    tx with nuvigal    Past Surgical History:  Procedure Laterality Date  . BREAST EXCISIONAL BIOPSY Right 10/02/2017   papilloma  . BREAST LUMPECTOMY WITH RADIOACTIVE SEED LOCALIZATION Right 10/02/2017   Procedure: RIGHT BREAST LUMPECTOMY WITH RADIOACTIVE SEED LOCALIZATION ERAS PATHWAY;  Surgeon: Jovita Kussmaul, MD;  Location: [facility];  Service: General;  Laterality: Right;  . COLONOSCOPY WITH PROPOFOL N/A 01/11/2015   Procedure: COLONOSCOPY WITH PROPOFOL;  Surgeon: Garlan Fair, MD;  Location: [facility];  Service: Endoscopy;  Laterality: N/A;  . HYSTEROSCOPY W/D&C N/A 03/05/2016   Procedure: DILATATION AND CURETTAGE /HYSTEROSCOPY;  Surgeon: Vanessa Kick, MD;  Location: [facility];  Service: Gynecology;  Laterality: N/A;  . kdiney stone surgery  12/2015  . LIPOSUCTION    . TENDON REPAIR Left 06/20/2016   foot  . WISDOM  TOOTH EXTRACTION      Social History   Socioeconomic History  . Marital status: Single    Spouse name: Not on file  . Number of children: Not on file  . Years of education: Not on file  . Highest education level: Not on file  Occupational History  . Occupation: Freight forwarder at [employer]  . Financial resource strain: Not on file  . Food insecurity    Worry: Not on file    Inability: Not on file  . Transportation needs    Medical: Not on file    Non-medical: Not on file  Tobacco Use  . Smoking status: Never Smoker  . Smokeless tobacco: Never Used   Substance and Sexual Activity  . Alcohol use: No  . Drug use: No  . Sexual activity: Yes    Birth control/protection: Post-menopausal  Lifestyle  . Physical activity    Days per week: Not on file    Minutes per session: Not on file  . Stress: Not on file  Relationships  . Social Herbalist on phone: Not on file    Gets together: Not on file    Attends religious service: Not on file    Active member of club or organization: Not on file    Attends meetings of clubs or organizations: Not on file    Relationship status: Not on file  . Intimate partner violence    Fear of current or ex partner: Not on file    Emotionally abused: Not on file    Physically abused: Not on file    Forced sexual activity: Not on file  Other Topics Concern  . Not on file  Social History Narrative  . Not on file    Family History  Problem Relation Age of Onset  . Alzheimer's disease Mother   . Alzheimer's disease Father   . Breast cancer Paternal Aunt     ROS: Problems with anxiety but no fevers or chills, productive cough, hemoptysis, dysphasia, odynophagia, melena, hematochezia, dysuria, hematuria, rash, seizure activity, orthopnea, PND, pedal edema, claudication. Remaining systems are negative.  Physical Exam: Well-developed well-nourished in no acute distress.  Skin is warm and dry.  HEENT is normal.  Neck is supple.  Chest is clear to auscultation with normal expansion.  Cardiovascular exam is regular rate and rhythm.  Abdominal exam nontender or distended. No masses palpated. Extremities show no edema. neuro grossly intact  ECG-sinus rhythm at a rate of 68, no ST changes.  Personally reviewed  A/P  1 palpitations-symptoms are controlled.  We will consider a beta-blocker in the future if her symptoms worsen.  2 chest pain-previous treadmill negative.  No recurrences.  No plans for further evaluation.  3 history of depression/anxiety-followed by primary care.  4  fibromuscular dysplasia-followed by vascular surgery.  Kirk Ruths, MD

## 2019-10-07 ENCOUNTER — Other Ambulatory Visit: Payer: Self-pay

## 2019-10-07 ENCOUNTER — Ambulatory Visit (INDEPENDENT_AMBULATORY_CARE_PROVIDER_SITE_OTHER): Payer: Managed Care, Other (non HMO) | Admitting: Cardiology

## 2019-10-07 ENCOUNTER — Encounter: Payer: Self-pay | Admitting: Cardiology

## 2019-10-07 VITALS — BP 118/77 | HR 68 | Ht 64.0 in | Wt 155.0 lb

## 2019-10-07 DIAGNOSIS — I773 Arterial fibromuscular dysplasia: Secondary | ICD-10-CM | POA: Diagnosis not present

## 2019-10-07 DIAGNOSIS — R072 Precordial pain: Secondary | ICD-10-CM | POA: Diagnosis not present

## 2019-10-07 DIAGNOSIS — R002 Palpitations: Secondary | ICD-10-CM | POA: Diagnosis not present

## 2019-10-07 NOTE — Patient Instructions (Signed)
Medication Instructions:  NO CHANGE *If you need a refill on your cardiac medications before your next appointment, please call your pharmacy*  Lab Work: If you have labs (blood work) drawn today and your tests are completely normal, you will receive your results only by: . MyChart Message (if you have MyChart) OR . A paper copy in the mail If you have any lab test that is abnormal or we need to change your treatment, we will call you to review the results.  Follow-Up: At [facility], you and your health needs are our priority.  As part of our continuing mission to provide you with exceptional heart care, we have created designated Provider Care Teams.  These Care Teams include your primary Cardiologist (physician) and Advanced Practice Providers (APPs -  Physician Assistants and Nurse Practitioners) who all work together to provide you with the care you need, when you need it.  Your next appointment:   12 months  The format for your next appointment:   In Person  Provider:   Brian Crenshaw, MD   

## 2019-11-23 ENCOUNTER — Ambulatory Visit (INDEPENDENT_AMBULATORY_CARE_PROVIDER_SITE_OTHER): Payer: Managed Care, Other (non HMO)

## 2019-11-23 ENCOUNTER — Ambulatory Visit (INDEPENDENT_AMBULATORY_CARE_PROVIDER_SITE_OTHER): Payer: Managed Care, Other (non HMO) | Admitting: Podiatry

## 2019-11-23 ENCOUNTER — Other Ambulatory Visit: Payer: Self-pay

## 2019-11-23 ENCOUNTER — Encounter: Payer: Self-pay | Admitting: Podiatry

## 2019-11-23 DIAGNOSIS — M79671 Pain in right foot: Secondary | ICD-10-CM

## 2019-11-23 DIAGNOSIS — T148XXA Other injury of unspecified body region, initial encounter: Secondary | ICD-10-CM

## 2019-11-23 DIAGNOSIS — R609 Edema, unspecified: Secondary | ICD-10-CM

## 2019-11-23 DIAGNOSIS — S99921A Unspecified injury of right foot, initial encounter: Secondary | ICD-10-CM

## 2019-11-23 DIAGNOSIS — S99911A Unspecified injury of right ankle, initial encounter: Secondary | ICD-10-CM

## 2019-11-24 ENCOUNTER — Other Ambulatory Visit: Payer: Self-pay | Admitting: Podiatry

## 2019-11-24 DIAGNOSIS — S99921A Unspecified injury of right foot, initial encounter: Secondary | ICD-10-CM

## 2019-11-24 DIAGNOSIS — S99911A Unspecified injury of right ankle, initial encounter: Secondary | ICD-10-CM

## 2019-11-30 ENCOUNTER — Telehealth: Payer: Self-pay | Admitting: *Deleted

## 2019-11-30 ENCOUNTER — Encounter: Payer: Self-pay | Admitting: Podiatry

## 2019-11-30 ENCOUNTER — Ambulatory Visit: Payer: Managed Care, Other (non HMO) | Admitting: Podiatry

## 2019-11-30 ENCOUNTER — Other Ambulatory Visit: Payer: Self-pay

## 2019-11-30 DIAGNOSIS — T148XXA Other injury of unspecified body region, initial encounter: Secondary | ICD-10-CM | POA: Diagnosis not present

## 2019-11-30 DIAGNOSIS — R609 Edema, unspecified: Secondary | ICD-10-CM

## 2019-11-30 DIAGNOSIS — S99921A Unspecified injury of right foot, initial encounter: Secondary | ICD-10-CM

## 2019-11-30 DIAGNOSIS — S99921D Unspecified injury of right foot, subsequent encounter: Secondary | ICD-10-CM

## 2019-11-30 MED ORDER — IBUPROFEN 600 MG PO TABS: 600.0000 mg | ORAL_TABLET | Freq: Three times a day (TID) | ORAL | 0 refills | Status: DC | PRN

## 2019-11-30 NOTE — Telephone Encounter (Signed)
-----   Message from Trula Slade, DPM sent at 11/30/2019 10:18 AM EST ----- Can you please order an MRI of the right foot to rule out tendon tear? She had a fall and twisted her ankle and conitnued pain despite immobilization. Weakness with inversion.   Can we do either the [place] or [place] med center so it will be faster? Thanks.

## 2019-11-30 NOTE — Patient Instructions (Signed)
I have ordered an MRI of the right foot. If you do not hear for them about scheduling within the next 1 week, or you have any questions please give [place] a call at [phone].

## 2019-11-30 NOTE — Progress Notes (Signed)
Subjective: 59 year old female presents the office today for concerns of her right foot, ankle injury which occurred on November 20, 2019.  She states that she was walking and there was a utility in ground plate that was not bolted to the ground and she stepped on it and fell through twisting her foot and ankle.  Since then she had sudden onset pain as well as swelling and bruising to the foot and ankle.  She is not able to put weight on the foot.  She presents today using crutches.  Pain level 7/10 and she is taking Tylenol for this as well as using ice.  Other treatment. Denies any systemic complaints such as fevers, chills, nausea, vomiting. No acute changes since last appointment, and no other complaints at this time.   Objective: AAO x3, NAD DP/PT pulses palpable bilaterally, CRT less than 3 seconds The majority of tenderness is on the medial aspect of foot along the navicular tuberosity and mildly on the course of the posterior tibial, flexor tendons.  There is moderate swelling to the foot and ankle as well as bruising present.  There is no area of pinpoint tenderness that I can identify today. No open lesions or pre-ulcerative lesions.  No pain with calf compression, swelling, warmth, erythema  Assessment: Right foot injury, concern for posterior tibial tendon tear  Plan: -All treatment options discussed with the patient including all alternatives, risks, complications.  -Given her pain I do recommend continue mobilization in the cam boot.  In the boot was applied and precautions were advised when to remove this.  Continue ice elevate.  Discussed MRI.  I will see her back next week and at that point we will further evaluate.  Difficult to fully evaluate the function of the tendon given the swelling as well as pain and she is guarding.  Note was provided to remain out of work. -Patient encouraged to call the office with any questions, concerns, change in symptoms.   Trula Slade DPM

## 2019-12-01 NOTE — Telephone Encounter (Signed)
Orders to L. Cox, CMA for pre-cert, faxed to [employer].

## 2019-12-04 ENCOUNTER — Telehealth: Payer: Self-pay | Admitting: *Deleted

## 2019-12-04 NOTE — Telephone Encounter (Signed)
Called and spoke with Gerarda Gunther from [place] and the representative stated that there was no prior authorization required and the reference number is [id number] and is based on medical necessity. Tanya Peterson

## 2019-12-04 NOTE — Progress Notes (Signed)
Subjective: 59 year old female presents the office today for follow-up evaluation of right foot injury, medial foot pain.  She states that she still has quite a bit of discomfort she is not able to put full weight on her foot even with the cam boot.  Swelling has come down and she has been taking over-the-counter anti-inflammatories as needed.  Bruising still remains but improved. Denies any systemic complaints such as fevers, chills, nausea, vomiting. No acute changes since last appointment, and no other complaints at this time.   Objective: AAO x3, NAD DP/PT pulses palpable bilaterally, CRT less than 3 seconds Majority of discomfort still on the distal portion of the surgical tendon as well as flexor tendons on medial aspect ankle along the navicular tuberosity as well as medial cuneiform.  She does have decreased strength and inversion but difficult to see if this is due to tendon tear versus guarding.  Swelling has improved is still ecchymosis present. No open lesions or pre-ulcerative lesions.  No pain with calf compression, swelling, warmth, erythema  Assessment: Concern for tendon tear right foot  Plan: -All treatment options discussed with the patient including all alternatives, risks, complications.  -At this point she is unable to put weight on her foot and she has decreased range of motion and would order an MRI to rule out tendon tear as well as other pathology not able to appreciate on x-ray.  For now continue with the cam boot and nonweightbearing.  Ice elevation.  Prescribed ibuprofen. -Patient encouraged to call the office with any questions, concerns, change in symptoms.   Follow up after MRI or sooner if needed  Trula Slade DPM

## 2019-12-05 ENCOUNTER — Other Ambulatory Visit: Payer: Self-pay

## 2019-12-05 ENCOUNTER — Ambulatory Visit (HOSPITAL_BASED_OUTPATIENT_CLINIC_OR_DEPARTMENT_OTHER)
Admission: RE | Admit: 2019-12-05 | Discharge: 2019-12-05 | Disposition: A | Discharge status: Home / Self Care | Payer: Managed Care, Other (non HMO) | Source: Ambulatory Visit | Attending: Podiatry | Admitting: Podiatry

## 2019-12-05 DIAGNOSIS — R609 Edema, unspecified: Secondary | ICD-10-CM | POA: Insufficient documentation

## 2019-12-05 DIAGNOSIS — T148XXA Other injury of unspecified body region, initial encounter: Secondary | ICD-10-CM | POA: Diagnosis present

## 2019-12-10 ENCOUNTER — Other Ambulatory Visit: Payer: Self-pay

## 2019-12-10 ENCOUNTER — Ambulatory Visit: Payer: Managed Care, Other (non HMO) | Admitting: Podiatry

## 2019-12-10 DIAGNOSIS — T148XXA Other injury of unspecified body region, initial encounter: Secondary | ICD-10-CM

## 2019-12-10 NOTE — Patient Instructions (Signed)
Pre-Operative Instructions  Congratulations, you have decided to take an important step towards improving your quality of life.  You can be assured that the doctors and staff at [facility] will be with you every step of the way.  Here are some important things you should know:  1. Plan to be at the surgery center/hospital at least 1 (one) hour prior to your scheduled time, unless otherwise directed by the surgical center/hospital staff.  You must have a responsible adult accompany you, remain during the surgery and drive you home.  Make sure you have directions to the surgical center/hospital to ensure you arrive on time. 2. If you are having surgery at [facility] or [facility], you will need a copy of your medical history and physical form from your family physician within one month prior to the date of surgery. We will give you a form for your primary physician to complete.  3. We make every effort to accommodate the date you request for surgery.  However, there are times where surgery dates or times have to be moved.  We will contact you as soon as possible if a change in schedule is required.   4. No aspirin/ibuprofen for one week before surgery.  If you are on aspirin, any non-steroidal anti-inflammatory medications (Mobic, Aleve, Ibuprofen) should not be taken seven (7) days prior to your surgery.  You make take Tylenol for pain prior to surgery.  5. Medications - If you are taking daily heart and blood pressure medications, seizure, reflux, allergy, asthma, anxiety, pain or diabetes medications, make sure you notify the surgery center/hospital before the day of surgery so they can tell you which medications you should take or avoid the day of surgery. 6. No food or drink after midnight the night before surgery unless directed otherwise by surgical center/hospital staff. 7. No alcoholic beverages 24-hours prior to surgery.  No smoking 24-hours prior or 24-hours after  surgery. 8. Wear loose pants or shorts. They should be loose enough to fit over bandages, boots, and casts. 9. Don't wear slip-on shoes. Sneakers are preferred. 10. Bring your boot with you to the surgery center/hospital.  Also bring crutches or a walker if your physician has prescribed it for you.  If you do not have this equipment, it will be provided for you after surgery. 11. If you have not been contacted by the surgery center/hospital by the day before your surgery, call to confirm the date and time of your surgery. 12. Leave-time from work may vary depending on the type of surgery you have.  Appropriate arrangements should be made prior to surgery with your employer. 13. Prescriptions will be provided immediately following surgery by your doctor.  Fill these as soon as possible after surgery and take the medication as directed. Pain medications will not be refilled on weekends and must be approved by the doctor. 14. Remove nail polish on the operative foot and avoid getting pedicures prior to surgery. 15. Wash the night before surgery.  The night before surgery wash the foot and leg well with water and the antibacterial soap provided. Be sure to pay special attention to beneath the toenails and in between the toes.  Wash for at least three (3) minutes. Rinse thoroughly with water and dry well with a towel.  Perform this wash unless told not to do so by your physician.  Enclosed: 1 Ice pack (please put in freezer the night before surgery)   1 Hibiclens skin cleaner     Pre-op instructions  If you have any questions regarding the instructions, please do not hesitate to call our office.  [place]: [address], [place], [place] [address] -- [phone]  [place]: [address] [place], [place] [address] -- [phone]  [place]: [address], [place], [place] [address] -- [phone]   Website: [email or web address] 

## 2019-12-15 ENCOUNTER — Telehealth: Payer: Self-pay | Admitting: *Deleted

## 2019-12-15 NOTE — Telephone Encounter (Signed)
I am calling you in regards to your surgery date that was given to you of December 30, 2019.  The assistant was not aware that Dr. Leigh Aurora schedule was already full for that date. Can we schedule it for 01/06/2020.  "He pulled it up in the room and looked at it while I was there."  There was two surgeries that had not been entered at the time that he looked.  "Well okay, I guess that will have to do."  My apologies, I am sorry about that.  "What time am I supposed to be there?"  I cannot give you an arrival time.  You will receive a call from someone from the surgical center a day or two prior to your surgery date and that person will give you your arrival time.  The reason we don't give the times is because they may have cancellations and have to move people around or they may have patients that are Diabetic or children that they like to do first.

## 2019-12-16 NOTE — Telephone Encounter (Signed)
Called pt back and told her we could move her sx to 01/13/20. Pt stated to put her down and asked to be put on a cancellation list if anyone cancels sx on 12/30/19.

## 2019-12-16 NOTE — Telephone Encounter (Signed)
I had a message from you yesterday about moving my sx from 02/10 to 02/17 and I have no one available on 02/17. I was hoping we could go back to 02/10 or go to 02/24. If you can give me a call back, I would appreciate it. Thank you so much.

## 2019-12-21 NOTE — Progress Notes (Signed)
Subjective: 59 year old female presents the office today for evaluation of right foot pain and to discuss MRI results after sustaining an injury.  Upon asking her to point to where she has majority pain she points the medial aspect of foot as well as the lateral aspect of the ankle.  She still has difficulty putting her weight on the ground even in the cam boot. Denies any systemic complaints such as fevers, chills, nausea, vomiting. No acute changes since last appointment, and no other complaints at this time.   Objective: AAO x3, NAD DP/PT pulses palpable bilaterally, CRT less than 3 seconds There is tenderness palpation still along the medial aspect the foot on the navicular tuberosity medial cuneiform.  She does have tenderness along the course the ATFL minimally along the course the peroneal tendon.  There is edema to this area as well.  There is decreased edema overall to the foot there is no cyst present.  There is no other area of pinpoint tenderness. No pain with calf compression, swelling, warmth, erythema  Assessment: Contusion navicular right side, ATFL tear with split tear of the peroneus brevis tendon  Plan: -All treatment options discussed with the patient including all alternatives, risks, complications.  -I reviewed my findings with her.  He had a discussion regarding treatment options with conservative as well as surgical.  For now continue immobilization, ice and elevation.  She states that she would like to go and proceed with surgery if needed.  Discussed conservative treatment as well including physical therapy and immobilization.  We will tentatively schedule her for surgery and I will see her back prior to this for further evaluation.  She agrees this plan. -Patient encouraged to call the office with any questions, concerns, change in symptoms.   Trula Slade DPM

## 2019-12-28 ENCOUNTER — Other Ambulatory Visit: Payer: Self-pay

## 2019-12-28 ENCOUNTER — Encounter: Payer: Self-pay | Admitting: Podiatry

## 2019-12-28 ENCOUNTER — Ambulatory Visit: Payer: Managed Care, Other (non HMO) | Admitting: Podiatry

## 2019-12-28 DIAGNOSIS — T148XXA Other injury of unspecified body region, initial encounter: Secondary | ICD-10-CM

## 2019-12-28 NOTE — Patient Instructions (Signed)

## 2019-12-29 ENCOUNTER — Encounter: Payer: Self-pay | Admitting: Podiatry

## 2019-12-29 NOTE — Progress Notes (Signed)
Subjective: 59 year old female presents the office today for follow-up evaluation of right foot and ankle pain.  She states majority tenderness is on the medial aspect the foot that she points to but she does get pain to the outside aspect of her ankle particular if she were to stand and move her foot in a certain direction.  She is to get swelling to both medial lateral aspects that she reports. Denies any systemic complaints such as fevers, chills, nausea, vomiting. No acute changes since last appointment, and no other complaints at this time.   Objective: AAO x3, NAD DP/PT pulses palpable bilaterally, CRT less than 3 seconds There is tenderness along the navicular tuberosity in the right foot.  There is tenderness along the course the peroneal tendon along the ATFL on the right side and there is localized edema present to the lateral aspect of the ankle.  Minimal in the medial aspect.  Overall the flexor and extensor tendons appear to be grossly intact.  No other areas of discomfort identified today. No open lesions or pre-ulcerative lesions.  No pain with calf compression, swelling, warmth, erythema  MRI 12/05/2019: IMPRESSION: 1. Bone marrow edema throughout the navicular bone, particularly within an elongated navicular tuberosity. No discrete fracture line. Findings suggestive of bone contusion. 2. Anterior talofibular ligament is torn with associated bone marrow edema at the fibular attachment. Thickened posterior talofibular ligament without complete tear. 3. Deltoid ligament sprain with associated marrow edema at the medial malleolar tip and lateral talus. 4. Short segment split tear of the peroneus brevis tendon at the level of the peroneal tubercle.  Assessment: 59 year old female contusion navicular right side with ATFL and split tear of the peroneus brevis tendon  Plan: -All treatment options discussed with the patient including all alternatives, risks, complications.  -I  again discussed both conservative as well as surgical treatment options.  At this time as she previously has had similar surgery in the left side she wants to go and proceed with the surgery on the right side.  We discussed conservative care including physical therapy, immobilization.  She has been immobilized and continues to have pain. -We will plan for right foot ATFL repair with peroneal tendon repair.  Discussed that the contusion will hopefully heal on its own with immobilization as well as the deltoid sprain. -The incision placement as well as the postoperative course was discussed with the patient. I discussed risks of the surgery which include, but not limited to, infection, bleeding, pain, swelling, need for further surgery, delayed or nonhealing, painful or ugly scar, numbness or sensation changes, over/under correction, recurrence, transfer lesions, further deformity,  DVT/PE, loss of toe/foot. Patient understands these risks and wishes to proceed with surgery. The surgical consent was reviewed with the patient all 3 pages were signed. No promises or guarantees were given to the outcome of the procedure. All questions were answered to the best of my ability. Before the surgery the patient was encouraged to call the office if there is any further questions. The surgery will be performed at the [facility] on an outpatient basis. -Patient encouraged to call the office with any questions, concerns, change in symptoms.   Trula Slade DPM

## 2020-01-06 ENCOUNTER — Encounter: Payer: Self-pay | Admitting: Podiatry

## 2020-01-06 ENCOUNTER — Telehealth: Payer: Self-pay

## 2020-01-06 NOTE — Telephone Encounter (Signed)
DOS 01/13/20 ANKLE STABILIZATION LATERAL RT - 28296 PERONEAL TENDON REPAIR RT - [id number]  CALL REFERENCE # - [id number]  [employer] EFFECTIVE DATE - 04/04/2015  DEDUCTIBLE - $250.00 WITH $250.00 REMAINING  OUT-OF-POCKET - $8800 WITH $8640 REMAINING  COPAY - $75.00  CO-INSURANCE 10%  Spoke to [employer] at [place], she stated procedure codes 28296 & [phone] do not require precert. Call reference number [id number] Nara Visa

## 2020-01-08 ENCOUNTER — Other Ambulatory Visit: Payer: Self-pay | Admitting: Podiatry

## 2020-01-08 MED ORDER — CEPHALEXIN 500 MG PO CAPS: 500.0000 mg | ORAL_CAPSULE | Freq: Three times a day (TID) | ORAL | 0 refills | Status: DC

## 2020-01-08 MED ORDER — PROMETHAZINE HCL 25 MG PO TABS: 25.0000 mg | ORAL_TABLET | Freq: Three times a day (TID) | ORAL | 0 refills | Status: DC | PRN

## 2020-01-08 MED ORDER — OXYCODONE-ACETAMINOPHEN 5-325 MG PO TABS: 1.0000 | ORAL_TABLET | Freq: Four times a day (QID) | ORAL | 0 refills | Status: DC | PRN

## 2020-01-08 NOTE — Progress Notes (Signed)
Post-op medications sent to the pharmacy.  

## 2020-01-13 ENCOUNTER — Encounter: Payer: Self-pay | Admitting: Podiatry

## 2020-01-13 DIAGNOSIS — T148XXA Other injury of unspecified body region, initial encounter: Secondary | ICD-10-CM | POA: Diagnosis not present

## 2020-01-14 ENCOUNTER — Telehealth: Payer: Self-pay | Admitting: *Deleted

## 2020-01-14 NOTE — Telephone Encounter (Signed)
Called and spoke with the patient and the patient is doing ok and the nerve block is wearing off and the pain is about a 9 or 10 and is taken the pain medicine and took the pain medicine on an empty stomach and got a little nauseous and I did eat something and was better and I have been icing my foot and elevating and I stated that we would see the patient on 01-18-2020 at 4:00 pm and to call the office if any concerns or questions. Tanya Peterson

## 2020-01-15 ENCOUNTER — Encounter: Payer: Self-pay | Admitting: Podiatry

## 2020-01-15 ENCOUNTER — Telehealth: Payer: Self-pay | Admitting: *Deleted

## 2020-01-15 NOTE — Telephone Encounter (Signed)
[employer] w/ [employer] calling and has questions concerning patient's evaluation for approval.When calling in may speak with any representative.Give ref. # [id number]

## 2020-01-15 NOTE — Telephone Encounter (Signed)
I called the patient back. See telephone note

## 2020-01-15 NOTE — Telephone Encounter (Signed)
Received a MyChart message from the patient about pain. I called the patient back. She states she is taking 2 of the percocet every 6 hours and still having pain. She states when she gets up to go to the bathroom and her foot is down it feels worse. I informed her to loosen the ACE bandage. Continue current pain medication. She has ibuprofen 600mg  that I prescribed previously and she can take that between the pain medication as well. Encouraged her to keep the foot elevated and apply ice, which she has been doing. Otherwise doing well. She did not appear to be in distress when talking to her. Encouraged her to call back if there were any further questions or concerns.   Trula Slade

## 2020-01-18 ENCOUNTER — Other Ambulatory Visit: Payer: Self-pay

## 2020-01-18 ENCOUNTER — Ambulatory Visit (INDEPENDENT_AMBULATORY_CARE_PROVIDER_SITE_OTHER): Payer: Managed Care, Other (non HMO) | Admitting: Podiatry

## 2020-01-18 ENCOUNTER — Ambulatory Visit (INDEPENDENT_AMBULATORY_CARE_PROVIDER_SITE_OTHER): Payer: Managed Care, Other (non HMO)

## 2020-01-18 VITALS — Temp 96.3°F

## 2020-01-18 DIAGNOSIS — T148XXA Other injury of unspecified body region, initial encounter: Secondary | ICD-10-CM

## 2020-01-18 DIAGNOSIS — S96911D Strain of unspecified muscle and tendon at ankle and foot level, right foot, subsequent encounter: Secondary | ICD-10-CM

## 2020-01-18 MED ORDER — IBUPROFEN 600 MG PO TABS: 600.0000 mg | ORAL_TABLET | Freq: Three times a day (TID) | ORAL | 0 refills | Status: DC | PRN

## 2020-01-18 MED ORDER — OXYCODONE-ACETAMINOPHEN 10-325 MG PO TABS: 1.0000 | ORAL_TABLET | Freq: Four times a day (QID) | ORAL | 0 refills | Status: DC | PRN

## 2020-01-19 ENCOUNTER — Telehealth: Payer: Self-pay | Admitting: *Deleted

## 2020-01-19 ENCOUNTER — Other Ambulatory Visit: Payer: Self-pay | Admitting: Podiatry

## 2020-01-19 DIAGNOSIS — S96911D Strain of unspecified muscle and tendon at ankle and foot level, right foot, subsequent encounter: Secondary | ICD-10-CM

## 2020-01-19 MED ORDER — OXYCODONE-ACETAMINOPHEN 10-325 MG PO TABS: 1.0000 | ORAL_TABLET | Freq: Four times a day (QID) | ORAL | 0 refills | Status: DC | PRN

## 2020-01-19 NOTE — Telephone Encounter (Signed)
Called and left a message for the patient stating that Dr Jacqualyn Posey did call the pain medicine into the pharmacy this morning. Lattie Haw

## 2020-01-20 ENCOUNTER — Ambulatory Visit (INDEPENDENT_AMBULATORY_CARE_PROVIDER_SITE_OTHER): Payer: Managed Care, Other (non HMO) | Admitting: Podiatry

## 2020-01-20 ENCOUNTER — Other Ambulatory Visit: Payer: Self-pay

## 2020-01-20 ENCOUNTER — Ambulatory Visit (INDEPENDENT_AMBULATORY_CARE_PROVIDER_SITE_OTHER): Payer: Managed Care, Other (non HMO)

## 2020-01-20 ENCOUNTER — Other Ambulatory Visit: Payer: Self-pay | Admitting: Podiatry

## 2020-01-20 DIAGNOSIS — M7671 Peroneal tendinitis, right leg: Secondary | ICD-10-CM

## 2020-01-20 DIAGNOSIS — T148XXA Other injury of unspecified body region, initial encounter: Secondary | ICD-10-CM

## 2020-01-20 DIAGNOSIS — Z9889 Other specified postprocedural states: Secondary | ICD-10-CM

## 2020-01-20 DIAGNOSIS — M79671 Pain in right foot: Secondary | ICD-10-CM | POA: Diagnosis not present

## 2020-01-20 NOTE — Progress Notes (Signed)
Subjective: Tanya Peterson is a 59 y.o. is seen today in office s/p right peroneal tendon repair with ATFL repair preformed on 2/24/12021.  Overall she states that she still having discomfort.  She does not appear to be in any distress today but she is taking pain medicine 2 tablets every 6 hours as well as ibuprofen 600 mg in between.  She states her pain level today is 9/10.  She is been wearing the boot but she does admit that she is gone without the boot some.  She denies any recent injury or falls.  Denies any systemic complaints such as fevers, chills, nausea, vomiting. No calf pain, chest pain, shortness of breath.   Objective: General: No acute distress, AAOx3  DP/PT pulses palpable 2/4, CRT < 3 sec to all digits.  Protective sensation intact. Motor function intact.  RIGHT foot: Incision is well coapted without any evidence of dehiscence with sutures, staples intact.  There is mild to moderate swelling and mild bruising.  Tenderness palpation surgical site.  No gross ankle stability present.  There is no external erythema there is no ascending cellulitis.  There is no drainage or pus or any clinical signs of infection noted today.  Still mild discomfort the medial aspect the foot on the area the bone contusion. No other areas of tenderness to bilateral lower extremities.  No other open lesions or pre-ulcerative lesions.  No pain with calf compression, swelling, warmth, erythema.   Assessment and Plan:  Status post right ankle surgery, doing well with no complications however still with discomfort  -Treatment options discussed including all alternatives, risks, and complications -X-rays obtained reviewed.  No evidence of acute fracture identified today. -She is having quite a bit of pain.  Prescribed Percocet 10/325.  So ibuprofen in between the pain medication.  Continue ice elevate.  Given the pain and swelling to keep her into a cam boot today although we discussed the cast.  Therefore to the  swelling she can ice the ankle but we discussed wear the cam boot at all times even while sleeping. -81 mg aspirin daily -Monitor for any clinical signs or symptoms of infection and directed to call the office immediately should any occur or go to the ER.   Follow-up as scheduled or sooner if needed.  Trula Slade DPM

## 2020-01-22 ENCOUNTER — Encounter: Payer: Self-pay | Admitting: Podiatry

## 2020-01-28 ENCOUNTER — Encounter: Payer: Self-pay | Admitting: Podiatry

## 2020-01-28 ENCOUNTER — Telehealth: Payer: Self-pay | Admitting: *Deleted

## 2020-01-28 ENCOUNTER — Ambulatory Visit (INDEPENDENT_AMBULATORY_CARE_PROVIDER_SITE_OTHER): Payer: Managed Care, Other (non HMO) | Admitting: Podiatry

## 2020-01-28 ENCOUNTER — Other Ambulatory Visit: Payer: Self-pay

## 2020-01-28 VITALS — BP 133/85 | HR 89 | Temp 97.1°F | Resp 16

## 2020-01-28 DIAGNOSIS — S99921A Unspecified injury of right foot, initial encounter: Secondary | ICD-10-CM

## 2020-01-28 DIAGNOSIS — M79671 Pain in right foot: Secondary | ICD-10-CM

## 2020-01-28 DIAGNOSIS — T148XXA Other injury of unspecified body region, initial encounter: Secondary | ICD-10-CM

## 2020-01-28 MED ORDER — CEPHALEXIN 500 MG PO CAPS: 500.0000 mg | ORAL_CAPSULE | Freq: Three times a day (TID) | ORAL | 0 refills | Status: DC

## 2020-01-28 MED ORDER — OXYCODONE-ACETAMINOPHEN 5-325 MG PO TABS: 1.0000 | ORAL_TABLET | Freq: Four times a day (QID) | ORAL | 0 refills | Status: DC | PRN

## 2020-01-28 NOTE — Telephone Encounter (Signed)
Orders to L. Cox, CMA for pre-cert, faxed to [employer].

## 2020-01-28 NOTE — Progress Notes (Signed)
Subjective: Tanya Peterson is a 59 y.o. is seen today in office s/p right peroneal tendon repair with ATFL repair preformed on 2/24/12021.  Presents today for follow-up.  She was seen last week by Dr. March Rummage as she had increased pain and she heard a pop to the surgical site.  Today she reports that she put her foot on the ground and she turned and notes when she heard a pop.  She was placed into a posterior splint.  She states that the splint was rubbing.  She still having pain but she does not appear to be in any distress today.  She has been out of pain medicine the last couple of days. Denies any systemic complaints such as fevers, chills, nausea, vomiting. No calf pain, chest pain, shortness of breath.   Objective: General: No acute distress, AAOx3  DP/PT pulses palpable 2/4, CRT < 3 sec to all digits.  Protective sensation intact. Motor function intact.  RIGHT foot: Incision is well coapted without any evidence of dehiscence with sutures, staples intact.  There is some mild erythema to the incision site but almost appears to be more of an allergy there are small red bumps present.  There is no drainage or pus there is no warmth.  There is swelling to the surgical site particularly in the course the peroneal tendons which is worsened since I last saw her initially.  There is tenderness palpation the course of the peroneal tendon.  No significant pain today along the ATFL repair site.  No other open lesions or pre-ulcerative lesions.  No pain with calf compression, swelling, warmth, erythema.   Assessment and Plan:  Status post right ankle surgery, concern for injury  -Treatment options discussed including all alternatives, risks, and complications -Remove the staples today developed the sutures intact.  Due to the popping sensation as well as increased swelling to the surgical site I recommended an MRI to further evaluate the area.  For now continue nonweightbearing I will keep her into the cam boot  so this is removed for the MRI.  -Continue to ice elevate -Refill pain medicine -Keflex  Return in about 1 week (around 02/04/2020).  Trula Slade DPM

## 2020-01-29 ENCOUNTER — Telehealth: Payer: Self-pay | Admitting: *Deleted

## 2020-01-29 NOTE — Telephone Encounter (Signed)
Called [employer] at [phone] and spoke with Mamie Levers and representative stated that the procedure code [id number] does not require a prior authorization and the reference number is [id number] and I also spoke with Stanton Kidney C from the benefits department and the procedure code [id number] does not require prior authorization and I called and left carolyn a message from [facility] and Hoyle Sauer is going to set the appointment up and call the patient. Lattie Haw

## 2020-01-30 ENCOUNTER — Ambulatory Visit (HOSPITAL_BASED_OUTPATIENT_CLINIC_OR_DEPARTMENT_OTHER)
Admission: RE | Admit: 2020-01-30 | Discharge: 2020-01-30 | Disposition: A | Discharge status: Home / Self Care | Payer: 59 | Source: Ambulatory Visit | Attending: Podiatry | Admitting: Podiatry

## 2020-01-30 ENCOUNTER — Other Ambulatory Visit: Payer: Self-pay

## 2020-01-30 DIAGNOSIS — T148XXA Other injury of unspecified body region, initial encounter: Secondary | ICD-10-CM | POA: Insufficient documentation

## 2020-01-30 DIAGNOSIS — M79671 Pain in right foot: Secondary | ICD-10-CM | POA: Insufficient documentation

## 2020-01-30 DIAGNOSIS — S99921A Unspecified injury of right foot, initial encounter: Secondary | ICD-10-CM | POA: Diagnosis present

## 2020-02-01 ENCOUNTER — Telehealth: Payer: Self-pay | Admitting: *Deleted

## 2020-02-01 NOTE — Telephone Encounter (Signed)
Pt called to speak with Cranford Mon, CMA.Marland Kitchen

## 2020-02-02 ENCOUNTER — Encounter: Payer: Self-pay | Admitting: Podiatry

## 2020-02-02 ENCOUNTER — Other Ambulatory Visit: Payer: Self-pay

## 2020-02-02 ENCOUNTER — Ambulatory Visit (INDEPENDENT_AMBULATORY_CARE_PROVIDER_SITE_OTHER): Payer: 59 | Admitting: Podiatry

## 2020-02-02 VITALS — Temp 97.2°F

## 2020-02-02 DIAGNOSIS — T148XXA Other injury of unspecified body region, initial encounter: Secondary | ICD-10-CM

## 2020-02-02 DIAGNOSIS — IMO0001 Reserved for inherently not codable concepts without codable children: Secondary | ICD-10-CM

## 2020-02-02 DIAGNOSIS — S9304XA Dislocation of right ankle joint, initial encounter: Secondary | ICD-10-CM

## 2020-02-03 NOTE — Telephone Encounter (Signed)
Patient was in the office yesterday and stated that she had the MRI done. Lattie Haw

## 2020-02-04 ENCOUNTER — Encounter: Payer: Self-pay | Admitting: Podiatry

## 2020-02-07 NOTE — Progress Notes (Signed)
Subjective: Tanya Peterson is a 59 y.o. is seen today in office s/p right peroneal tendon repair with ATFL repair preformed on 2/24/12021.  She reports today for follow-up evaluation discussed MRI findings.  She states that overall the swelling has improved and the pain is also improved.  After I last saw her she describes white bumps to her leg which should resolve.  This started right after starting the Keflex but quickly resolved and she had no other symptoms.  She currently denies any fevers, chills, nausea, vomiting.  No calf pain, chest pain, shortness of breath.   Objective: General: No acute distress, AAOx3  DP/PT pulses palpable 2/4, CRT < 3 sec to all digits.  Protective sensation intact. Motor function intact.  RIGHT foot: Incision is well coapted without any evidence of dehiscence with sutures intact.  There is decreased edema and the erythema has resolved.  There is no drainage or pus there is no fluctuation crepitation there is no malodor.  No obvious signs of infection or dehiscence noted today. No other open lesions or pre-ulcerative lesions.  No pain with calf compression, swelling, warmth, erythema.   MRI: Although evaluation is somewhat limited by artifact from postoperative change, the peroneal tendons appear intact and repair of the ATFL also appears intact. Note is made that the peroneus longus is positioned along the lateral aspect of the lateral malleolus.  Decreased marrow edema in the navicular compatible with resolving contusion. The patient has a type 3 navicular.  No change in a tiny osteochondral lesion of the lateral talar dome.  Assessment and Plan:  Status post right ankle surgery, peroneal subluxation  -Treatment options discussed including all alternatives, risks, and complications -Overall she has improved.  I remove the sutures to the any complications the incision remained well coapted.  I discussed the MRI findings with her.  Peroneal tendon as well as  ATFL repair intact however there is subluxation of the peroneus tendon.  Due to this I discussed with her return to surgery to place in anatomical position.  Letter to this next week but due to transportation issues she cannot do this.  We will plan on doing this in 2 weeks.  Continue immobilization in the cam boot.  Antibiotic ointment and a bandage applied.  She can start to shower in 2 days along the incision is doing well. -Monitor for any clinical signs or symptoms of infection and directed to call the office immediately should any occur or go to the ER.  Return for next week .  Trula Slade DPM

## 2020-02-09 ENCOUNTER — Ambulatory Visit (INDEPENDENT_AMBULATORY_CARE_PROVIDER_SITE_OTHER): Payer: 59 | Admitting: Podiatry

## 2020-02-09 ENCOUNTER — Other Ambulatory Visit: Payer: Self-pay

## 2020-02-09 ENCOUNTER — Telehealth: Payer: Self-pay | Admitting: *Deleted

## 2020-02-09 DIAGNOSIS — S9304XD Dislocation of right ankle joint, subsequent encounter: Secondary | ICD-10-CM

## 2020-02-09 DIAGNOSIS — IMO0001 Reserved for inherently not codable concepts without codable children: Secondary | ICD-10-CM

## 2020-02-09 NOTE — Telephone Encounter (Signed)
Patient is coming in today for an appointment with Dr Jacqualyn Posey at 3:45 pm. Tanya Peterson

## 2020-02-09 NOTE — Patient Instructions (Signed)

## 2020-02-10 NOTE — Progress Notes (Signed)
Subjective: Tanya Peterson is a 59 y.o. is seen today in office s/p right peroneal tendon repair with ATFL repair preformed on 2/24/12021.  She felt a pop after surgery and repeat MRI did show subluxation of the peroneal tendon.  At the time of injury she states that she was wearing the boot when she had put her foot on the ground but did not fall.  When she twisted this is when she heard a pop.  She states that overall the pain is improved.  As well as swelling. Denies any fevers, chills, nausea, vomiting.  No calf pain, chest pain, shortness of breath.   Objective: General: No acute distress, AAOx3  DP/PT pulses palpable 2/4, CRT < 3 sec to all digits.  Protective sensation intact. Motor function intact.  RIGHT foot: Incision is well coapted however there is mild separation superficial skin, bandaged, bloody drainage identified.  There is no purulence.  There is no surrounding erythema, ascending cellulitis.  No fluctuation, tissue.  Mild tenderness palpation with localized swelling.  She has been changing the bandage at home with antibiotic ointment. No other open lesions or pre-ulcerative lesions.  No pain with calf compression, swelling, warmth, erythema.   MRI: Although evaluation is somewhat limited by artifact from postoperative change, the peroneal tendons appear intact and repair of the ATFL also appears intact. Note is made that the peroneus longus is positioned along the lateral aspect of the lateral malleolus.  Decreased marrow edema in the navicular compatible with resolving contusion. The patient has a type 3 navicular.  No change in a tiny osteochondral lesion of the lateral talar dome.  Assessment and Plan:  Status post right ankle surgery, peroneal subluxation  -Treatment options discussed including all alternatives, risks, and complications -Discussed the return to surgery for subluxing peroneal tendon as well as her possible groove deepening procedure.  Discussed the  surgical postoperative course. Will plan on surgery next week.  For now I want her to apply a small amount of Betadine to the incision followed by dry sterile dressing. -The incision placement as well as the postoperative course was discussed with the patient. I discussed risks of the surgery which include, but not limited to, infection, bleeding, pain, swelling, need for further surgery, delayed or nonhealing, peroneal tendon can re-sublux, painful or ugly scar, numbness or sensation changes, over/under correction, recurrence, transfer lesions, further deformity, hardware failure, DVT/PE, loss of toe/foot. Patient understands these risks and wishes to proceed with surgery. The surgical consent was reviewed with the patient all 3 pages were signed. No promises or guarantees were given to the outcome of the procedure. All questions were answered to the best of my ability. Before the surgery the patient was encouraged to call the office if there is any further questions. The surgery will be performed at the [facility] on an outpatient basis.  No follow-ups on file.  Trula Slade DPM

## 2020-02-11 ENCOUNTER — Encounter: Payer: Managed Care, Other (non HMO) | Admitting: Podiatry

## 2020-02-11 ENCOUNTER — Other Ambulatory Visit: Payer: Self-pay | Admitting: Podiatry

## 2020-02-11 ENCOUNTER — Encounter: Payer: Self-pay | Admitting: Podiatry

## 2020-02-11 ENCOUNTER — Telehealth: Payer: Self-pay

## 2020-02-11 MED ORDER — PROMETHAZINE HCL 25 MG PO TABS: 25.0000 mg | ORAL_TABLET | Freq: Three times a day (TID) | ORAL | 0 refills | Status: DC | PRN

## 2020-02-11 MED ORDER — CEPHALEXIN 500 MG PO CAPS: 500.0000 mg | ORAL_CAPSULE | Freq: Three times a day (TID) | ORAL | 0 refills | Status: DC

## 2020-02-11 MED ORDER — OXYCODONE-ACETAMINOPHEN 10-325 MG PO TABS: 1.0000 | ORAL_TABLET | Freq: Four times a day (QID) | ORAL | 0 refills | Status: DC | PRN

## 2020-02-11 NOTE — Telephone Encounter (Signed)
DOS 02/17/2020  REPAIR DISLOCATING PERONEAL TENDON W/FIBULAR OSTEOTOMY RT - [id number]  SPOKE TO MICHELLE AT [facility] (REF# [id number]) SHE STATED NO PRECERT REQUIRED FOR PROCEDURE CODE 09811. NO PRECERT REQUIRED FOR ASSIST DR.  PER [facility] WEB SITE BENEFITS BELOW  PLAN DEDUTIBLE - $250.00 W/$0.00 REMAINING  OUT OF POCKET $1500 WITH $794.17 REMAINING  COPAY $75.00  COINSURANCE - 10%

## 2020-02-17 ENCOUNTER — Encounter: Payer: Self-pay | Admitting: Podiatry

## 2020-02-17 DIAGNOSIS — S9304XD Dislocation of right ankle joint, subsequent encounter: Secondary | ICD-10-CM

## 2020-02-18 ENCOUNTER — Telehealth: Payer: Self-pay | Admitting: *Deleted

## 2020-02-18 NOTE — Telephone Encounter (Signed)
Called and spoke with the patient and stated that I was calling to see how patient was doing after the surgery with Dr Jacqualyn Posey on Wednesday March 31st, 2021 and the patient is in pain and the pain scale is a 10 and patient stated that it had shooting pains and stabbing pains as well and has been icing it and elevating and the oxycodone is not lasting but 20 to 30 minutes and has 15 tablets left and takes every 6 hours and will be doing ibuprofen 600 mg and I stated to call the office if any concerns or questions. Lattie Haw

## 2020-02-22 ENCOUNTER — Other Ambulatory Visit: Payer: Self-pay

## 2020-02-22 ENCOUNTER — Encounter: Payer: Self-pay | Admitting: Podiatry

## 2020-02-22 ENCOUNTER — Ambulatory Visit (INDEPENDENT_AMBULATORY_CARE_PROVIDER_SITE_OTHER): Payer: 59 | Admitting: Podiatry

## 2020-02-22 ENCOUNTER — Ambulatory Visit (INDEPENDENT_AMBULATORY_CARE_PROVIDER_SITE_OTHER): Payer: 59

## 2020-02-22 ENCOUNTER — Encounter: Payer: 59 | Admitting: Podiatry

## 2020-02-22 VITALS — BP 114/77 | HR 95 | Temp 98.0°F | Resp 14

## 2020-02-22 DIAGNOSIS — S9304XD Dislocation of right ankle joint, subsequent encounter: Secondary | ICD-10-CM

## 2020-02-22 DIAGNOSIS — IMO0001 Reserved for inherently not codable concepts without codable children: Secondary | ICD-10-CM

## 2020-02-22 DIAGNOSIS — Z9889 Other specified postprocedural states: Secondary | ICD-10-CM

## 2020-02-22 MED ORDER — OXYCODONE-ACETAMINOPHEN 10-325 MG PO TABS: 1.0000 | ORAL_TABLET | Freq: Three times a day (TID) | ORAL | 0 refills | Status: DC | PRN

## 2020-02-22 MED ORDER — IBUPROFEN 600 MG PO TABS: 600.0000 mg | ORAL_TABLET | Freq: Three times a day (TID) | ORAL | 0 refills | Status: DC | PRN

## 2020-02-25 ENCOUNTER — Encounter: Payer: Self-pay | Admitting: Podiatry

## 2020-02-26 ENCOUNTER — Other Ambulatory Visit: Payer: Self-pay

## 2020-02-26 ENCOUNTER — Ambulatory Visit (INDEPENDENT_AMBULATORY_CARE_PROVIDER_SITE_OTHER): Payer: 59 | Admitting: Podiatry

## 2020-02-26 ENCOUNTER — Encounter: Payer: Self-pay | Admitting: Podiatry

## 2020-02-26 ENCOUNTER — Other Ambulatory Visit: Payer: Self-pay | Admitting: Podiatry

## 2020-02-26 VITALS — Temp 98.0°F

## 2020-02-26 DIAGNOSIS — T7840XA Allergy, unspecified, initial encounter: Secondary | ICD-10-CM

## 2020-02-26 DIAGNOSIS — S9304XD Dislocation of right ankle joint, subsequent encounter: Secondary | ICD-10-CM

## 2020-02-26 DIAGNOSIS — IMO0001 Reserved for inherently not codable concepts without codable children: Secondary | ICD-10-CM

## 2020-02-26 MED ORDER — DOXYCYCLINE HYCLATE 100 MG PO TABS: 100.0000 mg | ORAL_TABLET | Freq: Two times a day (BID) | ORAL | 0 refills | Status: DC

## 2020-02-28 NOTE — Progress Notes (Signed)
Subjective: Tanya Peterson is a 59 y.o. is seen today in office s/p right ankle repair of subluxating peroneal longus tendon preformed on 02/17/2020.  She states that she is taking Percocet 1 tablet every 8 hours and then the ibuprofen in between so she is taking something every 4 hours.  She states that she is still having pain but she does not appear to be in distress.  Denies any systemic complaints such as fevers, chills, nausea, vomiting. No calf pain, chest pain, shortness of breath.   Objective: General: No acute distress, AAOx3  DP/PT pulses palpable 2/4, CRT < 3 sec to all digits.  Protective sensation intact. Motor function intact.  Right ankle: Incision is well coapted without any evidence of dehiscence with sutures, staples intact. There is no surrounding erythema, ascending cellulitis, fluctuance, crepitus, malodor, drainage/purulence. There is mild edema around the surgical site. There is mild pain along the surgical site.  There is no evidence of dehiscence or infection on surgical site. No other areas of tenderness to bilateral lower extremities.  No other open lesions or pre-ulcerative lesions.  No pain with calf compression, swelling, warmth, erythema.       Assessment and Plan:  Status post right ankle repair subluxating peroneal tendon, doing well with no complications   -Treatment options discussed including all alternatives, risks, and complications -X-rays obtained reviewed.  No evidence of acute fracture identified today. -A small amount of antibiotic ointment was applied to the incision followed by dry sterile dressing.  Keep the dressing clean, dry, intact -Remain nonweightbearing -Ice/elevation -Pain medication as needed. -Monitor for any clinical signs or symptoms of infection and DVT/PE and directed to call the office immediately should any occur or go to the ER. -Follow-up as scheduled or sooner if any problems arise. In the meantime, encouraged to call the office  with any questions, concerns, change in symptoms.   Celesta Gentile, DPM

## 2020-02-28 NOTE — Progress Notes (Signed)
  Subjective: Tanya Peterson is a 59 y.o. is seen today in office s/p right ankle repair of subluxating peroneal longus tendon preformed on 02/17/2020.  She presents today as she had sent me a message through my chart stating she had drainage on the bandage and concern for infection.  I already called in doxycycline and switch her antibiotics and offered her appointment and she presents today for evaluation.  She currently denies any fevers, chills, nausea or vomiting.  No calf pain, chest pain, shortness of breath.  She states that she felt that she had a fever yesterday but her temperature was 98 which she states is high for her  Objective: General: No acute distress, AAOx3  DP/PT pulses palpable 2/4, CRT < 3 sec to all digits.  Protective sensation intact. Motor function intact.  Right ankle: Incision is well coapted without any evidence of dehiscence with sutures, staples intact.  There are small, erythematous bumps present on the incision appears to be almost in the distinct pattern with the bandage.  I think this is more of an allergic reaction as opposed to infection.  Incision is healing well without any dehiscence.  I am not able to identify any drainage coming from the incision today so therefore no wound culture was obtained.  Mild improvement swelling.  Still some mild discomfort.  No other areas of discomfort identified. No other open lesions or pre-ulcerative lesions.  No pain with calf compression, swelling, warmth, erythema.         Assessment and Plan:  Status post right ankle repair subluxating peroneal tendon  -Treatment options discussed including all alternatives, risks, and complications -I do believe she is having allergic reaction or sensitivity to the antibiotic ointment.  Patient is a small amount of Betadine incision followed by dry sterile dressing.  I want her to continue nonweightbearing, ice elevation.  We will switch her antibiotic to doxycycline as a  preventative. -Monitor for any clinical signs or symptoms of infection and directed to call the office immediately should any occur or go to the ER.  Follow-up as scheduled or sooner if needed  Trula Slade DPM

## 2020-03-03 ENCOUNTER — Ambulatory Visit (INDEPENDENT_AMBULATORY_CARE_PROVIDER_SITE_OTHER): Payer: 59 | Admitting: Podiatry

## 2020-03-03 ENCOUNTER — Encounter: Payer: 59 | Admitting: Podiatry

## 2020-03-03 ENCOUNTER — Other Ambulatory Visit: Payer: Self-pay

## 2020-03-03 DIAGNOSIS — T7840XA Allergy, unspecified, initial encounter: Secondary | ICD-10-CM

## 2020-03-03 DIAGNOSIS — Z9889 Other specified postprocedural states: Secondary | ICD-10-CM

## 2020-03-03 DIAGNOSIS — IMO0001 Reserved for inherently not codable concepts without codable children: Secondary | ICD-10-CM

## 2020-03-03 DIAGNOSIS — S9304XD Dislocation of right ankle joint, subsequent encounter: Secondary | ICD-10-CM

## 2020-03-04 ENCOUNTER — Ambulatory Visit (INDEPENDENT_AMBULATORY_CARE_PROVIDER_SITE_OTHER): Payer: 59 | Admitting: Sports Medicine

## 2020-03-04 ENCOUNTER — Encounter: Payer: Self-pay | Admitting: Sports Medicine

## 2020-03-04 ENCOUNTER — Ambulatory Visit (INDEPENDENT_AMBULATORY_CARE_PROVIDER_SITE_OTHER): Payer: 59

## 2020-03-04 DIAGNOSIS — M7501 Adhesive capsulitis of right shoulder: Secondary | ICD-10-CM | POA: Insufficient documentation

## 2020-03-04 DIAGNOSIS — M79601 Pain in right arm: Secondary | ICD-10-CM | POA: Diagnosis not present

## 2020-03-04 MED ORDER — MELOXICAM 15 MG PO TABS: ORAL_TABLET | ORAL | 3 refills | Status: DC

## 2020-03-04 NOTE — Assessment & Plan Note (Signed)
This is a pleasant 59 year old female, in January she fell in a hole, she impacted her right inner upper arm. Since then she has had pain, worse with flexion of the shoulder, pronation of her forearm. She has been on oral anti-inflammatories, conservative treatment for greater than 6 weeks without improvement, we are going to proceed with x-rays of the right humerus, MRI of the right humerus, biceps rehab exercises, discontinue ibuprofen and switching to meloxicam. Of note she is also under the care of Dr. Earleen Newport for multiple tendon and ligamentous injuries in her foot and ankle.

## 2020-03-04 NOTE — Progress Notes (Signed)
    Procedures performed today:    None.  Independent interpretation of notes and tests performed by another provider:   None.  Brief History, Exam, Impression, and Recommendations:    Right arm pain This is a pleasant 59 year old female, in January she fell in a hole, she impacted her right inner upper arm. Since then she has had pain, worse with flexion of the shoulder, pronation of her forearm. She has been on oral anti-inflammatories, conservative treatment for greater than 6 weeks without improvement, we are going to proceed with x-rays of the right humerus, MRI of the right humerus, biceps rehab exercises, discontinue ibuprofen and switching to meloxicam. Of note she is also under the care of Dr. Earleen Newport for multiple tendon and ligamentous injuries in her foot and ankle.    ___________________________________________ Gwen Her. Dianah Field, M.D., ABFM., CAQSM. Primary Care and [place] Instructor of [place] of [facility] of [facility]

## 2020-03-07 NOTE — Progress Notes (Signed)
  Subjective: Tanya Peterson is a 58 y.o. is seen today in office s/p right ankle repair of subluxating peroneal longus tendon preformed on 02/17/2020.  Overall she states that she is doing better and the incision is looking much better.  She spontaneously amount of exam the incision followed by dressing.  Swelling the pain is also improved.  No recent falls or injury.  She has no other concerns. Denies any fevers, chills, nausea or vomiting.  No calf pain, chest pain, shortness of breath.  She states that she felt that she had a fever yesterday but her temperature was 98 which she states is high for her  Objective: General: No acute distress, AAOx3  DP/PT pulses palpable 2/4, CRT < 3 sec to all digits.  Protective sensation intact. Motor function intact.  Right ankle: Incision is well coapted without any evidence of dehiscence with sutures, staples intact.  There is erythema along the incision has resolved.  There is mild swelling but also improved.  There is no increase in warmth there is no ascending cellulitis there is no drainage or pus.  Mild discomfort to palpation today. No other open lesions or pre-ulcerative lesions.  No pain with calf compression, swelling, warmth, erythema.           Assessment and Plan:  Status post right ankle repair subluxating peroneal tendon  -Treatment options discussed including all alternatives, risks, and complications -I reviewed the sutures today as well as some of the staples.  Small amount of Betadine was painted the incision followed by dry sterile dressing.  She can wash with soap and water daily dry thoroughly and apply a similar bandage.  Continue nonweightbearing.  Ice elevate.  Return in about 1 week (around 03/10/2020).  Trula Slade DPM

## 2020-03-11 ENCOUNTER — Ambulatory Visit (INDEPENDENT_AMBULATORY_CARE_PROVIDER_SITE_OTHER): Payer: 59 | Admitting: Podiatry

## 2020-03-11 ENCOUNTER — Encounter: Payer: Self-pay | Admitting: Podiatry

## 2020-03-11 ENCOUNTER — Encounter: Payer: 59 | Admitting: Podiatry

## 2020-03-11 ENCOUNTER — Other Ambulatory Visit: Payer: Self-pay

## 2020-03-11 VITALS — Temp 98.3°F

## 2020-03-11 DIAGNOSIS — S9304XD Dislocation of right ankle joint, subsequent encounter: Secondary | ICD-10-CM

## 2020-03-11 DIAGNOSIS — IMO0001 Reserved for inherently not codable concepts without codable children: Secondary | ICD-10-CM

## 2020-03-12 ENCOUNTER — Ambulatory Visit (INDEPENDENT_AMBULATORY_CARE_PROVIDER_SITE_OTHER): Payer: 59

## 2020-03-12 DIAGNOSIS — M79601 Pain in right arm: Secondary | ICD-10-CM | POA: Diagnosis not present

## 2020-03-15 ENCOUNTER — Ambulatory Visit (INDEPENDENT_AMBULATORY_CARE_PROVIDER_SITE_OTHER): Payer: 59 | Admitting: Sports Medicine

## 2020-03-15 ENCOUNTER — Other Ambulatory Visit: Payer: Self-pay

## 2020-03-15 DIAGNOSIS — M79601 Pain in right arm: Secondary | ICD-10-CM

## 2020-03-15 NOTE — Assessment & Plan Note (Signed)
This pleasant 59 year old female returns, she said chronic right mid humeral pain, the pain had not declared itself at the last visit, ultimately obtained an MRI of her humerus which was normal. On exam today symptoms have now declared themselves more is bicipital and rotator cuff with a positive empty can sign, Neer sign, positive Hawkins and Yergason tests. Today I injected her subacromial bursa and biceps sheath with ultrasound guidance, return to see me in 1 month, rotator cuff rehab exercises given.

## 2020-03-15 NOTE — Progress Notes (Signed)
    Procedures performed today:    Procedure: Real-time Ultrasound Guided injection of the right subacromial bursa Device: Samsung HS60  Verbal informed consent obtained.  Time-out conducted.  Noted no overlying erythema, induration, or other signs of local infection.  Skin prepped in a sterile fashion.  Local anesthesia: Topical Ethyl chloride.  With sterile technique and under real time ultrasound guidance: 1 cc Kenalog 40, 1 cc lidocaine, 1 cc bupivacaine injected easily Completed without difficulty  Pain immediately resolved suggesting accurate placement of the medication.  Advised to call if fevers/chills, erythema, induration, drainage, or persistent bleeding.  Images permanently stored and available for review in the ultrasound unit.  Impression: Technically successful ultrasound guided injection.  Procedure: Real-time Ultrasound Guided injection of the right bicep sheath Device: Samsung HS60  Verbal informed consent obtained.  Time-out conducted.  Noted no overlying erythema, induration, or other signs of local infection.  Skin prepped in a sterile fashion.  Local anesthesia: Topical Ethyl chloride.  With sterile technique and under real time ultrasound guidance: 1 cc Kenalog 40, 1 cc lidocaine, 1 cc bupivacaine injected easily Completed without difficulty  Pain immediately resolved suggesting accurate placement of the medication.  Advised to call if fevers/chills, erythema, induration, drainage, or persistent bleeding.  Images permanently stored and available for review in the ultrasound unit.  Impression: Technically successful ultrasound guided injection.  Independent interpretation of notes and tests performed by another provider:   None.  Brief History, Exam, Impression, and Recommendations:    Right arm pain This pleasant 59 year old female returns, she said chronic right mid humeral pain, the pain had not declared itself at the last visit, ultimately obtained an  MRI of her humerus which was normal. On exam today symptoms have now declared themselves more is bicipital and rotator cuff with a positive empty can sign, Neer sign, positive Hawkins and Yergason tests. Today I injected her subacromial bursa and biceps sheath with ultrasound guidance, return to see me in 1 month, rotator cuff rehab exercises given.    ___________________________________________ Gwen Her. Dianah Field, M.D., ABFM., CAQSM. Primary Care and [place] Instructor of [place] of [facility] of Medicine

## 2020-03-17 ENCOUNTER — Telehealth: Payer: Self-pay | Admitting: *Deleted

## 2020-03-17 ENCOUNTER — Encounter: Payer: 59 | Admitting: Podiatry

## 2020-03-17 NOTE — Telephone Encounter (Signed)
Sent the anklet out yesterday. Lattie Haw

## 2020-03-18 ENCOUNTER — Encounter: Payer: 59 | Admitting: Podiatry

## 2020-03-18 NOTE — Progress Notes (Signed)
  Subjective: Tanya Peterson is a 59 y.o. is seen today in office s/p right ankle repair of subluxating peroneal longus tendon preformed on 02/17/2020.  She states that she is doing better.  She does get some occasional shooting pain near the incision site but this is intermittent and overall feeling better.  She presents today to have the remainder the staples removed.  She has no other concerns today.  She is currently meloxicam for her arm. Denies any fevers, chills, nausea or vomiting.  No calf pain, chest pain, shortness of breath.  She states that she felt that she had a fever yesterday but her temperature was 98 which she states is high for her  Objective: General: No acute distress, AAOx3  DP/PT pulses palpable 2/4, CRT < 3 sec to all digits.  Protective sensation intact. Motor function intact.  Right ankle: Incision is well coapted without any evidence of dehiscence with staples intact.  There is mild edema but has improved.  There is no erythema or warmth.  Mild tenderness palpation surgical site.  No other areas of discomfort.  She is starting to get an ingrown toenail to the lateral aspect of the right hallux toenail is localized edema and erythema and a spicule of nail is present. No other open lesions or pre-ulcerative lesions.  No pain with calf compression, swelling, warmth, erythema.    Assessment and Plan:  Status post right ankle repair subluxating peroneal tendon  -Treatment options discussed including all alternatives, risks, and complications -Remainder the staples were removed.  Incision remained well coapted.  Continue to wash the area soap and water and dry thoroughly.  Continue the bandage.  Hold any antibiotic ointment.  Continue nonweightbearing in cam boot.  She has declined a cast previously and after surgery.  Continue ice elevate. -I debrided the symptomatic portion of ingrown toenail to the right hallux without any complications.  Epson salt soaks to the toe.  Small  amount of Betadine followed by a Band-Aid.  Monitoring signs or symptoms of infection or any worsening pain.  If continues may need to have a partial nail avulsion.  Trula Slade DPM

## 2020-03-25 ENCOUNTER — Encounter: Payer: Self-pay | Admitting: Podiatry

## 2020-03-25 ENCOUNTER — Other Ambulatory Visit: Payer: Self-pay

## 2020-03-25 ENCOUNTER — Ambulatory Visit (INDEPENDENT_AMBULATORY_CARE_PROVIDER_SITE_OTHER): Payer: 59 | Admitting: Podiatry

## 2020-03-25 DIAGNOSIS — Z9889 Other specified postprocedural states: Secondary | ICD-10-CM

## 2020-03-25 DIAGNOSIS — IMO0001 Reserved for inherently not codable concepts without codable children: Secondary | ICD-10-CM

## 2020-03-25 DIAGNOSIS — S93331A Other subluxation of right foot, initial encounter: Secondary | ICD-10-CM | POA: Insufficient documentation

## 2020-03-25 DIAGNOSIS — S9304XA Dislocation of right ankle joint, initial encounter: Secondary | ICD-10-CM | POA: Insufficient documentation

## 2020-03-25 DIAGNOSIS — S9304XD Dislocation of right ankle joint, subsequent encounter: Secondary | ICD-10-CM

## 2020-03-25 NOTE — Progress Notes (Signed)
  Subjective: Tanya Peterson is a 59 y.o. is seen today in office s/p right ankle repair of subluxating peroneal longus tendon preformed on 02/17/2020.  She states her pain level currently is 1-2/10 at max.  Swelling is improved.  She is from the boot and nonweightbearing.  She still been icing elevating.  The right toenail is also doing much better on the big toe.  Denies any drainage or pus. Denies any fevers, chills, nausea or vomiting.  No calf pain, chest pain, shortness of breath.  She states that she felt that she had a fever yesterday but her temperature was 98 which she states is high for her  Objective: General: No acute distress, AAOx3  DP/PT pulses palpable 2/4, CRT < 3 sec to all digits.  Protective sensation intact. Motor function intact.  Right ankle: Incision is well coapted without any evidence of dehiscence and scar is formed.  There is decreased edema but still some mild edema present but there is no erythema or warmth.  No area of pinpoint tenderness.  Upon placing the ankle through range of motion the peroneal tendons do not appear to be subluxed.  No other areas of discomfort identified at this time. Right hallux toenail with any significant pain there is no purulence or drainage. No other open lesions or pre-ulcerative lesions.  No pain with calf compression, swelling, warmth, erythema.    Assessment and Plan:  Status post right ankle repair subluxating peroneal tendon  -Treatment options discussed including all alternatives, risks, and complications -Overall she is doing better.  We will start physical therapy.  Prescription for [facility] therapy at the [facility] location was written.  I want her to start with partial weightbearing in the cam boot and physical therapy before transition to fully been in the cam boot.  She can plan on being full weightbearing about 2 weeks foot in the cam boot at all times.  Continue to ice elevate. -Continue to monitor the right hallux  toenail.    Return in about 4 weeks (around 04/22/2020).  Trula Slade DPM

## 2020-03-25 NOTE — Addendum Note (Signed)
Addended by: Cranford Mon R on: 03/25/2020 02:37 PM   Modules accepted: Orders

## 2020-04-07 ENCOUNTER — Ambulatory Visit (INDEPENDENT_AMBULATORY_CARE_PROVIDER_SITE_OTHER): Payer: 59 | Admitting: Sports Medicine

## 2020-04-07 ENCOUNTER — Other Ambulatory Visit: Payer: Self-pay

## 2020-04-07 DIAGNOSIS — M79601 Pain in right arm: Secondary | ICD-10-CM

## 2020-04-07 MED ORDER — OXYCODONE-ACETAMINOPHEN 10-325 MG PO TABS: 1.0000 | ORAL_TABLET | Freq: Three times a day (TID) | ORAL | 0 refills | Status: DC | PRN

## 2020-04-07 NOTE — Progress Notes (Signed)
    Procedures performed today:    None.  Independent interpretation of notes and tests performed by another provider:   None.  Brief History, Exam, Impression, and Recommendations:    Right arm pain Tanya Peterson returns, she is a pleasant 59 year old female, she suffered a fall and had severe pain in her right upper arm, an MRI of the humerus was unremarkable but we did not see much of the glenohumeral joint, proximal biceps or rotator cuff. Of note she did not have any pain before the fall suggesting that this is secondary to her fall, this will be important with regards to future insurance plans. After failure of conservative treatment at the last visit her symptoms have declared themselves as impingement and bicipital, we injected her subacromial bursa and her biceps tendon sheath, at the time there did appear to be some fraying of the long head of the biceps on ultrasound. She had partial and temporary improvement during the effect of the local anesthetic but all of her pain has returned. Today she continues to have a mildly positive Yergason test, a positive speeds test, her impingement symptoms are minimal today. At this point I do think she probably needs surgical consultation, potentially a biceps tenodesis and subacromial decompression, I can hold off on ordering an additional MRI at this point and I would like to defer her care to Dr. Griffin Basil with shoulder surgery. Adding some Percocet for pain relief in the meantime, she will only use this for breakthrough pain on top of meloxicam. She can return to see me on an as-needed basis.    ___________________________________________ Gwen Her. Dianah Field, M.D., ABFM., CAQSM. Primary Care and [place] Instructor of [place] of [facility] of Medicine

## 2020-04-07 NOTE — Assessment & Plan Note (Addendum)
Tanya Peterson returns, she is a pleasant 59 year old female, she suffered a fall and had severe pain in her right upper arm, an MRI of the humerus was unremarkable but we did not see much of the glenohumeral joint, proximal biceps or rotator cuff. Of note she did not have any pain before the fall suggesting that this is secondary to her fall, this will be important with regards to future insurance plans. After failure of conservative treatment at the last visit her symptoms have declared themselves as impingement and bicipital, we injected her subacromial bursa and her biceps tendon sheath, at the time there did appear to be some fraying of the long head of the biceps on ultrasound. She had partial and temporary improvement during the effect of the local anesthetic but all of her pain has returned. Today she continues to have a mildly positive Yergason test, a positive speeds test, her impingement symptoms are minimal today. At this point I do think she probably needs surgical consultation, potentially a biceps tenodesis and subacromial decompression, I can hold off on ordering an additional MRI at this point and I would like to defer her care to Dr. Griffin Basil with shoulder surgery. Adding some Percocet for pain relief in the meantime, she will only use this for breakthrough pain on top of meloxicam. She can return to see me on an as-needed basis.

## 2020-04-12 ENCOUNTER — Ambulatory Visit: Payer: 59 | Admitting: Sports Medicine

## 2020-04-22 ENCOUNTER — Encounter: Payer: Self-pay | Admitting: Podiatry

## 2020-04-22 ENCOUNTER — Ambulatory Visit (INDEPENDENT_AMBULATORY_CARE_PROVIDER_SITE_OTHER): Payer: 59 | Admitting: Podiatry

## 2020-04-22 ENCOUNTER — Other Ambulatory Visit: Payer: Self-pay

## 2020-04-22 VITALS — Temp 97.6°F

## 2020-04-22 DIAGNOSIS — S9304XD Dislocation of right ankle joint, subsequent encounter: Secondary | ICD-10-CM

## 2020-04-22 DIAGNOSIS — IMO0001 Reserved for inherently not codable concepts without codable children: Secondary | ICD-10-CM

## 2020-04-22 DIAGNOSIS — Z9889 Other specified postprocedural states: Secondary | ICD-10-CM

## 2020-04-22 NOTE — Progress Notes (Signed)
  Subjective: Tanya Peterson is a 59 y.o. is seen today in office s/p right ankle repair of subluxating peroneal longus tendon preformed on 02/17/2020.  She been doing well and she is started physical therapy.  She has started to be partial weightbearing at physical therapy with a crutch in cam boot but she is still using the knee scooter otherwise.  She has some occasional discomfort after physical therapy but otherwise tolerable and having no other concerns.  No recent injury or falls.    She is scheduled for right shoulder surgery later this month.   Denies any fevers, chills, nausea or vomiting.  No calf pain, chest pain, shortness of breath.  She states that she felt that she had a fever yesterday but her temperature was 98 which she states is high for her  Objective: General: No acute distress, AAOx3  DP/PT pulses palpable 2/4, CRT < 3 sec to all digits.  Protective sensation intact. Motor function intact.  Right ankle: Incision is well coapted without any evidence of dehiscence and scar is formed.  There is mild edema but there is no erythema or warmth.  Mild tenderness outpatient treatment and surgical site no other areas of discomfort identified at this time.  MMT 4/5 on the right and 5/5 on the left.  On the left there is minimal discomfort and swelling along the sinus tarsi.  No area of pinpoint tenderness in the left ankle or foot.  No other open lesions or pre-ulcerative lesions.  No pain with calf compression, swelling, warmth, erythema.    Assessment and Plan:  Status post right ankle repair subluxating peroneal tendon  -Treatment options discussed including all alternatives, risks, and complications -At this point discussed with her physical therapy she can start to transition to partial weightbearing in the cam boot with a crutch and over the next 2 weeks she can start to transition to weightbearing in the cam boot as she is progresses.  Under continue with compression anklet as  well as ice elevation. -We will extend her out of work until June 19, 2020.  Return in about 3 weeks (around 05/13/2020).  Trula Slade DPM

## 2020-04-25 ENCOUNTER — Telehealth: Payer: Self-pay | Admitting: *Deleted

## 2020-04-25 NOTE — Telephone Encounter (Signed)
[facility] in [facility] boot- can you please make sure that someone has faxed my note to them. It is the [facility] in [employer]. Thanks.

## 2020-04-25 NOTE — Telephone Encounter (Signed)
Faxed copy of Dr. Leigh Aurora orders to [employer] [facility].

## 2020-04-25 NOTE — Telephone Encounter (Signed)
[facility] - [place] [facility] states they need weight bearing status for pt, fax [phone].

## 2020-04-28 ENCOUNTER — Encounter: Payer: Self-pay | Admitting: Podiatry

## 2020-04-28 NOTE — Telephone Encounter (Signed)
Faxed LOV 04/22/2020 to [facility].

## 2020-05-13 ENCOUNTER — Encounter: Payer: Self-pay | Admitting: Podiatry

## 2020-05-13 ENCOUNTER — Ambulatory Visit: Payer: 59 | Admitting: Podiatry

## 2020-05-13 ENCOUNTER — Other Ambulatory Visit: Payer: Self-pay

## 2020-05-13 DIAGNOSIS — IMO0001 Reserved for inherently not codable concepts without codable children: Secondary | ICD-10-CM

## 2020-05-13 DIAGNOSIS — M779 Enthesopathy, unspecified: Secondary | ICD-10-CM

## 2020-05-13 DIAGNOSIS — Z9889 Other specified postprocedural states: Secondary | ICD-10-CM

## 2020-05-13 DIAGNOSIS — S9304XD Dislocation of right ankle joint, subsequent encounter: Secondary | ICD-10-CM

## 2020-05-13 DIAGNOSIS — M7751 Other enthesopathy of right foot: Secondary | ICD-10-CM

## 2020-05-16 NOTE — Progress Notes (Addendum)
  Subjective: Tanya Peterson is a 59 y.o. is seen today in office s/p right ankle repair of subluxating peroneal longus tendon preformed on 02/17/2020.  Still gets some swelling to the foot as well as intermittent discomfort.  She is not able to do physical therapy currently as she just recently shoulder surgery.  She has been doing some exercises at home on her own.  Also has tried steroid injection in the left ankle today she is getting some discomfort in the area as well likely from compensation.  Currently denies any fevers, chills, nausea, vomiting.  No calf pain, chest pain, shortness of breath.  Objective: General: No acute distress, AAOx3  DP/PT pulses palpable 2/4, CRT < 3 sec to all digits.  Protective sensation intact. Motor function intact.  Right ankle: Incision is well coapted without any evidence of dehiscence and scar is formed.  There is mild edema still present but there is no erythema or warmth.  Mild tenderness palpation directly on the surgical site.  There is no drainage or discomfort identified in the right side.  There is not appear to be any subluxation of the peroneal tendon.  LEFT: Tenderness palpation on the course of the sinus tarsi with mild edema.  No erythema or warmth.  No pain the peroneal tendon.  Noted that the areas of discomfort identified.  No other open lesions or pre-ulcerative lesions.  No pain with calf compression, swelling, warmth, erythema.    Assessment and Plan:  Status post right ankle repair subluxating peroneal tendon; left subtalar joint capsulitis   -Treatment options discussed including all alternatives, risks, and complications -Steroid injection performed to the left side.  Skin was cleaned with Betadine and a mixture of 1 cc Kenalog 10, 0.5 cc of Marcaine plain and 0.5 cc of lidocaine plain was infiltrated into the area maximal tenderness the sinus tarsi without complications.  Postinjection care discussed. -She is wearing a boot on the right  side because she is having difficulty tying her shoe with her shoulder surgery.  Discussed continue home rehab exercises as well.  We will return to physical therapy when able. Also recommend Occupational Therapy possible with her goal of getting her back to work without restrictions. Also I would like for them work on ROM of the left ankle and general rehab.   Return in about 6 weeks (around 06/24/2020).  Trula Slade DPM

## 2020-05-17 ENCOUNTER — Telehealth: Payer: Self-pay | Admitting: *Deleted

## 2020-05-17 NOTE — Telephone Encounter (Signed)
Faxed [employer] with clinicals to [facility].

## 2020-05-17 NOTE — Telephone Encounter (Signed)
-----   Message from Trula Slade, DPM sent at 05/16/2020  6:16 AM EDT ----- Can you please send my last clinic note to her physical therapist at [facility] in [facility]? I know they will call. Thanks.

## 2020-05-18 ENCOUNTER — Encounter: Payer: Self-pay | Admitting: Podiatry

## 2020-05-29 NOTE — Progress Notes (Signed)
  Subjective:  Patient ID: Tanya Peterson, female    DOB: 06-04-1961,  MRN: [id number]  Chief Complaint  Patient presents with  . Post-op Problem    Pt states felt pop in ankle today and has extreme pain.    DOS: 01/13/20 Procedure: Right ankle peroneal tendon repair, ATFL repair  59 y.o. female presents with the above complaint. History confirmed with patient.  States that she stepped down felt a pop in her ankle and had extreme pain.  Denies nausea vomiting fever chills  Objective:  Physical Exam: Calf warm and supple without pain palpation, pain to palpation about the peroneal tendons, no evident snapping on attempted range of motion Incision: healing well, no significant drainage, no dehiscence, no significant erythema  No images are attached to the encounter.  Radiographs: X-ray of the right foot and ankle: No acute fractures or interval changes Assessment:   1. Peroneal tendonitis of right lower extremity   2. Post-operative state     Plan:  Patient was evaluated and treated and all questions answered.  Post-operative State -Wound healing well no dehiscence noted -She does have pain at the peroneals today -At this point I do not think MRI is warranted.  We will continue to monitor -Placed in a posterior splint with follow-up with Dr. Earleen Newport next week  No follow-ups on file.

## 2020-06-10 ENCOUNTER — Ambulatory Visit: Payer: Self-pay | Admitting: Podiatry

## 2020-06-13 ENCOUNTER — Telehealth: Payer: Self-pay | Admitting: Podiatry

## 2020-06-13 ENCOUNTER — Encounter: Payer: Self-pay | Admitting: Podiatry

## 2020-06-13 NOTE — Telephone Encounter (Signed)
Tanya Peterson would like a note, to stay out of work longer, she is still not able to put any weight on her foot. She is scheduled to return to work 8/1. She also said due to her being ill, she was not able to attend PT for 2 weeks, so that's why she needs to extend her leave. Please advise?

## 2020-06-14 NOTE — Telephone Encounter (Signed)
Ok to extend until I see her back. Thanks.

## 2020-06-21 ENCOUNTER — Other Ambulatory Visit: Payer: Self-pay

## 2020-06-21 ENCOUNTER — Ambulatory Visit (INDEPENDENT_AMBULATORY_CARE_PROVIDER_SITE_OTHER): Payer: 59 | Admitting: Podiatry

## 2020-06-21 DIAGNOSIS — S9304XD Dislocation of right ankle joint, subsequent encounter: Secondary | ICD-10-CM

## 2020-06-21 DIAGNOSIS — Z9889 Other specified postprocedural states: Secondary | ICD-10-CM

## 2020-06-21 DIAGNOSIS — IMO0001 Reserved for inherently not codable concepts without codable children: Secondary | ICD-10-CM

## 2020-06-24 NOTE — Progress Notes (Signed)
  Subjective: Tanya Peterson is a 59 y.o. is seen today in office s/p right ankle repair of subluxating peroneal longus tendon preformed on 02/17/2020.  She is back to regular shoe and she is gone back to doing physical therapy however she is also doing physical therapy for her arm and she has seen 2 different therapist.  She is doing this because she went to see a female for her shoulder.  She was not able to do physical therapy for 2 weeks as she had bronchitis.  She states the right side still lacking range of motion. Denies any fevers, chills, nausea, vomiting.  No calf pain, chest pain, shortness of breath.  Objective: General: No acute distress, AAOx3  DP/PT pulses palpable 2/4, CRT < 3 sec to all digits.  Protective sensation intact. Motor function intact.  Right ankle: Incision is well coapted without any evidence of dehiscence and scar is formed.  There are some mild tenderness palpation at surgical site there is decreased range of motion on inversion, eversion on the right side.  She also been some stiffness in the left ankle as well range of motion.  Intact.  On the right side there is mild edema but there is no erythema or warmth.  No area of pinpoint tenderness.  Upon reading the peroneal tendons the range of motion there is no significant subluxation identified. No other open lesions or pre-ulcerative lesions.  No pain with calf compression, swelling, warmth, erythema.    Assessment and Plan:  Status post right ankle repair subluxating peroneal tendon; left subtalar joint capsulitis   -Treatment options discussed including all alternatives, risks, and complications -At this point she has been improving but she needs to continue with physical therapy to work on bilateral lower extremities.  She is currently seeing treatment therapist 1 for her shoulder and one for her foot and she denies any difficulty with insurance and seems.  Recommend her seeing one physical therapist and she would  prefer a female has a wart on her shoulder. -Continue home range of motion exercises. -She is not able to return to work I think she will be able to return to work on 08/01/2020 from my standpoint but may be longer due to her shoulder.   Tanya Peterson DPM

## 2020-06-27 ENCOUNTER — Encounter: Payer: Self-pay | Admitting: Podiatry

## 2020-07-22 ENCOUNTER — Encounter: Payer: Self-pay | Admitting: Podiatry

## 2020-07-22 ENCOUNTER — Other Ambulatory Visit: Payer: Self-pay

## 2020-07-22 ENCOUNTER — Ambulatory Visit (INDEPENDENT_AMBULATORY_CARE_PROVIDER_SITE_OTHER): Payer: 59 | Admitting: Podiatry

## 2020-07-22 VITALS — Temp 97.2°F

## 2020-07-22 DIAGNOSIS — S9304XD Dislocation of right ankle joint, subsequent encounter: Secondary | ICD-10-CM

## 2020-07-22 DIAGNOSIS — Z9889 Other specified postprocedural states: Secondary | ICD-10-CM | POA: Diagnosis not present

## 2020-07-22 DIAGNOSIS — IMO0001 Reserved for inherently not codable concepts without codable children: Secondary | ICD-10-CM

## 2020-07-29 ENCOUNTER — Encounter: Payer: Self-pay | Admitting: Podiatry

## 2020-07-29 ENCOUNTER — Telehealth: Payer: Self-pay | Admitting: Podiatry

## 2020-07-29 NOTE — Progress Notes (Signed)
  Subjective: Tanya Peterson is a 59 y.o. is seen today in office s/p right ankle repair of subluxating peroneal longus tendon preformed on 02/17/2020.  She states that she is having some swelling discomfort to bilateral ankles.  She is going to physical therapy weekly.  The plan physical therapy is to work on the feet for the next 6 weeks and then to hold off on the foot and focus on the right arm and then combine therapy together.  Due to the ankle surgery as well as the right arm she is not able to return to work at this time. Denies any fevers, chills, nausea, vomiting.  No calf pain, chest pain, shortness of breath.  Objective: General: No acute distress, AAOx3  DP/PT pulses palpable 2/4, CRT < 3 sec to all digits.  Protective sensation intact. Motor function intact.  Right ankle: Incision is well coapted without any evidence of dehiscence and scar is formed.  There is mild edema on the surgical center right side with minimal discomfort.  There is no ankle instability identified at this time.  Mild discomfort as well in the left ankle on the lateral ankle complex however no specific area of pinpoint tenderness.  No ankle stability in the left side as well.  There is mild edema.  There is no erythema or warmth.  MMT 5/5 except for eversion which is limited on the right side. No other open lesions or pre-ulcerative lesions.  No pain with calf compression, swelling, warmth, erythema.    Assessment and Plan:  Status post right ankle repair subluxating peroneal tendon; left subtalar joint capsulitis  -Treatment options discussed including all alternatives, risks, and complications -At this point she is made continue physical therapy.  She seems to be making some improvement although slowly as she did at the stop therapy for some time given the shoulder surgery.  At this point the ring continue with physical therapy for the ankles and neck 6 weeks before starting dedicated therapy for the right shoulder.   I will keep her out of work for the next 6 weeks for the lower extremities but even at that point she is likely not to return to work given the right arm.  Trula Slade DPM

## 2020-07-29 NOTE — Telephone Encounter (Signed)
done

## 2020-08-26 ENCOUNTER — Ambulatory Visit (INDEPENDENT_AMBULATORY_CARE_PROVIDER_SITE_OTHER): Payer: 59 | Admitting: Podiatry

## 2020-08-26 ENCOUNTER — Other Ambulatory Visit: Payer: Self-pay

## 2020-08-26 DIAGNOSIS — M779 Enthesopathy, unspecified: Secondary | ICD-10-CM | POA: Diagnosis not present

## 2020-08-26 DIAGNOSIS — T148XXA Other injury of unspecified body region, initial encounter: Secondary | ICD-10-CM | POA: Diagnosis not present

## 2020-08-29 NOTE — Progress Notes (Signed)
°  Subjective: Tanya Peterson is a 59 y.o. is seen today in office s/p right ankle repair of subluxating peroneal longus tendon preformed on 02/17/2020.  Since I last saw her she still been doing physical therapy.  She still gets discomfort to both ankles as well as weakness on the right side.  She is doing therapy once a week every other week for her ankle but the majority of the physical therapy seems to focus on her arm.  At this point apparently due to lack of ability in her right arm she is not able to balance or progress with physical therapy in the ankle.  She is currently undergoing a series of injections into the right shoulder for apparent scar tissue. Denies any fevers, chills, nausea, vomiting.  No calf pain, chest pain, shortness of breath.  Objective: General: No acute distress, AAOx3  DP/PT pulses palpable 2/4, CRT < 3 sec to all digits.  Protective sensation intact. Motor function intact.  Right ankle: Incision is well coapted without any evidence of dehiscence and scar is formed.  Minimal edema on the course the peroneal tendons right side worse than left.  There is no erythema or warmth.  Flexor, extensor tendons may be intact.  Decreased eversion on the right side.  MMT 4/5 eversion on the right side otherwise 5/5. LEFT: Mild swelling and edema on the course the peroneal tendon, sinus tarsi left side but overall the tendon appears to be intact there is no evidence of subluxation. No other open lesions or pre-ulcerative lesions.  No pain with calf compression, swelling, warmth, erythema.    Assessment and Plan:  Status post right ankle repair subluxating peroneal tendon  -Treatment options discussed including all alternatives, risks, and complications -She is back to wearing regular shoes however her physical therapy is stalled but she is still doing it once every other week and physical therapy but she still doing exercises at home.  It seems like at this point physical therapy is  focused on the shoulder.  Unfortunate she is not able to return to work for both of her shoulder issues and still lack of ability on the right lower extremity.  Trula Slade DPM

## 2020-09-02 ENCOUNTER — Telehealth: Payer: Self-pay | Admitting: Cardiology

## 2020-09-02 NOTE — Telephone Encounter (Signed)
Spoke to patient she stated she has been having more episodes of fast heart beat.Stated she has noticed her B/P has been running higher than normal.She was told Dr.Crenshaw did not have any openings until January and she would like to see him sooner.She only wants to see Dr.Crenshaw.Appointment scheduled with Dr.Crenshaw 10/26 at 3:00 pm.

## 2020-09-02 NOTE — Telephone Encounter (Signed)
Patient c/o Palpitations:  High priority if patient c/o lightheadedness, shortness of breath, or chest pain  1) How long have you had palpitations/irregular HR/ Afib? Are you having the symptoms now? A month   2) Are you currently experiencing lightheadedness, SOB or CP? No   3) Do you have a history of afib (atrial fibrillation) or irregular heart rhythm? Not sure   4) Have you checked your BP or HR? (document readings if available): not sure  5) Are you experiencing any other symptoms? No patient need sooner apt than 11/2020. Please call patient  Back.

## 2020-09-07 NOTE — Progress Notes (Signed)
HPI: FUpalpitations. Seen in the emergency room June 2017 with palpitations. ECG with sinus tachycardia and nonspecific ST changes. 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 Echocardiogram August 2017 showed normal LV systolic function.Exercise treadmill May 2018 was normal. 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 Since last seenshe denies dyspnea, chest pain or syncope.  She occasionally has palpitations predominant at night.  Current Outpatient Medications  Medication Sig Dispense Refill  . acetaminophen (TYLENOL) 325 MG tablet Take 650 mg by mouth every 6 (six) hours as needed.    Marland Kitchen albuterol (PROVENTIL HFA;VENTOLIN HFA) 108 (90 BASE) MCG/ACT inhaler Inhale 1 puff into the lungs every 30 (thirty) minutes as needed for wheezing or shortness of breath.    . Ascorbic Acid (VITAMIN C) 1000 MG tablet Take 1,000 mg by mouth daily.    Marland Kitchen aspirin EC 81 MG tablet Take 1 tablet (81 mg total) by mouth daily. 30 tablet 1  . Bioflavonoid Products (GRAPE SEED PO) Take by mouth. Grape seed with resveratrol    . CALCIUM PO Take by mouth as directed.    . Cholecalciferol (VITAMIN D3) 1000 units CAPS Take by mouth daily.    . clotrimazole-betamethasone (LOTRISONE) cream Apply topically 2 (two) times daily.    . diclofenac (VOLTAREN) 75 MG EC tablet Take 75 mg by mouth 2 (two) times daily.    . Estradiol (VAGIFEM VA) Place vaginally as directed.    . Estradiol 10 MCG TABS vaginal tablet Place 1 tablet vaginally 2 (two) times a week.    . Ferrous Sulfate (IRON) 325 (65 Fe) MG TABS Take by mouth daily.    Marland Kitchen ibuprofen (ADVIL) 600 MG tablet Take by mouth.    Marland Kitchen KRILL OIL PO Take by mouth as directed.    Marland Kitchen LORazepam (ATIVAN) 0.5 MG tablet     . magnesium oxide (MAG-OX) 400 MG tablet Take 400 mg by mouth daily.    . meloxicam (MOBIC) 15 MG tablet One tab PO qAM with a meal for 2 weeks,  then daily prn pain. 30 tablet 3  . NON FORMULARY C-Medicare scar cream--Use as directed    . NON [facility]  Creams-#14    . ondansetron (ZOFRAN) 4 MG tablet Take 4 mg by mouth every 8 (eight) hours as needed.    . triamcinolone (KENALOG) 0.025 % ointment Apply 1 application topically 2 (two) times daily. 30 g 0  . vitamin B-12 (CYANOCOBALAMIN) 1000 MCG tablet Take 1,000 mcg by mouth daily.     No current facility-administered medications for this visit.     Past Medical History:  Diagnosis Date  . ADHD (attention deficit hyperactivity disorder)   . Asthma    rarely uses inhaler  . Bipolar affective disorder ([place])   . Complication of anesthesia   . Depression   . 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 History of kidney stones   . Panic attacks   . PONV (postoperative nausea and vomiting)   . Sleep disorder    tx with nuvigal    Past Surgical History:  Procedure Laterality Date  . BREAST EXCISIONAL BIOPSY Right 10/02/2017   papilloma  . BREAST LUMPECTOMY WITH RADIOACTIVE SEED LOCALIZATION Right 10/02/2017   Procedure: RIGHT BREAST LUMPECTOMY WITH RADIOACTIVE SEED LOCALIZATION ERAS PATHWAY;  Surgeon: Jovita Kussmaul, MD;  Location: [facility];  Service: General;  Laterality: Right;  . COLONOSCOPY  WITH PROPOFOL N/A 01/11/2015   Procedure: COLONOSCOPY WITH PROPOFOL;  Surgeon: Garlan Fair, MD;  Location: [facility];  Service: Endoscopy;  Laterality: N/A;  . HYSTEROSCOPY WITH D & C N/A 03/05/2016   Procedure: DILATATION AND CURETTAGE /HYSTEROSCOPY;  Surgeon: Vanessa Kick, MD;  Location: [facility];  Service: Gynecology;  Laterality: N/A;  . kdiney stone surgery  12/2015  . LIPOSUCTION    . TENDON REPAIR Left 06/20/2016   foot  . WISDOM TOOTH EXTRACTION      Social History   Socioeconomic History  . Marital status: Single    Spouse name: Not on file  . Number of children: Not on file  . Years of education: Not on file    . Highest education level: Not on file  Occupational History  . Occupation: Freight forwarder at [employer]  . Smoking status: Never Smoker  . Smokeless tobacco: Never Used  Substance and Sexual Activity  . Alcohol use: No  . Drug use: No  . Sexual activity: Yes    Birth control/protection: Post-menopausal  Other Topics Concern  . Not on file  Social History Narrative  . Not on file   Social Determinants of Health   Financial Resource Strain:   . Difficulty of Paying Living Expenses: Not on file  Food Insecurity:   . Worried About Charity fundraiser in the Last Year: Not on file  . Ran Out of Food in the Last Year: Not on file  Transportation Needs:   . Lack of Transportation (Medical): Not on file  . Lack of Transportation (Non-Medical): Not on file  Physical Activity:   . Days of Exercise per Week: Not on file  . Minutes of Exercise per Session: Not on file  Stress:   . Feeling of Stress : Not on file  Social Connections:   . Frequency of Communication with Friends and Family: Not on file  . Frequency of Social Gatherings with Friends and Family: Not on file  . Attends Religious Services: Not on file  . Active Member of Clubs or Organizations: Not on file  . Attends Archivist Meetings: Not on file  . Marital Status: Not on file  Intimate Partner Violence:   . Fear of Current or Ex-Partner: Not on file  . Emotionally Abused: Not on file  . Physically Abused: Not on file  . Sexually Abused: Not on file    Family History  Problem Relation Age of Onset  . Alzheimer's disease Mother   . Alzheimer's disease Father   . Breast cancer Paternal Aunt     ROS: no fevers or chills, productive cough, hemoptysis, dysphasia, odynophagia, melena, hematochezia, dysuria, hematuria, rash, seizure activity, orthopnea, PND, pedal edema, claudication. Remaining systems are negative.  Physical Exam: Well-developed well-nourished in no acute distress.  Skin is warm and  dry.  HEENT is normal.  Neck is supple.  Chest is clear to auscultation with normal expansion.  Cardiovascular exam is regular rate and rhythm.  Abdominal exam nontender or distended. No masses palpated. Extremities show no edema. neuro grossly intact  ECG-normal sinus rhythm at a rate of 84, no ST changes.  Personally reviewed  A/P  1 palpitations-will consider beta-blocker in the future if needed.  She has an apple watch.  She will send me strips of palpitations.  2 chest pain-patient has had no recurrences.  Previous exercise treadmill negative.  We will continue to follow.  3 fibromuscular dysplasia-schedule follow-up carotid Dopplers.  Kirk Ruths,  MD

## 2020-09-13 ENCOUNTER — Encounter: Payer: Self-pay | Admitting: Cardiology

## 2020-09-13 ENCOUNTER — Ambulatory Visit (INDEPENDENT_AMBULATORY_CARE_PROVIDER_SITE_OTHER): Payer: 59 | Admitting: Cardiology

## 2020-09-13 ENCOUNTER — Other Ambulatory Visit: Payer: Self-pay

## 2020-09-13 VITALS — BP 124/72 | HR 84 | Ht 64.0 in | Wt 170.0 lb

## 2020-09-13 DIAGNOSIS — R002 Palpitations: Secondary | ICD-10-CM

## 2020-09-13 DIAGNOSIS — R072 Precordial pain: Secondary | ICD-10-CM

## 2020-09-13 DIAGNOSIS — I773 Arterial fibromuscular dysplasia: Secondary | ICD-10-CM

## 2020-09-13 NOTE — Patient Instructions (Signed)
°  Testing/Procedures:  Your physician has requested that you have a carotid duplex. This test is an ultrasound of the carotid arteries in your neck. It looks at blood flow through these arteries that supply the brain with blood. Allow one hour for this exam. There are no restrictions or special instructions SCHEDULE IN THE [place] OFFICE     Follow-Up: At [facility], you and your health needs are our priority.  As part of our continuing mission to provide you with exceptional heart care, we have created designated Provider Care Teams.  These Care Teams include your primary Cardiologist (physician) and Advanced Practice Providers (APPs -  Physician Assistants and Nurse Practitioners) who all work together to provide you with the care you need, when you need it.  We recommend signing up for the patient portal called "MyChart".  Sign up information is provided on this After Visit Summary.  MyChart is used to connect with patients for Virtual Visits (Telemedicine).  Patients are able to view lab/test results, encounter notes, upcoming appointments, etc.  Non-urgent messages can be sent to your provider as well.   To learn more about what you can do with MyChart, go to [email or web address].    Your next appointment:   12 month(s)  The format for your next appointment:   In Person  Provider:   Kirk Ruths, MD

## 2020-09-23 ENCOUNTER — Other Ambulatory Visit: Payer: Self-pay

## 2020-09-23 ENCOUNTER — Ambulatory Visit (INDEPENDENT_AMBULATORY_CARE_PROVIDER_SITE_OTHER): Payer: 59 | Admitting: Podiatry

## 2020-09-23 DIAGNOSIS — M779 Enthesopathy, unspecified: Secondary | ICD-10-CM

## 2020-09-23 DIAGNOSIS — T148XXA Other injury of unspecified body region, initial encounter: Secondary | ICD-10-CM

## 2020-09-28 NOTE — Progress Notes (Signed)
  Subjective: Tanya Peterson is a 59 y.o. is seen today in office s/p right ankle repair of subluxating peroneal longus tendon preformed on 02/17/2020.  She states that she still feels like she has weakness in her ankles.  She has been doing physical therapy once every 2 weeks for her ankles.  She did recently purchase some equipment but she has been doing some exercises at home on her own as well.  Unfortunately she had to stop some therapy for her foot because of her shoulder surgery.  Unfortunate she is in need to have a second shoulder surgery which is scheduled for November 29.  She is currently out on leave until December 1 which was done by her orthopedic doctor. Denies any fevers, chills, nausea, vomiting.  No calf pain, chest pain, shortness of breath.  Objective: General: No acute distress, AAOx3  DP/PT pulses palpable 2/4, CRT < 3 sec to all digits.  Protective sensation intact. Motor function intact.  Right ankle: Incision is well coapted without any evidence of dehiscence and scar is formed.  There does have some minimal edema bilateral lateral ankles.  There is mild weakness in the eversion but overall she appears to be improved compared to last appointment.  She feels when she walks she has weakness but no  recent injury or trauma.  i left side but overall the tendon appears to be intact there is no evidence of subluxation. No other open lesions or pre-ulcerative lesions.  No pain with calf compression, swelling, warmth, erythema.    Assessment and Plan:  Status post right ankle repair subluxating peroneal tendon; weakness  -Treatment options discussed including all alternatives, risks, and complications -Discussed with her continue with home stretching, rehab exercises and physical therapy as well.  Unfortunately she is out of work still because of her arm.  I do believe that at this point should be back at work from a foot standpoint however she is on medical leave because of the  shoulder.  We will continue to follow her as well and although she can to make progress during this time out of work.  Trula Slade DPM

## 2020-09-30 ENCOUNTER — Other Ambulatory Visit: Payer: Self-pay

## 2020-09-30 ENCOUNTER — Ambulatory Visit (HOSPITAL_BASED_OUTPATIENT_CLINIC_OR_DEPARTMENT_OTHER)
Admission: RE | Admit: 2020-09-30 | Discharge: 2020-09-30 | Disposition: A | Discharge status: Home / Self Care | Payer: 59 | Source: Ambulatory Visit | Attending: Cardiology | Admitting: Cardiology

## 2020-09-30 DIAGNOSIS — I773 Arterial fibromuscular dysplasia: Secondary | ICD-10-CM | POA: Diagnosis present

## 2020-10-25 IMAGING — MR MR FOOT*R* W/O CM
4 of 6 series · 18 of 40 positions shown · non-contrast
Comparison: 11/23/2019 x-ray

CLINICAL DATA: Medial right ankle pain after twisting injury

EXAM:
MRI OF THE RIGHT FOREFOOT WITHOUT CONTRAST
TECHNIQUE: Multiplanar, multisequence MR imaging of the ankle was performed. No
intravenous contrast was administered.

[Series 6: T2 fat-sat · axial · 4.0mm · 0.31mm/px · z∈[-28,+82]mm · 5 of 28 slices shown (1 of 3)]
[im 1/28]
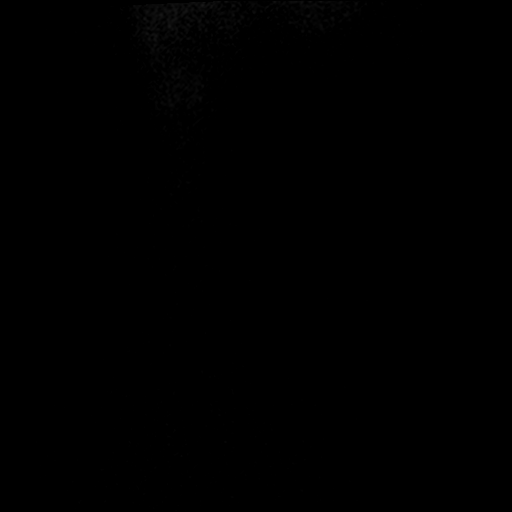
[im 5/28]
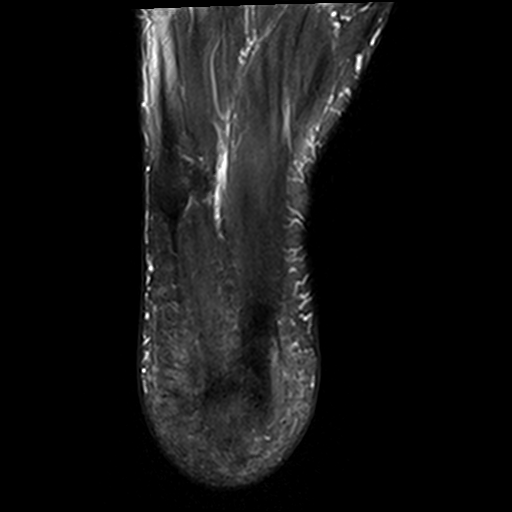
[im 10/28]
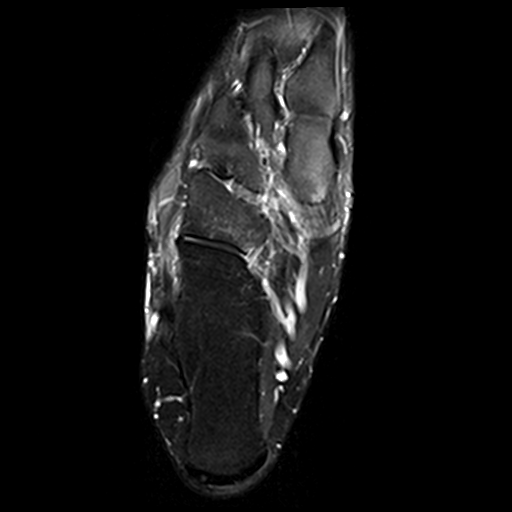
[im 14/28]
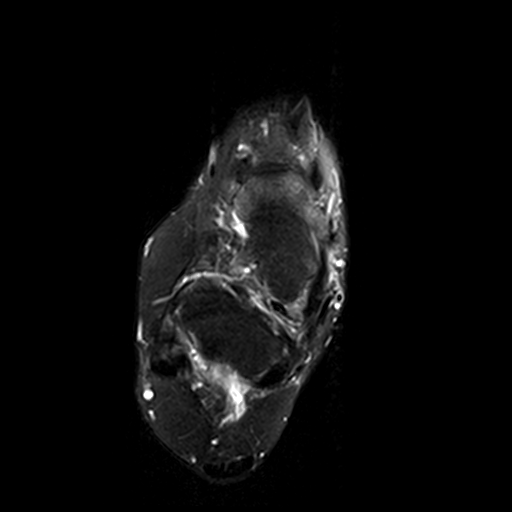
[im 23/28]
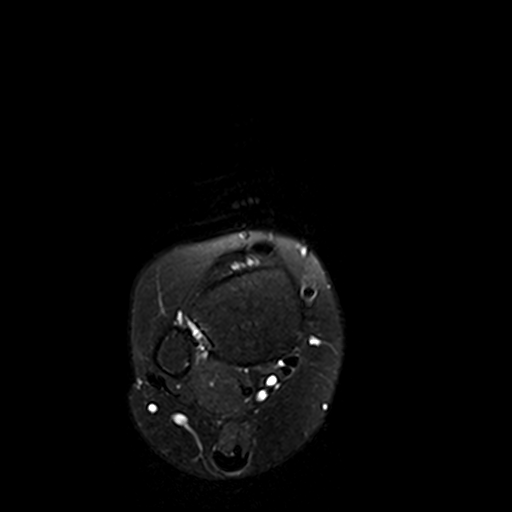

[Series 7: PD fat-sat · axial · 4.0mm · 0.31mm/px · z∈[-28,+107]mm · 7 of 28 slices shown]
[im 1/28]
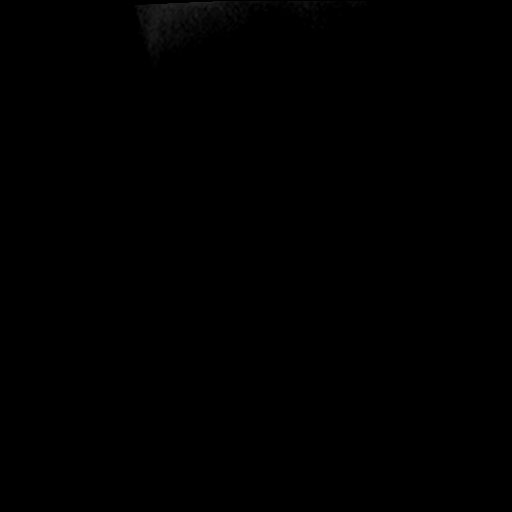
[im 5/28]
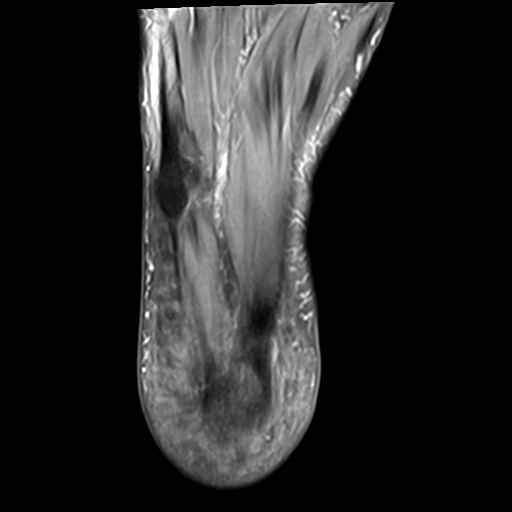
[im 10/28]
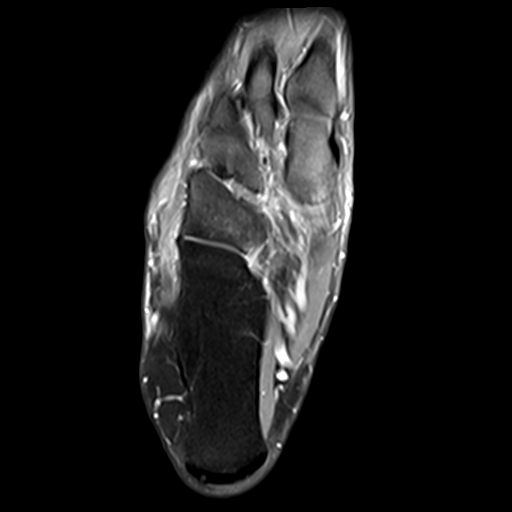
[im 14/28]
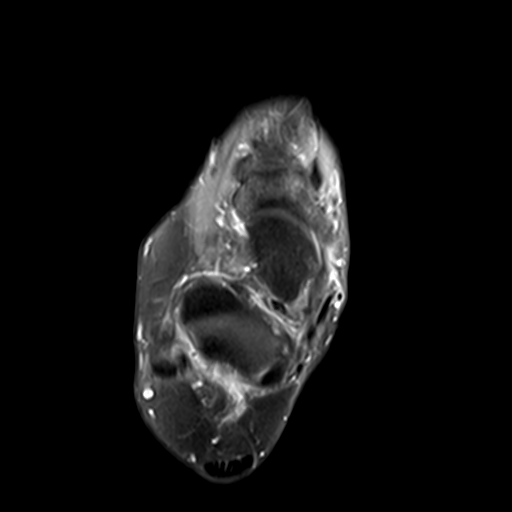
[im 19/28]
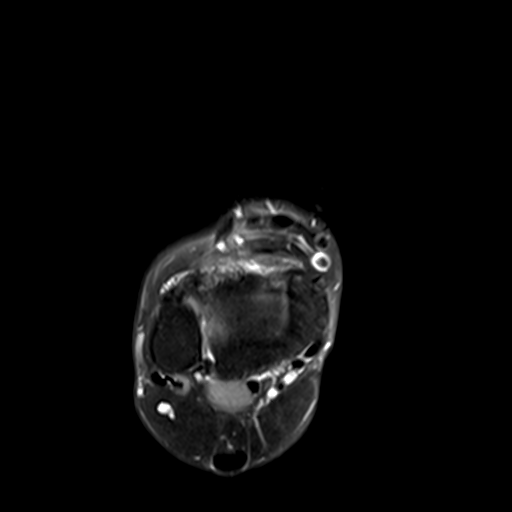
[im 23/28]
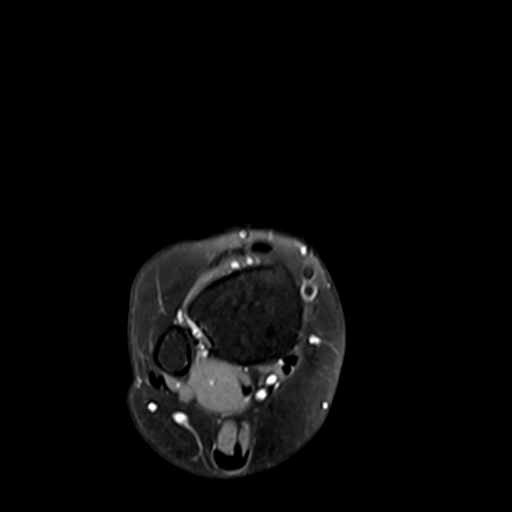
[im 28/28]
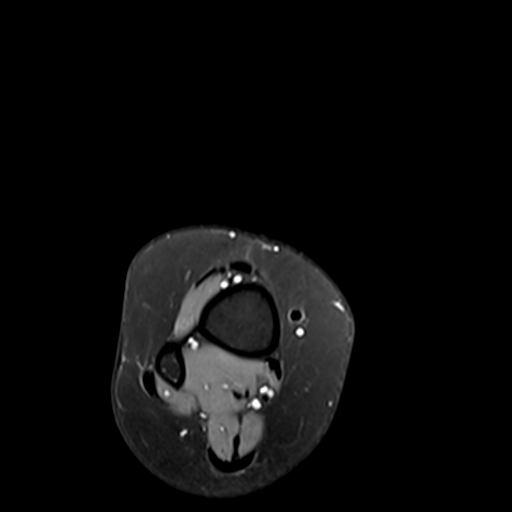

[Series 8: T2 fat-sat · coronal · 4.0mm · 0.31mm/px · 3 of 31 slices shown (2 of 3)]
[im 5/31]
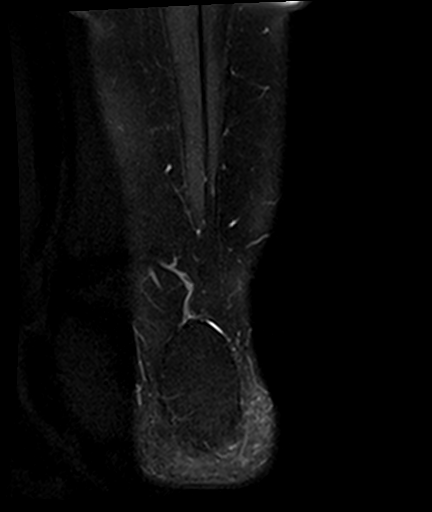
[im 18/31]
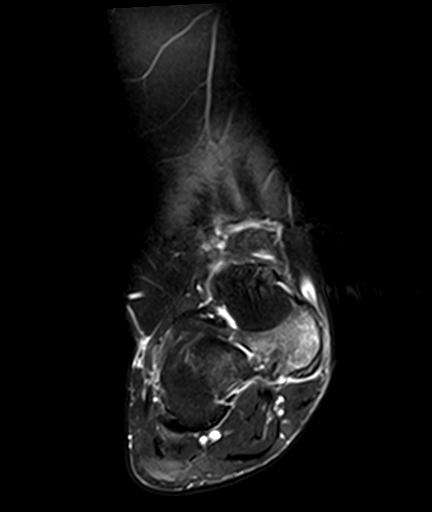
[im 26/31]
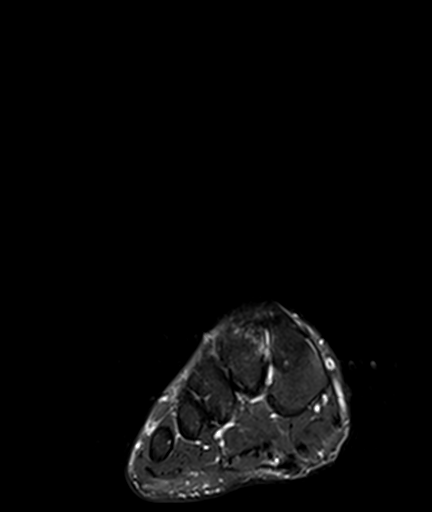

[Series 9: T2 fat-sat · coronal · 4.0mm · 0.31mm/px · 3 of 31 slices shown (3 of 3)]
[im 5/31]
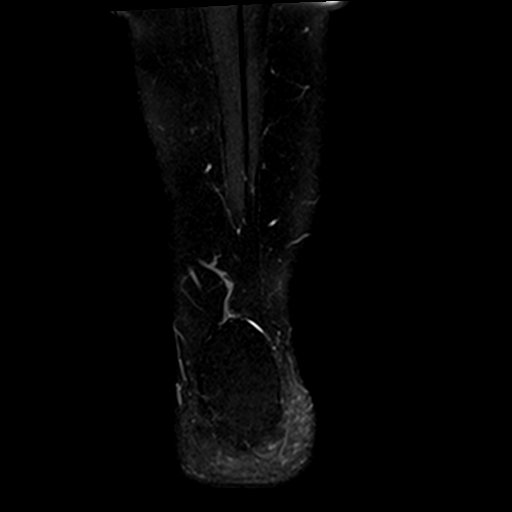
[im 18/31]
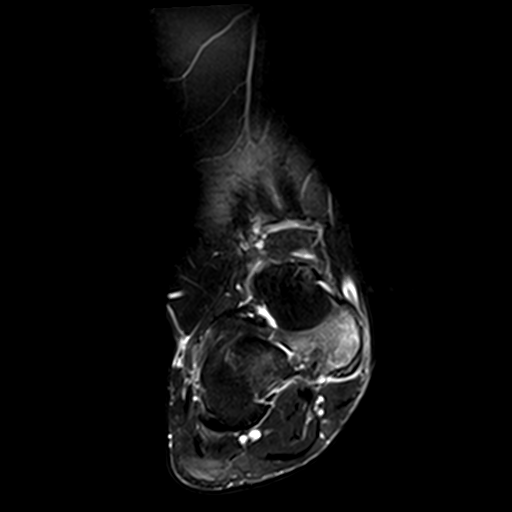
[im 26/31]
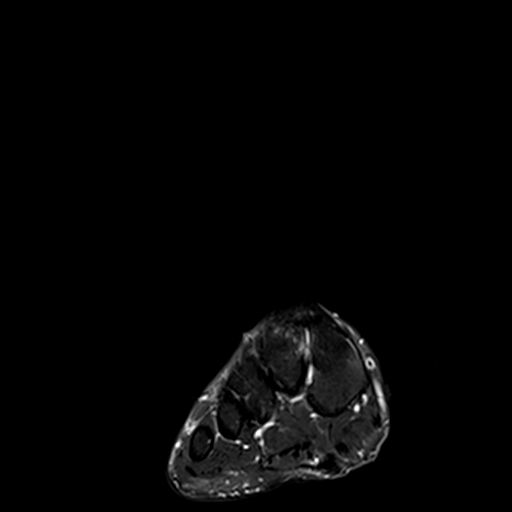

[18 of 40 positions shown; findings below may reference images not displayed]

FINDINGS: TENDONS

Peroneal: Short segment split tear of the peroneus brevis tendon at
the level of the peroneal tubercle (series 6, image 17). Peroneus
longus tendon intact. No tenosynovitis.

Posteromedial: Intact tibialis posterior, flexor hallucis longus and
flexor digitorum longus tendons. There may be mild tendinosis at the
distal insertional portion of the posterior tibialis tendon.

Anterior: Intact tibialis anterior, extensor hallucis longus and
extensor digitorum longus tendons.

Achilles: Intact.

Plantar Fascia: Intact without perifascial edema.

LIGAMENTS

Lateral: Anterior talofibular ligament is torn. Thickened appearance
of the posterior talofibular ligament without complete tear.
Calcaneofibular ligament is not well seen. Anterior and posterior
tibiofibular ligaments are intact.

Medial: Deltoid ligament appears thickened otherwise intact. Spring
ligament appears intact.

CARTILAGE

Ankle Joint: Tiny foci of marrow edema with overlying cartilage
irregularity at the medial and lateral talar shoulders (series 8,
images 12 and 14). Small tibiotalar joint effusion.

Subtalar Joints/Sinus Tarsi: No joint effusion or chondral defect.

Bones: There is extensive bone marrow edema throughout the navicular
bone, particularly within an elongated navicular tuberosity (series
6, image 17). No discrete fracture line. There is mild marrow edema
within the proximal aspect of the medial cuneiform at the
naviculocuneiform articulation.

Mild marrow edema within the medial malleolar tip and lateral
process of the talus (series 9, image 12). There is also focal
marrow edema within the anterior aspect of the lateral malleolar tip
at the level of the ATFL attachment (series 9, image 13).

Other: Intact Lisfranc ligament. No soft tissue fluid collection or
hematoma.
IMPRESSION: 1. Bone marrow edema throughout the navicular bone, particularly
within an elongated navicular tuberosity. No discrete fracture line.
Findings suggestive of bone contusion.
2. Anterior talofibular ligament is torn with associated bone marrow
edema at the fibular attachment. Thickened posterior talofibular
ligament without complete tear.
3. Deltoid ligament sprain with associated marrow edema at the
medial malleolar tip and lateral talus.
4. Short segment split tear of the peroneus brevis tendon at the
level of the peroneal tubercle.

## 2020-10-27 ENCOUNTER — Ambulatory Visit (INDEPENDENT_AMBULATORY_CARE_PROVIDER_SITE_OTHER): Payer: 59 | Admitting: Podiatry

## 2020-10-27 ENCOUNTER — Other Ambulatory Visit: Payer: Self-pay

## 2020-10-27 DIAGNOSIS — T148XXA Other injury of unspecified body region, initial encounter: Secondary | ICD-10-CM | POA: Diagnosis not present

## 2020-10-27 DIAGNOSIS — M7671 Peroneal tendinitis, right leg: Secondary | ICD-10-CM

## 2020-11-01 NOTE — Progress Notes (Signed)
  Subjective: Tanya Peterson is a 59 y.o. is seen today in office s/p right ankle repair of subluxating peroneal longus tendon preformed on 02/17/2020.  Since I last saw her she has undergone right shoulder surgery.  She states that she did not start physical therapy for her shoulder until the week of January 11 until she follows up with her surgeon.  Still has some discomfort to the right ankle but may be doing somewhat better. Denies any fevers, chills, nausea, vomiting.  No calf pain, chest pain, shortness of breath.  Objective: General: No acute distress, AAOx3  DP/PT pulses palpable 2/4, CRT < 3 sec to all digits.  Protective sensation intact. Motor function intact.  Right ankle: Scar is well formed.  Minimal discomfort on the course of the peroneal tendon on the right side.  This is scar tissue present the peroneal tendon inferior to the lateral malleolus towards the fifth metatarsal base.  Still some discomfort with eversion.  Upon range of motion there is no subluxation noted of the peroneal tendon. No other open lesions or pre-ulcerative lesions.  No pain with calf compression, swelling, warmth, erythema.    Assessment and Plan:  Status post right ankle repair subluxating peroneal tendon  -Treatment options discussed including all alternatives, risks, and complications -She may continue with home physical therapy.  She has not been going to full physical therapy since the shoulder surgery.  At this point continue home therapy until she is released to go back to physical therapy for her shoulder.  Continue wearing supportive shoes.  Tanya Peterson DPM

## 2020-12-02 ENCOUNTER — Ambulatory Visit: Payer: 59 | Admitting: Podiatry

## 2020-12-02 ENCOUNTER — Other Ambulatory Visit: Payer: Self-pay

## 2020-12-02 ENCOUNTER — Encounter: Payer: Self-pay | Admitting: Podiatry

## 2020-12-02 DIAGNOSIS — M779 Enthesopathy, unspecified: Secondary | ICD-10-CM | POA: Diagnosis not present

## 2020-12-02 DIAGNOSIS — M79672 Pain in left foot: Secondary | ICD-10-CM

## 2020-12-02 DIAGNOSIS — M79671 Pain in right foot: Secondary | ICD-10-CM

## 2020-12-06 ENCOUNTER — Other Ambulatory Visit: Payer: Self-pay

## 2020-12-06 DIAGNOSIS — M779 Enthesopathy, unspecified: Secondary | ICD-10-CM | POA: Diagnosis not present

## 2020-12-06 DIAGNOSIS — M7671 Peroneal tendinitis, right leg: Secondary | ICD-10-CM

## 2020-12-06 MED ORDER — DEXAMETHASONE SODIUM PHOSPHATE 120 MG/30ML IJ SOLN
4.0000 mg | Freq: Once | INTRAMUSCULAR | Status: AC
  Administered 2020-12-06: 4 mg via INTRA_ARTICULAR

## 2020-12-06 MED ORDER — TRIAMCINOLONE ACETONIDE 10 MG/ML IJ SUSP
10.0000 mg | Freq: Once | INTRAMUSCULAR | Status: AC
  Administered 2020-12-06: 10 mg

## 2020-12-06 NOTE — Progress Notes (Addendum)
  Subjective: Tanya Peterson is a 60 y.o. is seen today in office s/p right ankle repair of subluxating peroneal longus tendon preformed on 02/17/2020.  She is scheduled to start physical therapy for her shoulder next week.  She is requesting injections to bilateral feet today.  She has been using cream on the scar tissue on the right side which is been helping some.  She still in some discomfort with eversion.  Denies any fevers, chills, nausea, vomiting.  No calf pain, chest pain, shortness of breath.  Objective: General: No acute distress, AAOx3  DP/PT pulses palpable 2/4, CRT < 3 sec to all digits.  Protective sensation intact. Motor function intact.  Right ankle: Scar is well formed.  There is scar tissue present along the peroneal tendon on the right side but does appear to be somewhat improved in the last appointment.  It appears the majority tenderness in the right side is more on the adhesions with scar tissue.  On the left side the tenderness is on the sinus tarsi.  There is minimal edema on the left side but no significant edema of the right ankle.  Flexor, extensor tendon appears to be intact.  The peroneal tendons appear to be within the peroneal groove. No other open lesions or pre-ulcerative lesions.  No pain with calf compression, swelling, warmth, erythema.    Assessment and Plan:  Status post right ankle repair subluxating peroneal tendon; left foot capsulitis, scar tissue right side  -Treatment options discussed including all alternatives, risks, and complications -Steroid injection performed bilaterally.  On the LEFT side skin was prepped with Betadine and a mixture of 1 cc Kenalog 10, 0.5 cc of Marcaine plain, 0.5 cc of lidocaine plain was infiltrated into the sinus tarsi without complications.  Postinjection care discussed. -On the RIGHT side mixture of half cc dexamethasone phosphate, half cc Marcaine plain was infiltrated subdermally area scar tissue. -Continue physical therapy  and a referral will be placed for [facility] in [place].  Trula Slade DPM

## 2020-12-08 ENCOUNTER — Encounter: Payer: Self-pay | Admitting: Podiatry

## 2020-12-12 ENCOUNTER — Telehealth: Payer: Self-pay | Admitting: Podiatry

## 2020-12-12 NOTE — Telephone Encounter (Signed)
Patient called inquiring about date for follow up appointment, Please Advise

## 2020-12-13 ENCOUNTER — Encounter: Payer: Self-pay | Admitting: Podiatry

## 2020-12-13 NOTE — Telephone Encounter (Signed)
Can you schedule her to come back in about 3 weeks? Thanks!

## 2020-12-13 NOTE — Telephone Encounter (Signed)
Scheduled 2/4 at 845 in [place]. Patient requested location and date, stating 2/4 would make 3 weeks.

## 2020-12-23 ENCOUNTER — Other Ambulatory Visit: Payer: Self-pay

## 2020-12-23 ENCOUNTER — Ambulatory Visit (INDEPENDENT_AMBULATORY_CARE_PROVIDER_SITE_OTHER): Payer: 59 | Admitting: Podiatry

## 2020-12-23 DIAGNOSIS — M25571 Pain in right ankle and joints of right foot: Secondary | ICD-10-CM | POA: Diagnosis not present

## 2020-12-23 DIAGNOSIS — M779 Enthesopathy, unspecified: Secondary | ICD-10-CM | POA: Diagnosis not present

## 2020-12-23 DIAGNOSIS — M7671 Peroneal tendinitis, right leg: Secondary | ICD-10-CM

## 2020-12-23 DIAGNOSIS — G8929 Other chronic pain: Secondary | ICD-10-CM

## 2020-12-23 DIAGNOSIS — M25572 Pain in left ankle and joints of left foot: Secondary | ICD-10-CM

## 2020-12-26 ENCOUNTER — Other Ambulatory Visit: Payer: Self-pay | Admitting: Family Medicine

## 2020-12-26 DIAGNOSIS — R5381 Other malaise: Secondary | ICD-10-CM

## 2020-12-27 ENCOUNTER — Other Ambulatory Visit: Payer: Self-pay | Admitting: Family Medicine

## 2020-12-27 DIAGNOSIS — R221 Localized swelling, mass and lump, neck: Secondary | ICD-10-CM

## 2020-12-27 DIAGNOSIS — N6325 Unspecified lump in the left breast, overlapping quadrants: Secondary | ICD-10-CM

## 2020-12-27 NOTE — Progress Notes (Signed)
  Subjective: Tanya Peterson is a 60 y.o. is seen today in office s/p right ankle repair of subluxating peroneal longus tendon preformed on 02/17/2020. After last appointment she had injections bilaterally. She does feel that did help. She is noted some increased range of motion but still in discomfort. She states that she try to walk around [employer] shopping and had discomfort while doing this and does not feel that she is ready to back to work for her feet. She is also started work for her shoulder. She has been doing physical therapy as well with [facility] in [place]. Denies any fevers, chills, nausea, vomiting.  No calf pain, chest pain, shortness of breath.  Objective: General: No acute distress, AAOx3  DP/PT pulses palpable 2/4, CRT < 3 sec to all digits.  Protective sensation intact. Motor function intact.  Right ankle: Scar is well formed. There is improved range of motion today particularly eversion of the right side but still restriction. Scar tissue still evident on the prior surgery but does appear to be somewhat improved. To previous since the injection. There is not appear to be any subluxation of the peroneal tendon. No other significant areas of discomfort. Left ankle: In the course of sinus tarsi and minimal edema there is no erythema or warmth. Flexor, extensor tendons appear intact bilaterally.  MMT 5/5 except for the eversion of the right side which is 4/5. No pain with calf compression, swelling, warmth, erythema.    Assessment and Plan:  Status post right ankle repair subluxating peroneal tendon; left foot capsulitis, scar tissue right side  -Treatment options discussed including all alternatives, risks, and complications -At this point want her to continue physical therapy. I am also going to schedule her to come to the office for EPAT particular on the right side peroneal tendon to see if this can help with the scar tissue and also improve range of motion/mobility. Continue  with supportive shoes for now. We held off another steroid injection today. -She is not yet able to return to work for her shoulder but also would like to continue physical therapy for now for her ankles.  Trula Slade DPM

## 2021-01-09 ENCOUNTER — Ambulatory Visit
Admission: RE | Admit: 2021-01-09 | Discharge: 2021-01-09 | Disposition: A | Discharge status: Home / Self Care | Payer: 59 | Source: Ambulatory Visit | Attending: Family Medicine | Admitting: Family Medicine

## 2021-01-09 ENCOUNTER — Other Ambulatory Visit: Payer: Self-pay

## 2021-01-09 ENCOUNTER — Ambulatory Visit (INDEPENDENT_AMBULATORY_CARE_PROVIDER_SITE_OTHER): Payer: 59

## 2021-01-09 ENCOUNTER — Ambulatory Visit (INDEPENDENT_AMBULATORY_CARE_PROVIDER_SITE_OTHER): Payer: 59 | Admitting: Podiatry

## 2021-01-09 DIAGNOSIS — R221 Localized swelling, mass and lump, neck: Secondary | ICD-10-CM

## 2021-01-09 DIAGNOSIS — M25572 Pain in left ankle and joints of left foot: Secondary | ICD-10-CM

## 2021-01-09 DIAGNOSIS — M7671 Peroneal tendinitis, right leg: Secondary | ICD-10-CM

## 2021-01-09 DIAGNOSIS — M779 Enthesopathy, unspecified: Secondary | ICD-10-CM

## 2021-01-09 DIAGNOSIS — M25571 Pain in right ankle and joints of right foot: Secondary | ICD-10-CM

## 2021-01-09 DIAGNOSIS — G8929 Other chronic pain: Secondary | ICD-10-CM

## 2021-01-09 DIAGNOSIS — B351 Tinea unguium: Secondary | ICD-10-CM

## 2021-01-09 NOTE — Progress Notes (Signed)
Patient presents for the 1st EPAT treatment today with complaint of lateral ankle/ heel pain. Diagnosed with peroneal tendonitis  by Dr. Jacqualyn Posey. This has been ongoing for several months. The patient has tried ice, stretching, NSAIDS and supportive shoe gear with no long term relief.   Most of the pain is located Right foot lateral ankle .  ESWT administered and tolerated well.Treatment settings initiated at:   Energy: 15 Ended treatment session today with 3000 shocks at the following settings:   Energy: 15  Frequency: 6.0  Joules: 14.72   Reviewed post EPAT instructions. Advised to avoid ice and NSAIDs throughout the treatment process and to utilize boot or supportive shoes for at least the next 3 days.  Follow up for EPAT #2 treatment in 1 week.

## 2021-01-09 NOTE — Patient Instructions (Signed)

## 2021-01-10 ENCOUNTER — Other Ambulatory Visit: Payer: Self-pay | Admitting: Family Medicine

## 2021-01-10 DIAGNOSIS — R590 Localized enlarged lymph nodes: Secondary | ICD-10-CM

## 2021-01-11 ENCOUNTER — Telehealth: Payer: Self-pay | Admitting: Sports Medicine

## 2021-01-11 NOTE — Telephone Encounter (Signed)
Tanya Peterson has been seen by Dr. Griffin Basil, she has fairly refractory shoulder pain, he called me and asked if we could do a high-volume hydrodistention of her left glenohumeral joint as well as right acromioclavicular joint injection.  She will be scheduled.

## 2021-01-13 ENCOUNTER — Other Ambulatory Visit: Payer: Self-pay

## 2021-01-13 ENCOUNTER — Ambulatory Visit (INDEPENDENT_AMBULATORY_CARE_PROVIDER_SITE_OTHER): Payer: 59

## 2021-01-13 ENCOUNTER — Ambulatory Visit (INDEPENDENT_AMBULATORY_CARE_PROVIDER_SITE_OTHER): Payer: 59 | Admitting: Sports Medicine

## 2021-01-13 DIAGNOSIS — M7501 Adhesive capsulitis of right shoulder: Secondary | ICD-10-CM

## 2021-01-13 DIAGNOSIS — M7542 Impingement syndrome of left shoulder: Secondary | ICD-10-CM

## 2021-01-13 MED ORDER — HYDROCODONE-ACETAMINOPHEN 5-325 MG PO TABS: 1.0000 | ORAL_TABLET | Freq: Three times a day (TID) | ORAL | 0 refills | Status: DC | PRN

## 2021-01-13 NOTE — Addendum Note (Signed)
Addended by: Silverio Decamp on: 01/13/2021 05:01 PM   Modules accepted: Orders

## 2021-01-13 NOTE — Progress Notes (Addendum)
    Procedures performed today:    Procedure: Real-time Ultrasound Guided injection/high-volume hydrodistention of the right glenohumeral joint Device: Samsung HS60  Verbal informed consent obtained.  Time-out conducted.  Noted no overlying erythema, induration, or other signs of local infection.  Skin prepped in a sterile fashion.  Local anesthesia: Topical Ethyl chloride.  With sterile technique and under real time ultrasound guidance:  Normal-appearing glenohumeral joint, 1 cc Kenalog 40, 2 cc lidocaine, 2 cc bupivacaine injected easily I then injected an additional 40 mL of saline to Hydro distend the joint capsule. Completed without difficulty however patient did vagal. Advised to call if fevers/chills, erythema, induration, drainage, or persistent bleeding.  Images permanently stored and available for review in PACS.  Impression: Technically successful ultrasound guided injection.  Procedure: Real-time Ultrasound Guided injection of the left subacromial bursa Device: Samsung HS60  Verbal informed consent obtained.  Time-out conducted.  Noted no overlying erythema, induration, or other signs of local infection.  Skin prepped in a sterile fashion.  Local anesthesia: Topical Ethyl chloride.  With sterile technique and under real time ultrasound guidance:  Noted mild bursitis, 1 cc Kenalog 40, 1 cc lidocaine, 1 cc bupivacaine injected easily Completed without difficulty  Advised to call if fevers/chills, erythema, induration, drainage, or persistent bleeding.  Images permanently stored and available for review in PACS.  Impression: Technically successful ultrasound guided injection.  Independent interpretation of notes and tests performed by another provider:   None.  Brief History, Exam, Impression, and Recommendations:    Impingement syndrome, shoulder, left Classic impingement signs and symptoms, pain not referable to the acromioclavicular joint. Positive Neer's, Hawkins,  empty can signs and pain with abduction. Subacromial injection performed today. May return to either Dr. Griffin Basil or myself for this.  Adhesive capsulitis of right shoulder, rotator cuff repair post re-tear This is a pleasant 60 year old female, she has had quite the poor luck with her right shoulder. We treated her aggressively, ultimately she saw Dr. Griffin Basil, she needed a rotator cuff repair, unfortunately she tore the repair. She is now left with significant pain, and loss of motion. Dr. Griffin Basil and I discussed her case, he referred her to me for high-volume hydrodistention before consideration of arthroscopic lysis of adhesions. Hydrodistention performed today, hydrocodone for postprocedural pain. Return to see either myself or Dr. Griffin Basil in approximately 4 to 6 weeks.    ___________________________________________ Gwen Her. Dianah Field, M.D., ABFM., CAQSM. Primary Care and [place] [employer] [facility]

## 2021-01-13 NOTE — Progress Notes (Signed)
  Subjective: Tanya Peterson is a 60 y.o. is seen today in office s/p right ankle repair of subluxating peroneal longus tendon preformed on 02/17/2020.  She presents today for evaluation and see about possibly when she can return to work.  She is currently not able to return to work for continued discomfort to her ankles on both sides and she is still doing physical therapy as well as for her shoulder.  No recent injury or falls otherwise and she has no new concerns today.  Also scheduled to start  EPAT today.   Denies any fevers, chills, nausea, vomiting.  No calf pain, chest pain, shortness of breath.  Objective: General: No acute distress, AAOx3  DP/PT pulses palpable 2/4, CRT < 3 sec to all digits.  Protective sensation intact. Motor function intact.  Right ankle: Scar is well formed.  Range of motion somewhat improved but still discomfort with eversion particularly right side.  Scar tissue still evident.  There is no erythema or warmth.  No area pinpoint tenderness. Left ankle: In the course of sinus tarsi and minimal edema there is no erythema or warmth. Flexor, extensor tendons appear intact bilaterally.  MMT 5/5 except for the eversion of the right side which is 4/5. No pain with calf compression, swelling, warmth, erythema.    Assessment and Plan:  Status post right ankle repair subluxating peroneal tendon; left foot capsulitis, scar tissue right side  -Treatment options discussed including all alternatives, risks, and complications -Today's visit focused on possibly returning to work and restrictions.  After discussion with her in detail right foot standpoint medically returned to work on February 20, 2021.  I would like for her to do the EPAT treatments as well as continue with physical therapy.  Trula Slade DPM

## 2021-01-13 NOTE — Assessment & Plan Note (Addendum)
This is a pleasant 60 year old female, she has had quite the poor luck with her right shoulder. We treated her aggressively, ultimately she saw Dr. Griffin Basil, she needed a rotator cuff repair, unfortunately she tore the repair. She is now left with significant pain, and loss of motion. Dr. Griffin Basil and I discussed her case, he referred her to me for high-volume hydrodistention before consideration of arthroscopic lysis of adhesions. Hydrodistention performed today, hydrocodone for postprocedural pain. Return to see either myself or Dr. Griffin Basil in approximately 4 to 6 weeks.

## 2021-01-13 NOTE — Assessment & Plan Note (Signed)
Classic impingement signs and symptoms, pain not referable to the acromioclavicular joint. Positive Neer's, Hawkins, empty can signs and pain with abduction. Subacromial injection performed today. May return to either Dr. Griffin Basil or myself for this.

## 2021-01-16 ENCOUNTER — Other Ambulatory Visit: Payer: Self-pay

## 2021-01-16 ENCOUNTER — Ambulatory Visit: Payer: 59

## 2021-01-16 DIAGNOSIS — B351 Tinea unguium: Secondary | ICD-10-CM

## 2021-01-16 DIAGNOSIS — M7671 Peroneal tendinitis, right leg: Secondary | ICD-10-CM

## 2021-01-16 NOTE — Progress Notes (Signed)
Patient presents for the 2nd EPAT treatment today with complaint of lateral ankle/ heel pain. Diagnosed with peroneal tendonitis  by Dr. Jacqualyn Posey. This has been ongoing for several months. The patient has tried ice, stretching, NSAIDS and supportive shoe gear with no long term relief.   Most of the pain is located Right foot lateral ankle .  ESWT administered and tolerated well.Treatment settings initiated at:   Energy: 20 Ended treatment session today with 3000 shocks at the following settings:   Energy: 15  Frequency: 6.0  Joules: 15.90  1st 1000 shocks was at 20 energy  Last 2000 shocks was at 15 energy    Reviewed post EPAT instructions. Advised to avoid ice and NSAIDs throughout the treatment process and to utilize boot or supportive shoes for at least the next 3 days.  Follow up for EPAT #3 treatment in 1 week.

## 2021-01-20 ENCOUNTER — Other Ambulatory Visit: Payer: 59

## 2021-01-23 ENCOUNTER — Other Ambulatory Visit: Payer: Self-pay

## 2021-01-23 ENCOUNTER — Other Ambulatory Visit: Payer: 59

## 2021-01-23 ENCOUNTER — Ambulatory Visit (INDEPENDENT_AMBULATORY_CARE_PROVIDER_SITE_OTHER): Payer: 59

## 2021-01-23 DIAGNOSIS — M7671 Peroneal tendinitis, right leg: Secondary | ICD-10-CM

## 2021-01-23 NOTE — Progress Notes (Signed)
Patient presents for the 3rd EPAT treatment of right foot and 1st EPAT treatment of the left foot today with complaint of lateral ankle/ heel pain. Diagnosed with peroneal tendonitis  by Dr. Jacqualyn Posey. This has been ongoing for several months. The patient has tried ice, stretching, NSAIDS and supportive shoe gear with no long term relief.   Most of the pain is located bilateral foot lateral ankle .  ESWT administered and tolerated well.Treatment settings initiated at:   Energy: 15(Right) 10(left) Ended treatment session today with 3000 shocks at the following settings:   Energy: 15 (right)    10 (left)  Frequency: 6.0 (right)    6.0(left)  Joules: 14.72 (right) 10.24    Reviewed post EPAT instructions. Advised to avoid ice and NSAIDs throughout the treatment process and to utilize boot or supportive shoes for at least the next 3 days.  Follow up for EPAT #4 (right) and #2 (left) treatment in 2 weeks .

## 2021-01-25 DIAGNOSIS — B351 Tinea unguium: Secondary | ICD-10-CM

## 2021-01-26 ENCOUNTER — Ambulatory Visit
Admission: RE | Admit: 2021-01-26 | Discharge: 2021-01-26 | Disposition: A | Discharge status: Home / Self Care | Payer: 59 | Source: Ambulatory Visit | Attending: Family Medicine | Admitting: Family Medicine

## 2021-01-26 ENCOUNTER — Other Ambulatory Visit: Payer: Self-pay

## 2021-01-26 DIAGNOSIS — R590 Localized enlarged lymph nodes: Secondary | ICD-10-CM

## 2021-01-26 MED ORDER — IOPAMIDOL (ISOVUE-300) INJECTION 61%
75.0000 mL | Freq: Once | INTRAVENOUS | Status: AC | PRN
  Administered 2021-01-26: 75 mL via INTRAVENOUS

## 2021-01-27 DIAGNOSIS — Z85820 Personal history of malignant melanoma of skin: Secondary | ICD-10-CM | POA: Insufficient documentation

## 2021-01-30 ENCOUNTER — Ambulatory Visit (INDEPENDENT_AMBULATORY_CARE_PROVIDER_SITE_OTHER): Payer: 59

## 2021-01-30 ENCOUNTER — Other Ambulatory Visit: Payer: Self-pay

## 2021-01-30 DIAGNOSIS — M7671 Peroneal tendinitis, right leg: Secondary | ICD-10-CM

## 2021-01-30 NOTE — Progress Notes (Signed)
Patient presents for the 4th EPAT treatment of right foot and 2nd EPAT treatment of the left foot today with complaint of lateral ankle/ heel pain. Diagnosed with peroneal tendonitis  by Dr. Jacqualyn Posey. This has been ongoing for several months. The patient has tried ice, stretching, NSAIDS and supportive shoe gear with no long term relief.   Most of the pain is located bilateral foot lateral ankle .  ESWT administered and tolerated well.Treatment settings initiated at:   Energy: 15(Right) 12(left) Ended treatment session today with 3000 shocks at the following settings:   Energy: 15 (right)    12(left)  Frequency: 6.0 (right)    6.0(left)  Joules: 14.72 (right) 11.77   Reviewed post EPAT instructions. Advised to avoid ice and NSAIDs throughout the treatment process and to utilize boot or supportive shoes for at least the next 3 days.  Follow up for EPAT #3 left foot. Right foot treatment completed at this time.

## 2021-02-03 ENCOUNTER — Other Ambulatory Visit: Payer: 59

## 2021-02-06 ENCOUNTER — Other Ambulatory Visit: Payer: Self-pay

## 2021-02-06 ENCOUNTER — Ambulatory Visit (INDEPENDENT_AMBULATORY_CARE_PROVIDER_SITE_OTHER): Payer: 59

## 2021-02-06 ENCOUNTER — Other Ambulatory Visit: Payer: 59

## 2021-02-06 DIAGNOSIS — M779 Enthesopathy, unspecified: Secondary | ICD-10-CM

## 2021-02-06 DIAGNOSIS — B351 Tinea unguium: Secondary | ICD-10-CM

## 2021-02-06 NOTE — Progress Notes (Signed)
Patient presents for the 3rd EPAT treatment of the left foot today with complaint of lateral ankle/ heel pain. Diagnosed with peroneal tendonitis  by Dr. Jacqualyn Posey. This has been ongoing for several months. The patient has tried ice, stretching, NSAIDS and supportive shoe gear with no long term relief.   Most of the pain is located bilateral foot lateral ankle .  ESWT administered and tolerated well.Treatment settings initiated at:   Energy: 15 Ended treatment session today with 3000 shocks at the following settings:   Energy: 15  Frequency: 6.0   Joules: 14.72    Reviewed post EPAT instructions. Advised to avoid ice and NSAIDs throughout the treatment process and to utilize boot or supportive shoes for at least the next 3 days.  Follow up for EPAT #4 left foot. Right foot treatment completed at this time.

## 2021-02-13 ENCOUNTER — Other Ambulatory Visit: Payer: 59

## 2021-02-13 ENCOUNTER — Other Ambulatory Visit: Payer: Self-pay

## 2021-02-13 ENCOUNTER — Ambulatory Visit: Payer: 59 | Admitting: Sports Medicine

## 2021-02-13 ENCOUNTER — Ambulatory Visit (INDEPENDENT_AMBULATORY_CARE_PROVIDER_SITE_OTHER): Payer: 59 | Admitting: Podiatry

## 2021-02-13 ENCOUNTER — Ambulatory Visit (INDEPENDENT_AMBULATORY_CARE_PROVIDER_SITE_OTHER): Payer: 59

## 2021-02-13 DIAGNOSIS — G8929 Other chronic pain: Secondary | ICD-10-CM | POA: Diagnosis not present

## 2021-02-13 DIAGNOSIS — M7542 Impingement syndrome of left shoulder: Secondary | ICD-10-CM

## 2021-02-13 DIAGNOSIS — M25571 Pain in right ankle and joints of right foot: Secondary | ICD-10-CM

## 2021-02-13 DIAGNOSIS — M7671 Peroneal tendinitis, right leg: Secondary | ICD-10-CM | POA: Diagnosis not present

## 2021-02-13 DIAGNOSIS — M7501 Adhesive capsulitis of right shoulder: Secondary | ICD-10-CM | POA: Diagnosis not present

## 2021-02-13 DIAGNOSIS — M779 Enthesopathy, unspecified: Secondary | ICD-10-CM

## 2021-02-13 DIAGNOSIS — M25572 Pain in left ankle and joints of left foot: Secondary | ICD-10-CM

## 2021-02-13 DIAGNOSIS — B351 Tinea unguium: Secondary | ICD-10-CM

## 2021-02-13 NOTE — Progress Notes (Signed)
  Subjective: Tanya Peterson is a 60 y.o. is seen today in office s/p right ankle repair of subluxating peroneal longus tendon preformed on 02/17/2020.  Since I last saw her she states that she has been placed on indefinitely from her job.  There is also been issues with her insurance getting physical therapy approved.  She does state that on Thursday or Friday she had a "catch" in one of her feet that she is not sure of which foot it was.  Over the weekend she states that she started to develop this on the left side on Saturday and Sunday.  No swelling.  No recent or falls.  She points on the medial aspect of the foot and ankle where she had the discomfort.  She also presents today for her last treatment of EPAT on her left side.  Denies any fevers, chills, nausea, vomiting.  No calf pain, chest pain, shortness of breath.  Objective: General: No acute distress, AAOx3  DP/PT pulses palpable 2/4, CRT < 3 sec to all digits.  Protective sensation intact. Motor function intact.  Lateral ankle scars are well formed.  Her strength appears to be much improved particularly in inversion on the peroneal tendons.  The area of the "catching" sensation appears to be along the tibialis anterior insertion bilaterally.  The tendon appears to be intact there is no edema, erythema.  Strength is adequate.  No areas of pinpoint tenderness.  MMT 5/5. No pain with calf compression, swelling, warmth, erythema.    Assessment and Plan:  Status post right ankle repair subluxating peroneal tendon; left foot capsulitis, scar tissue right side  -Treatment options discussed including all alternatives, risks, and complications -She is continue to make progress.  Hopefully now the insurance has been set up with physical therapy she can continue with this.   -Last EPAT today on the left -Discussed repeat steroid injection on the right side if needed but overall that was very helpful as well.  Trula Slade DPM

## 2021-02-13 NOTE — Progress Notes (Signed)
Patient presents for the 4th EPAT treatment of the left foot today with complaint of lateral ankle/ heel pain. Diagnosed with peroneal tendonitis  by Dr. Jacqualyn Posey. This has been ongoing for several months. The patient has tried ice, stretching, NSAIDS and supportive shoe gear with no long term relief.   Most of the pain is located bilateral foot lateral ankle .  ESWT administered and tolerated well.Treatment settings initiated at:   Energy: 15 Ended treatment session today with 3000 shocks at the following settings:   Energy: 15  Frequency: 6.0   Joules: 14.72    Reviewed post EPAT instructions. Advised to avoid ice and NSAIDs throughout the treatment process and to utilize boot or supportive shoes for at least the next 3 days.  EPAT treatment completed bilaterally. Follow-up with Dr. Jacqualyn Posey in 4-6 weeks.

## 2021-02-13 NOTE — Assessment & Plan Note (Signed)
Tanya Peterson returns, she saw Dr. Griffin Basil, had a rotator cuff repair, unfortunately had a retear with repeat repair with cadaver tissue. She has significant loss of motion, I performed a high-volume hydrodistention at the last visit and she has gotten some motion back, she still has significant weakness to abduction and does have another month of physical therapy scheduled. She can return to see Dr. Griffin Basil, I think she will do well with a month and a half or so if additional physical therapy poor considering something like arthroscopic lysis of adhesions or reverse total shoulder arthroplasty.

## 2021-02-13 NOTE — Progress Notes (Signed)
    Procedures performed today:    None.  Independent interpretation of notes and tests performed by another provider:   None.  Brief History, Exam, Impression, and Recommendations:    Impingement syndrome, shoulder, left Left shoulder is doing really well after subacromial injection at the last visit. No further intervention needed.  Adhesive capsulitis of right shoulder, rotator cuff repair post re-tear Tanya Peterson returns, she saw Dr. Griffin Basil, had a rotator cuff repair, unfortunately had a retear with repeat repair with cadaver tissue. She has significant loss of motion, I performed a high-volume hydrodistention at the last visit and she has gotten some motion back, she still has significant weakness to abduction and does have another month of physical therapy scheduled. She can return to see Dr. Griffin Basil, I think she will do well with a month and a half or so if additional physical therapy poor considering something like arthroscopic lysis of adhesions or reverse total shoulder arthroplasty.    ___________________________________________ Gwen Her. Dianah Field, M.D., ABFM., CAQSM. Primary Care and [place] [employer] [place] [facility]

## 2021-02-13 NOTE — Assessment & Plan Note (Signed)
Left shoulder is doing really well after subacromial injection at the last visit. No further intervention needed.

## 2021-02-15 ENCOUNTER — Ambulatory Visit
Admission: RE | Admit: 2021-02-15 | Discharge: 2021-02-15 | Disposition: A | Discharge status: Home / Self Care | Payer: 59 | Source: Ambulatory Visit | Attending: Family Medicine | Admitting: Family Medicine

## 2021-02-15 ENCOUNTER — Other Ambulatory Visit: Payer: Self-pay

## 2021-02-15 DIAGNOSIS — N6325 Unspecified lump in the left breast, overlapping quadrants: Secondary | ICD-10-CM

## 2021-02-17 ENCOUNTER — Other Ambulatory Visit: Payer: 59

## 2021-03-20 ENCOUNTER — Ambulatory Visit (INDEPENDENT_AMBULATORY_CARE_PROVIDER_SITE_OTHER): Payer: 59

## 2021-03-20 ENCOUNTER — Ambulatory Visit: Payer: 59 | Admitting: Podiatry

## 2021-03-20 ENCOUNTER — Other Ambulatory Visit: Payer: Self-pay

## 2021-03-20 ENCOUNTER — Encounter: Payer: Self-pay | Admitting: Podiatry

## 2021-03-20 DIAGNOSIS — M7671 Peroneal tendinitis, right leg: Secondary | ICD-10-CM | POA: Diagnosis not present

## 2021-03-20 DIAGNOSIS — W57XXXA Bitten or stung by nonvenomous insect and other nonvenomous arthropods, initial encounter: Secondary | ICD-10-CM

## 2021-03-20 DIAGNOSIS — S90851A Superficial foreign body, right foot, initial encounter: Secondary | ICD-10-CM | POA: Diagnosis not present

## 2021-03-20 DIAGNOSIS — M779 Enthesopathy, unspecified: Secondary | ICD-10-CM

## 2021-03-20 MED ORDER — MUPIROCIN 2 % EX OINT: 1.0000 "application " | TOPICAL_OINTMENT | Freq: Two times a day (BID) | CUTANEOUS | 2 refills | Status: DC

## 2021-03-22 NOTE — Progress Notes (Signed)
Subjective: Tanya Peterson is a 60 y.o. is seen today in office follow-up evaluation of bilateral ankle discomfort, peroneal tendinitis.  She previously had the right side peroneal tendon dislocation repaired in March 2021.  Since then she has had ongoing issues with her right shoulder as well.  She is currently not working.  Still gets some discomfort to both of her ankles point to the lateral aspect.  She is continued home stretching, rehab exercises.  She presents today wearing regular shoe gear.  She also states that she got bit on the left medial ankle and she has not itching.  She was out in the yard working when this happened.  She was concerned about an infection.  She also has a splinter in the bottom of her right heel.  She has majority of her neuropathy there is a small piece still present.  Denies any fevers, chills, nausea, vomiting.  No calf pain, chest pain, shortness of breath.  Objective: General: No acute distress, AAOx3  DP/PT pulses palpable 2/4, CRT < 3 sec to all digits.  Protective sensation intact. Motor function intact.  There is mild discomfort along the course the peroneal tendons bilaterally.  The inversion and eversion appears to be improved.  Overall the tendons do not appear to be subluxing and they appear to be intact.  There is minimal edema there is no erythema or warmth.  No pain with ankle joint range of motion subtalar joint range of motion.  No significant pain in the sinus tarsi today.  No other areas of pinpoint tenderness.  MMT 5/5. On the medial aspect of the left navicular tuberosity area is too small red spots from possible bug bite there is no edema, erythema, drainage or pus or any signs of infection. On the plantar aspect of the right side there is a small area of puncture wound and evidence of small splinter present.  Upon debridement is able to get a small piece of wood out and there is no further foreign body identified.  No edema, erythema, drainage or  pus or any signs of infection. No pain with calf compression, swelling, warmth, erythema.    Assessment and Plan:  Follow-up peroneal tendinitis, chronic ankle pain; bug bite left side; right side foreign body  -Treatment options discussed including all alternatives, risks, and complications -Discussion today in regards to treatment options.  She does not have any further surgery.  She is able to ambulate in regular shoe however still in some discomfort.  I discussed repeating the MRI but at this point in all things can change her treatment plan.  Already continue home physical therapy, is good shoes with support.  She can use ankle brace as needed. -In regards to the bug bite on the left side she was concerned of infection.  Recommended mupirocin ointment.  We will hold off on oral antibiotics because she is apparently did have an aspiration of her shoulder next week which I would assume would have a culture performed.  However monitor for any signs or symptoms of infection. -In regards to the right side x-rays obtained reviewed.  No evidence of acute fracture, foreign body.  I sharply debrided the area and is able to remove a small piece of wood.  After debridement no further foreign body identified.  Patient is to continue bleeding and ultrasound.  Irrigated with alcohol and Betadine ointment and a bandage applied.  30 minutes were spent with patient greater than 50% of time was in face-to-face contact.  Trula Slade DPM

## 2021-04-11 ENCOUNTER — Other Ambulatory Visit: Payer: Self-pay

## 2021-04-11 ENCOUNTER — Ambulatory Visit: Payer: 59 | Admitting: Podiatry

## 2021-04-11 DIAGNOSIS — M7671 Peroneal tendinitis, right leg: Secondary | ICD-10-CM

## 2021-04-11 DIAGNOSIS — G8929 Other chronic pain: Secondary | ICD-10-CM | POA: Diagnosis not present

## 2021-04-11 DIAGNOSIS — M25572 Pain in left ankle and joints of left foot: Secondary | ICD-10-CM

## 2021-04-11 DIAGNOSIS — M25571 Pain in right ankle and joints of right foot: Secondary | ICD-10-CM

## 2021-04-11 MED ORDER — NITROGLYCERIN 0.2 MG/HR TD PT24: 0.2000 mg | MEDICATED_PATCH | Freq: Every day | TRANSDERMAL | 12 refills | Status: DC

## 2021-04-18 NOTE — Progress Notes (Signed)
  Subjective: Tanya Peterson is a 60 y.o. is seen today in office follow-up evaluation of bilateral ankle discomfort, peroneal tendinitis.  Patient states she is having soreness on both sides pointing to the lateral aspect along the sinus tarsi as well as with certain range of motion.  Pain starts after she stands for some time.  No recent injury or falls or changes otherwise since I last saw her.  Other areas that she was seen last appointment for has improved.  Objective: General: No acute distress, AAOx3 -presents wearing regular shoe. DP/PT pulses palpable 2/4, CRT < 3 sec to all digits.  Protective sensation intact. Motor function intact.  There is mild discomfort along the course the peroneal tendons bilaterally.  Over the tendons appear to be intact there is no significant subluxation noted particular the right side.  She has some discomfort with eversion of the foot or inversion seen to be at end range of motion.  Trace edema.  No erythema or warmth.  No obvious signs of infection noted today.  No areas of point tenderness identified otherwise. No pain with calf compression, swelling, warmth, erythema.    Assessment and Plan:  Follow-up peroneal tendinitis, chronic ankle pain; bug bite left side; right side foreign body  -Treatment options discussed including all alternatives, risks, and complications -At this point I would like for her to continue with home physical therapy, range of motion exercises.  Continue shoes, good arch supports.  I do expect this to continue to improve hopefully currently she is at about 75% compared to what she was prior to the injury.  She showed me a picture of her legs after she is on her feet quite a while) entire legs were swollen which should resolve.  Recommend compression stockings.  Also ordered nitro patches to apply to the tendon area.  Discussed MRI but at this point not been changing and she does not want undergo any further surgery as she is still working  on her shoulder issues.  Trula Slade DPM

## 2021-05-01 ENCOUNTER — Encounter: Payer: Self-pay | Admitting: Podiatry

## 2021-05-02 ENCOUNTER — Other Ambulatory Visit: Payer: Self-pay

## 2021-05-02 ENCOUNTER — Ambulatory Visit: Payer: 59 | Admitting: Podiatry

## 2021-05-02 ENCOUNTER — Ambulatory Visit (INDEPENDENT_AMBULATORY_CARE_PROVIDER_SITE_OTHER): Payer: 59

## 2021-05-02 DIAGNOSIS — S99922A Unspecified injury of left foot, initial encounter: Secondary | ICD-10-CM

## 2021-05-06 NOTE — Progress Notes (Signed)
Subjective: 60 year old female presents the office today for an acute appointment given pain to her left third toe.  She states that she is trying to get out of the bathtub last night and she hit her toe after she lost her balance.  No other injuries.  No recent treatment. Denies any systemic complaints such as fevers, chills, nausea, vomiting. No acute changes since last appointment, and no other complaints at this time.   Objective: AAO x3, NAD DP/PT pulses palpable bilaterally, CRT less than 3 seconds There is bruising and swelling present mostly at the distal aspect left third toe.  There is tenderness to the entire toe.  There is no pain in metatarsal.  No other areas of discomfort identified to the foot. No pain with calf compression, swelling, warmth, erythema  Assessment: Left foot third toe injury  Plan: -All treatment options discussed with the patient including all alternatives, risks, complications.  -X-rays obtained reviewed.  Questionable hairline fracture noted of the proximal phalanx base but there is no displacement. -Recommend continued buddy splinting.  Discussed wearing stiffer soled shoes.  Monitor the toenail as given the bruising and swelling that could cause irritation of the nail or have her come off.  There is no bruising under the nail. -Patient encouraged to call the office with any questions, concerns, change in symptoms.   Trula Slade DPM

## 2021-05-30 ENCOUNTER — Encounter: Payer: Self-pay | Admitting: Podiatry

## 2021-05-30 ENCOUNTER — Other Ambulatory Visit: Payer: Self-pay

## 2021-05-30 ENCOUNTER — Ambulatory Visit: Payer: 59 | Admitting: Podiatry

## 2021-05-30 DIAGNOSIS — M7751 Other enthesopathy of right foot: Secondary | ICD-10-CM

## 2021-05-30 DIAGNOSIS — S99922D Unspecified injury of left foot, subsequent encounter: Secondary | ICD-10-CM

## 2021-05-30 DIAGNOSIS — M7750 Other enthesopathy of unspecified foot: Secondary | ICD-10-CM | POA: Diagnosis not present

## 2021-05-30 MED ORDER — TRIAMCINOLONE ACETONIDE 10 MG/ML IJ SUSP
10.0000 mg | Freq: Once | INTRAMUSCULAR | Status: AC
  Administered 2021-05-30: 10 mg

## 2021-06-02 NOTE — Progress Notes (Signed)
Subjective: 60 year old female presents the office today for follow-up evaluation of her left third toe pain.  She still gets some tenderness although somewhat better.  Also she states that she had her second toe is been somewhat tender but has gotten better.  She points to the lateral aspects of bilateral feet along the sinus tarsi where she is majority discomfort.  She denies any other recent injury or trauma or any other changes.  Denies any systemic complaints such as fevers, chills, nausea, vomiting. No acute changes since last appointment, and no other complaints at this time.  She just recently got back from vacation at the beach.   Objective: AAO x3, NAD DP/PT pulses palpable bilaterally, CRT less than 3 seconds Persistent localized edema mostly distal aspect left third toe.  Mild discomfort.  No open lesion.  Superficial abrasion of the second toe without any drainage or pus or signs of infection.  Majority tenderness is localized along the sinus tarsi bilaterally with the right side worse than left.  Minimal edema.  No erythema or warmth.  No pain with ankle joint range of motion. No pain with calf compression, swelling, warmth, erythema  Assessment: Left foot third toe injury-somewhat improved; capsulitis  Plan: -All treatment options discussed with the patient including all alternatives, risks, complications.  -Steroid injection for the right sinus tarsi today.  If she does well with this we will do the left side.  On the right side the skin was prepped with Betadine, alcohol and a mixture of 1 cc Kenalog 10, 1 cc of lidocaine plain was infiltrated into the sinus tarsi without complications.  Postinjection care discussed.  Tolerated the procedure well. -Continue with supportive shoe gear. -She continue with home stretching, rehab as well for the peroneal tendons.  Trula Slade DPM

## 2021-06-27 ENCOUNTER — Other Ambulatory Visit: Payer: Self-pay

## 2021-06-27 ENCOUNTER — Encounter: Payer: Self-pay | Admitting: Podiatry

## 2021-06-27 ENCOUNTER — Ambulatory Visit: Payer: 59 | Admitting: Podiatry

## 2021-06-27 ENCOUNTER — Ambulatory Visit (INDEPENDENT_AMBULATORY_CARE_PROVIDER_SITE_OTHER): Payer: 59

## 2021-06-27 DIAGNOSIS — S93402A Sprain of unspecified ligament of left ankle, initial encounter: Secondary | ICD-10-CM

## 2021-06-27 DIAGNOSIS — M7752 Other enthesopathy of left foot: Secondary | ICD-10-CM | POA: Diagnosis not present

## 2021-06-27 MED ORDER — TRIAMCINOLONE ACETONIDE 10 MG/ML IJ SUSP
10.0000 mg | Freq: Once | INTRAMUSCULAR | Status: AC
  Administered 2021-06-27: 10 mg

## 2021-06-30 NOTE — Progress Notes (Signed)
Subjective: 60 year old female presents the office today for follow-up evaluation of ankle pain.  She had the injection on her right foot and is helped quite a bit.  She does still report some discomfort with the injection was helpful.  She states that she has injured her left ankle since I last saw her.  She was walking around the house and in the door jam she lost her balance and twisted her left ankle.  She has been feeling better.  Majority tenderness but she points along medial ankle.  No treatment after the injury.  We discussed doing a steroid injection in the left today and she wants to proceed with this.  Objective: AAO x3, NAD-presents today wearing regular shoe gear with normal gait. DP/PT pulses palpable bilaterally, CRT less than 3 seconds On the right side along the sinus tarsi still having some discomfort but overall improved.  There is minimal edema.  There is no erythema or warmth.  There is tenderness on the left sinus tarsi with localized edema today.  There is no erythema or warmth.  Some tenderness on the medial aspect of ankle, distal portion of medial malleolus as well as the flexor tendon.  Tendon appears to be intact.  No significant edema, erythema. No pain with calf compression, swelling, warmth, erythema  Assessment: Left ankle contusion; subtalar joint capsulitis  Plan: -All treatment options discussed with the patient including all alternatives, risks, complications.  -X-rays obtained and reviewed left ankle.  No evidence of acute fracture. -For the contusion that seems to be improving.  She can continue anti-inflammatories as well as topical Voltaren if needed.  Ice daily. -Steroid injection for the left sinus tarsi today. On the left side the skin was prepped with Betadine, alcohol and a mixture of 1 cc Kenalog 10, 1 cc of lidocaine plain was infiltrated into the sinus tarsi without complications.  Postinjection care discussed.  Tolerated the procedure well. -Continue  with supportive shoe gear. -Continue stretching, rehab exercises for the peroneal tendon.  Trula Slade DPM

## 2021-08-04 ENCOUNTER — Encounter (HOSPITAL_BASED_OUTPATIENT_CLINIC_OR_DEPARTMENT_OTHER): Payer: Self-pay | Admitting: Emergency Medicine

## 2021-08-04 ENCOUNTER — Emergency Department (HOSPITAL_BASED_OUTPATIENT_CLINIC_OR_DEPARTMENT_OTHER)
Admission: EM | Admit: 2021-08-04 | Discharge: 2021-08-04 | Disposition: A | Discharge status: Home / Self Care | Payer: 59 | Attending: Emergency Medicine | Admitting: Emergency Medicine

## 2021-08-04 ENCOUNTER — Other Ambulatory Visit: Payer: Self-pay

## 2021-08-04 DIAGNOSIS — S80862A Insect bite (nonvenomous), left lower leg, initial encounter: Secondary | ICD-10-CM | POA: Diagnosis present

## 2021-08-04 DIAGNOSIS — Z7982 Long term (current) use of aspirin: Secondary | ICD-10-CM | POA: Diagnosis not present

## 2021-08-04 DIAGNOSIS — J4530 Mild persistent asthma, uncomplicated: Secondary | ICD-10-CM | POA: Diagnosis not present

## 2021-08-04 DIAGNOSIS — W57XXXA Bitten or stung by nonvenomous insect and other nonvenomous arthropods, initial encounter: Secondary | ICD-10-CM | POA: Diagnosis not present

## 2021-08-04 NOTE — ED Notes (Signed)
Patient left ED with ABCs intact, alert and oriented x4, respirations even and unlabored. Discharge instructions reviewed and all questions answered.   

## 2021-08-04 NOTE — ED Provider Notes (Signed)
[facility] Provider Note   CSN: [id number] Arrival date & time: 08/04/21  1926     History Chief Complaint  Patient presents with   Insect Bite    Tanya Peterson is a 60 y.o. female.  Patient presents to the emergency department today for evaluation of a spider bite occurring just prior to arrival.  Patient developed acute pain in the left lower leg while she was outside.  She looked down and saw a brown spider on her leg.  It bit her through her jeans.  She brushed the spider away.  Afterwards she had about a half dollar sized circular area of redness.  She continued to have a sharp pain in the area.  This redness has gradually improved.  No treatments prior to arrival.  No other injuries reported.      Past Medical History:  Diagnosis Date   ADHD (attention deficit hyperactivity disorder)    Asthma    rarely uses inhaler   Bipolar affective disorder ([place])    Complication of anesthesia    Depression    Fibromuscular dysplasia of cervicocranial artery (HCC)    History of kidney stones    Panic attacks    PONV (postoperative nausea and vomiting)    Sleep disorder    tx with nuvigal    Patient Active Problem List   Diagnosis Date Noted   History of melanoma 01/27/2021   Impingement syndrome, shoulder, left 01/13/2021   Subluxation of peroneal tendon of right foot 03/25/2020   Adhesive capsulitis of right shoulder, rotator cuff repair post re-tear 03/04/2020   Vulvitis 01/20/2019   Bilateral carotid artery stenosis 08/07/2017   ADHD (attention deficit hyperactivity disorder) 07/05/2016   Neurologic complaint, functional 07/05/2016   S/P tendon repair 07/05/2016   Fibromuscular dysplasia (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   Incomplete emptying of bladder 07/14/2014   Obesity (BMI 30.0-34.9) 06/23/2014    Osteopenia 06/23/2014   Bipolar disorder, unspecified ([place]) 12/31/2013   Anxiety disorder 07/09/2013   Migraine 04/25/2013   Sleep apnea 04/25/2013    Past Surgical History:  Procedure Laterality Date   BREAST EXCISIONAL BIOPSY Right 10/02/2017   papilloma   BREAST LUMPECTOMY WITH RADIOACTIVE SEED LOCALIZATION Right 10/02/2017   Procedure: RIGHT BREAST LUMPECTOMY WITH RADIOACTIVE SEED LOCALIZATION ERAS PATHWAY;  Surgeon: Jovita Kussmaul, MD;  Location: [facility];  Service: General;  Laterality: Right;   COLONOSCOPY WITH PROPOFOL N/A 01/11/2015   Procedure: COLONOSCOPY WITH PROPOFOL;  Surgeon: Garlan Fair, MD;  Location[facility];  Service: Endoscopy;  Laterality: N/A;   HYSTEROSCOPY WITH D & C N/A 03/05/2016   Procedure: DILATATION AND CURETTAGE /HYSTEROSCOPY;  Surgeon: Vanessa Kick, MD;  Location: [facility];  Service: Gynecology;  Laterality: N/A;   kdiney stone surgery  12/2015   LIPOSUCTION     TENDON REPAIR Left 06/20/2016   foot   WISDOM TOOTH EXTRACTION       OB History   No obstetric history on file.     Family History  Problem Relation Age of Onset   Alzheimer's disease Mother    Alzheimer's disease Father    Breast cancer Paternal Aunt     Social History   Tobacco Use   Smoking status: Never   Smokeless tobacco: Never  Substance Use Topics   Alcohol use: No   Drug use: No  Home Medications Prior to Admission medications   Medication Sig Start Date End Date Taking? Authorizing Provider  acetaminophen (TYLENOL) 325 MG tablet Take 650 mg by mouth every 6 (six) hours as needed.    [provider]  albuterol (PROVENTIL HFA;VENTOLIN HFA) 108 (90 BASE) MCG/ACT inhaler Inhale 1 puff into the lungs every 30 (thirty) minutes as needed for wheezing or shortness of breath.    [provider]  amphetamine-dextroamphetamine (ADDERALL XR) 10 MG 24 hr capsule dextroamphetamine-amphetamine ER 10 mg 24hr capsule,extend release 11/18/20    [provider]  Ascorbic Acid (VITAMIN C) 1000 MG tablet Take 1,000 mg by mouth daily.    [provider]  aspirin EC 81 MG tablet Take 1 tablet (81 mg total) by mouth daily. 04/27/16   Harris, Abigail, PA-C  azithromycin (ZITHROMAX) 250 MG tablet azithromycin 250 mg tablet  TAKE 2 TABLETS BY MOUTH FIRST DAY THEN TAKE 1 TABLET BY MOUTH EVERY DAY THEREAFTER    [provider]  bimatoprost (LATISSE) 0.03 % ophthalmic solution See admin instructions. 06/21/21   [provider]  Bioflavonoid Products (GRAPE SEED PO) Take by mouth. Grape seed with resveratrol    [provider]  CALCIUM PO Take by mouth as directed.    [provider]  Cholecalciferol (VITAMIN D3) 1000 units CAPS Take by mouth daily.    [provider]  clotrimazole-betamethasone (LOTRISONE) cream Apply topically 2 (two) times daily. 08/19/20   [provider]  diclofenac (VOLTAREN) 75 MG EC tablet Take 75 mg by mouth 2 (two) times daily. 06/21/20   [provider]  doxycycline (VIBRA-TABS) 100 MG tablet doxycycline hyclate 100 mg tablet    [provider]  Estradiol ([place]) Place vaginally as directed.    [provider]  Estradiol 10 MCG TABS vaginal tablet Place 1 tablet vaginally 2 (two) times a week. 02/03/20   [provider]  Ferrous Sulfate (IRON) 325 (65 Fe) MG TABS Take by mouth daily.    [provider]  [place] COVID-19 [place] TEST KIT  04/12/21   [provider]  gabapentin (NEURONTIN) 100 MG capsule Take by mouth 2 (two) times daily. 02/28/21   [provider]  HYDROcodone-acetaminophen (NORCO/VICODIN) 5-325 MG tablet Take 1 tablet by mouth every 8 (eight) hours as needed for moderate pain. 01/13/21   Silverio Decamp, MD  ibuprofen (ADVIL) 600 MG tablet Take by mouth. Pt takes as needed. 06/20/20   [provider]  KRILL OIL PO Take by mouth as directed.    [provider]  LORazepam (ATIVAN) 0.5 MG tablet Take 0.5 mg by mouth. As needed. 04/15/20   [provider]  magnesium oxide (MAG-OX) 400 MG tablet Take 400 mg by mouth daily.    [provider]  meloxicam (MOBIC) 15 MG tablet meloxicam 15 mg tablet    [provider]  methocarbamol (ROBAXIN) 500 MG tablet methocarbamol 500 mg tablet    [provider]  methylPREDNISolone (MEDROL DOSEPAK) 4 MG TBPK tablet Take by mouth. 11/29/20   [provider]  mupirocin ointment (BACTROBAN) 2 % Apply 1 application topically 2 (two) times daily. 03/20/21   Trula Slade, DPM  nitroGLYCERIN (NITRO-DUR) 0.2 mg/hr patch Place 1 patch (0.2 mg total) onto the skin daily. 04/11/21   Trula Slade, DPM  NON FORMULARY C-Medicare scar cream--Use as directed    [provider]  [facility]  Creams-#14    [provider]  oxyCODONE (OXY IR/ROXICODONE) 5 MG immediate release tablet oxycodone 5 mg tablet    [provider]  tiZANidine (ZANAFLEX) 2 MG tablet tizanidine 2 mg tablet    [provider]  triamcinolone (KENALOG) 0.025 % ointment Apply 1 application topically 2 (two) times daily. 08/07/16   Trula Slade, DPM  triamcinolone ointment (KENALOG) 0.5 % Apply topically. 05/23/21   [provider]  vitamin B-12 (CYANOCOBALAMIN) 1000 MCG tablet Take 1,000 mcg by mouth daily.    [provider]  ZOFRAN 4 MG tablet Take 1 tablet by mouth every six hours as needed for nausea 10/11/20   [provider]    Allergies    Other, Tyramine, Lactose, No known allergies, Lactose intolerance (gi), Lactulose, and Neosporin [neomycin-bacitracin zn-polymyx]  Review of Systems   Review of Systems  Constitutional:  Negative for fever.  Gastrointestinal:  Negative for nausea and vomiting.  Musculoskeletal:  Positive for myalgias. Negative for arthralgias.  Skin:  Positive for color change and wound.    Physical Exam Updated Vital Signs BP 140/75 (BP Location: Left Arm)   Pulse 82   Temp 98.5 F (36.9 C) (Oral)   Resp 18   Ht 5' 4"  (1.626 m)   Wt 77 kg   SpO2 100%   BMI 29.14 kg/m   Physical Exam Vitals and nursing note reviewed.  Constitutional:      Appearance: She is well-developed.  HENT:     Head: Normocephalic and atraumatic.  Eyes:     Conjunctiva/sclera: Conjunctivae normal.  Pulmonary:     Effort: No respiratory distress.  Musculoskeletal:     Cervical back: Normal range of motion and neck supple.  Skin:    General: Skin is warm and dry.     Comments: On the left lower leg there is a small circular area of light erythema measuring approximately 2 cm in diameter with a dark area centrally where she was bitten.  No signs of blistering, ulceration.  No lymphangitic spread.  Neurological:     Mental Status: She is alert.    ED Results / Procedures / Treatments   Labs (all labs ordered are listed, but only abnormal results are displayed) Labs Reviewed - No data to display  EKG None  Radiology No results found.  Procedures Procedures   Medications Ordered in ED Medications - No data to display  ED Course  I have reviewed the triage vital signs and the nursing notes.  Pertinent labs & imaging results that were available during my care of the patient were reviewed by me and considered in my medical decision making (see chart for details).  Patient seen and examined.  Discussed wound care with patient.  She has 800 mg ibuprofen at home that she takes for shoulder pain and she will take it for this.  We discussed signs and symptoms to return. Pt urged to return with worsening pain, worsening swelling, expanding area of redness or streaking up extremity, fever, or any other concerns. Pt verbalizes understanding and agrees with plan.  Vital signs reviewed and are as follows: BP 140/75 (BP Location: Left Arm)   Pulse 82   Temp 98.5 F (36.9 C) (Oral)    Resp 18   Ht 5' 4"  (1.626 m)   Wt 77 kg   SpO2 100%   BMI 29.14 kg/m      MDM Rules/Calculators/A&P  Patient with spider bite, currently mild local reaction.  Patient will treat accordingly and monitor closely.  If she develops worsening symptoms as above, she will seek recheck.  Tetanus up-to-date (~2017)   Final Clinical Impression(s) / ED Diagnoses Final diagnoses:  Insect bite of left lower leg, initial encounter    Rx / DC Orders ED Discharge Orders     None        Suann Larry 08/04/21 2148    Davonna Belling, MD 08/05/21 0003

## 2021-08-04 NOTE — ED Triage Notes (Signed)
Reports being bit by a spider on the left lower leg around 7pm.  Reports the spider was brown in color.

## 2021-08-04 NOTE — Discharge Instructions (Signed)
Please read and follow all provided instructions.  Your diagnoses today include:  1. Insect bite of left lower leg, initial encounter     Tests performed today include: Vital signs. See below for your results today.   Medications prescribed:  None  Take any prescribed medications only as directed.   Home care instructions:  Follow any educational materials contained in this packet  Follow-up instructions: See your doctor for wound recheck if you have worsening.  Return instructions:  Return to the Emergency Department if you have: Fever Worsening symptoms Worsening pain Worsening swelling, blistering, or ulceration of forms at the site of the bite Redness of the skin that moves away from the affected area, especially if it streaks away from the affected area  Any other emergent concerns  Your vital signs today were: BP 140/75 (BP Location: Left Arm)   Pulse 82   Temp 98.5 F (36.9 C) (Oral)   Resp 18   Ht '5\' 4"'$  (1.626 m)   Wt 77 kg   SpO2 100%   BMI 29.14 kg/m  If your blood pressure (BP) was elevated above 135/85 this visit, please have this repeated by your doctor within one month. --------------

## 2021-08-08 ENCOUNTER — Ambulatory Visit: Payer: 59 | Admitting: Podiatry

## 2021-08-08 ENCOUNTER — Other Ambulatory Visit: Payer: Self-pay

## 2021-08-08 ENCOUNTER — Encounter: Payer: Self-pay | Admitting: Podiatry

## 2021-08-08 DIAGNOSIS — T63301A Toxic effect of unspecified spider venom, accidental (unintentional), initial encounter: Secondary | ICD-10-CM

## 2021-08-08 DIAGNOSIS — M7751 Other enthesopathy of right foot: Secondary | ICD-10-CM | POA: Diagnosis not present

## 2021-08-09 ENCOUNTER — Other Ambulatory Visit: Payer: Self-pay | Admitting: Podiatry

## 2021-08-09 ENCOUNTER — Encounter: Payer: Self-pay | Admitting: Podiatry

## 2021-08-09 MED ORDER — DOXYCYCLINE HYCLATE 100 MG PO TABS: 100.0000 mg | ORAL_TABLET | Freq: Two times a day (BID) | ORAL | 0 refills | Status: DC

## 2021-08-09 NOTE — Telephone Encounter (Signed)
I called and spoke with the patient. When she was in the office she showed be a spider bite on the left calf. She states it has been getting more red. She followed up with her PCP and attempted to lance it but no drainage. She did a warm compression overnight but she is concerned that the redness is not any better and she is going out of town. I sent doxycyline to the pharmacy. I told her to mark the area of redness and if any worsening she needs to go back to the PCP or go to the ER. She verbalized understanding.

## 2021-08-11 NOTE — Progress Notes (Signed)
Subjective: 60 year old female presents the office today for follow-up evaluation of ankle pain on both sides.  She states injections have been helpful.  She started to get reoccurrence of pain on the right side she has an upcoming wedding that she like to be able to wear better shoes for.  No recent injury or trauma to her ankles.  She states that she does have a bug bite on her left side, spider bite.  She was seen in the emergency department.  She has not had any antibiotics but she states the area is getting more red around the edges.  She has a follow-up with her primary care doctor later today.  Objective: AAO x3, NAD DP/PT pulses palpable bilaterally, CRT less than 3 seconds There is tenderness palpation on sinus tarsi on the right side.  No significant discomfort along the course of the peroneal tendon.  No significant discomfort of the left sinus tarsi.  There is faint edema to the sinus tarsi but there is no erythema or warmth.  No area of pinpoint tenderness otherwise. On the proximal left calf is a bug bite with mild induration and surrounding erythema approximately 1 cm.  There is no ascending cellulitis.  No fluctuation. No open lesions or pre-ulcerative lesions.  No pain with calf compression, swelling, warmth, erythema  Assessment: 60 year old female with capsulitis right side, bug bite left calf  Plan: -All treatment options discussed with the patient including all alternatives, risks, complications.  -She was proceed with a steroid injection on the right side as she has an upcoming wedding.  Skin was prepped with alcohol and mixture 1 cc Kenalog 10, 1 cc mixture lidocaine, Marcaine plain was infiltrated into the area of the sinus tarsi without complications.  Postinjection care discussed.  Tolerated the procedure well. -Regards to the spider bite she has an appointment later today for follow-up on this.  Discussed oral antibiotics but will let her other doctor evaluate. -Patient  encouraged to call the office with any questions, concerns, change in symptoms.   Trula Slade DPM

## 2021-08-15 NOTE — Progress Notes (Signed)
HPI: FU palpitations. Seen in the emergency room June 2017 with palpitations. ECG with sinus tachycardia and nonspecific ST changes. 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 Echocardiogram August 2017 showed normal LV systolic function. Exercise treadmill May 2018 was normal.  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  Carotid Dopplers November 2021 near normal bilaterally.  Since last seen patient denies dyspnea or syncope.  She has occasional chest tightness with stressful situation but denies exertional chest pain.  Occasional brief flutters but no sustained palpitations.  Current Outpatient Medications  Medication Sig Dispense Refill   acetaminophen (TYLENOL) 325 MG tablet Take 650 mg by mouth every 6 (six) hours as needed.     albuterol (PROVENTIL HFA;VENTOLIN HFA) 108 (90 BASE) MCG/ACT inhaler Inhale 1 puff into the lungs every 30 (thirty) minutes as needed for wheezing or shortness of breath.     amphetamine-dextroamphetamine (ADDERALL XR) 10 MG 24 hr capsule dextroamphetamine-amphetamine ER 10 mg 24hr capsule,extend release     Ascorbic Acid (VITAMIN C) 1000 MG tablet Take 1,000 mg by mouth daily.     aspirin EC 81 MG tablet Take 1 tablet (81 mg total) by mouth daily. 30 tablet 1   bimatoprost (LATISSE) 0.03 % ophthalmic solution See admin instructions.     Bioflavonoid Products (GRAPE SEED PO) Take by mouth. Grape seed with resveratrol     CALCIUM PO Take by mouth as directed.     Cholecalciferol (VITAMIN D3) 1000 units CAPS Take by mouth daily.     clotrimazole-betamethasone (LOTRISONE) cream Apply topically 2 (two) times daily.     diclofenac (VOLTAREN) 75 MG EC tablet Take 75 mg by mouth 2 (two) times daily.     doxycycline (VIBRA-TABS) 100 MG tablet Take 1 tablet (100 mg total) by mouth 2 (two) times daily. 14 tablet 0   Estradiol (VAGIFEM VA) Place vaginally  as directed.     Estradiol 10 MCG TABS vaginal tablet Place 1 tablet vaginally 2 (two) times a week.     [place] COVID-19 [place] HOME TEST KIT      gabapentin (NEURONTIN) 100 MG capsule Take by mouth 2 (two) times daily.     gabapentin (NEURONTIN) 300 MG capsule Take 300 mg by mouth daily.     HYDROcodone-acetaminophen (NORCO/VICODIN) 5-325 MG tablet Take 1 tablet by mouth every 8 (eight) hours as needed for moderate pain. 15 tablet 0   ibuprofen (ADVIL) 600 MG tablet Take by mouth. Pt takes as needed.     KRILL OIL PO Take by mouth as directed.     LORazepam (ATIVAN) 0.5 MG tablet Take 0.5 mg by mouth. As needed.     magnesium oxide (MAG-OX) 400 MG tablet Take 400 mg by mouth daily.     meloxicam (MOBIC) 15 MG tablet meloxicam 15 mg tablet     methocarbamol (ROBAXIN) 500 MG tablet methocarbamol 500 mg tablet     methylPREDNISolone (MEDROL DOSEPAK) 4 MG TBPK tablet Take by mouth.     mupirocin ointment (BACTROBAN) 2 % Apply 1 application topically 2 (two) times daily. 30 g 2   nitroGLYCERIN (NITRO-DUR) 0.2 mg/hr patch Place 1 patch (0.2 mg total) onto the skin daily. 30 patch 12   NON FORMULARY C-Medicare scar cream--Use as directed     NON [facility]  Creams-#14     oxyCODONE (OXY IR/ROXICODONE) 5 MG immediate release tablet oxycodone 5 mg tablet  tiZANidine (ZANAFLEX) 2 MG tablet tizanidine 2 mg tablet     triamcinolone (KENALOG) 0.025 % ointment Apply 1 application topically 2 (two) times daily. 30 g 0   triamcinolone ointment (KENALOG) 0.5 % Apply topically.     vitamin B-12 (CYANOCOBALAMIN) 1000 MCG tablet Take 1,000 mcg by mouth daily.     ZOFRAN 4 MG tablet Take 1 tablet by mouth every six hours as needed for nausea     Ferrous Sulfate (IRON) 325 (65 Fe) MG TABS Take by mouth daily. (Patient not taking: Reported on 08/23/2021)     No current facility-administered medications for this visit.     Past Medical History:  Diagnosis Date   ADHD (attention deficit  hyperactivity disorder)    Asthma    rarely uses inhaler   Bipolar affective disorder ([place])    Complication of anesthesia    Depression    Fibromuscular dysplasia of cervicocranial artery ([place])    History of kidney stones    Panic attacks    PONV (postoperative nausea and vomiting)    Sleep disorder    tx with nuvigal    Past Surgical History:  Procedure Laterality Date   BREAST EXCISIONAL BIOPSY Right 10/02/2017   papilloma   BREAST LUMPECTOMY WITH RADIOACTIVE SEED LOCALIZATION Right 10/02/2017   Procedure: RIGHT BREAST LUMPECTOMY WITH RADIOACTIVE SEED LOCALIZATION ERAS PATHWAY;  Surgeon: Jovita Kussmaul, MD;  Location: [facility];  Service: General;  Laterality: Right;   COLONOSCOPY WITH PROPOFOL N/A 01/11/2015   Procedure: COLONOSCOPY WITH PROPOFOL;  Surgeon: Garlan Fair, MD;  Location[facility];  Service: Endoscopy;  Laterality: N/A;   HYSTEROSCOPY WITH D & C N/A 03/05/2016   Procedure: DILATATION AND CURETTAGE /HYSTEROSCOPY;  Surgeon: Vanessa Kick, MD;  Location: [facility];  Service: Gynecology;  Laterality: N/A;   kdiney stone surgery  12/2015   LIPOSUCTION     TENDON REPAIR Left 06/20/2016   foot   WISDOM TOOTH EXTRACTION      Social History   Socioeconomic History   Marital status: Single    Spouse name: Not on file   Number of children: Not on file   Years of education: Not on file   Highest education level: Not on file  Occupational History   Occupation: Freight forwarder at [employer] Use   Smoking status: Never   Smokeless tobacco: Never  Substance and Sexual Activity   Alcohol use: No   Drug use: No   Sexual activity: Yes    Birth control/protection: Post-menopausal  Other Topics Concern   Not on file  Social History Narrative   Not on file   Social Determinants of Health   Financial Resource Strain: Not on file  Food Insecurity: Not on file  Transportation Needs: Not on file  Physical Activity: Not on file  Stress: Not on file   Social Connections: Not on file  Intimate Partner Violence: Not on file    Family History  Problem Relation Age of Onset   Alzheimer's disease Mother    Alzheimer's disease Father    Breast cancer Paternal Aunt     ROS: no fevers or chills, productive cough, hemoptysis, dysphasia, odynophagia, melena, hematochezia, dysuria, hematuria, rash, seizure activity, orthopnea, PND, pedal edema, claudication. Remaining systems are negative.  Physical Exam: Well-developed well-nourished in no acute distress.  Skin is warm and dry.  HEENT is normal.  Neck is supple.  Chest is clear to auscultation with normal expansion.  Cardiovascular exam is regular rate and  rhythm.  Abdominal exam nontender or distended. No masses palpated. Extremities show no edema. neuro grossly intact  ECG- NSR, no ST changes; personally reviewed  A/P  1 palpitations-symptoms are controlled.  We will consider addition of beta-blockade in the future if necessary.  2 chest pain-symptoms atypical.  Previous stress test unremarkable.  We will follow for now.  3 history of fibromuscular dysplasia-we will arrange follow-up carotid Dopplers in the future.  Kirk Ruths, MD

## 2021-08-17 NOTE — Telephone Encounter (Signed)
error 

## 2021-08-23 ENCOUNTER — Encounter: Payer: Self-pay | Admitting: Cardiology

## 2021-08-23 ENCOUNTER — Other Ambulatory Visit: Payer: Self-pay

## 2021-08-23 ENCOUNTER — Ambulatory Visit: Payer: 59 | Admitting: Cardiology

## 2021-08-23 VITALS — BP 122/74 | HR 84 | Ht 64.0 in | Wt 162.0 lb

## 2021-08-23 DIAGNOSIS — I773 Arterial fibromuscular dysplasia: Secondary | ICD-10-CM

## 2021-08-23 DIAGNOSIS — R072 Precordial pain: Secondary | ICD-10-CM | POA: Diagnosis not present

## 2021-08-23 DIAGNOSIS — R002 Palpitations: Secondary | ICD-10-CM

## 2021-08-23 NOTE — Patient Instructions (Signed)
  Follow-Up: At [facility], you and your health needs are our priority.  As part of our continuing mission to provide you with exceptional heart care, we have created designated Provider Care Teams.  These Care Teams include your primary Cardiologist (physician) and Advanced Practice Providers (APPs -  Physician Assistants and Nurse Practitioners) who all work together to provide you with the care you need, when you need it.  We recommend signing up for the patient portal called "MyChart".  Sign up information is provided on this After Visit Summary.  MyChart is used to connect with patients for Virtual Visits (Telemedicine).  Patients are able to view lab/test results, encounter notes, upcoming appointments, etc.  Non-urgent messages can be sent to your provider as well.   To learn more about what you can do with MyChart, go to [email or web address].    Your next appointment:    June 2023

## 2021-10-10 ENCOUNTER — Ambulatory Visit: Payer: 59 | Admitting: Podiatry

## 2021-10-10 ENCOUNTER — Other Ambulatory Visit: Payer: Self-pay

## 2021-10-10 DIAGNOSIS — M7751 Other enthesopathy of right foot: Secondary | ICD-10-CM | POA: Diagnosis not present

## 2021-10-10 MED ORDER — TRIAMCINOLONE ACETONIDE 10 MG/ML IJ SUSP
10.0000 mg | Freq: Once | INTRAMUSCULAR | Status: AC
  Administered 2021-10-10: 10 mg

## 2021-10-16 NOTE — Progress Notes (Signed)
Subjective: 60 year old female presents the office today for follow-up evaluation of ankle pain on both sides.  She said that she was doing better.  She is right side was doing better at about a week ago and started to have increased symptoms plantar lateral aspect along the sinus tarsi.  Left foot she states, doing okay and having little soreness.  Objective: AAO x3, NAD DP/PT pulses palpable bilaterally, CRT less than 3 seconds There is tenderness palpation on sinus tarsi on the right side.  Moscone course the peroneal tendons but mostly paraspinal and sinus tarsi.  Minimal discomfort on the left side on the sinus tarsi.  No area pinpoint tenderness identified bilaterally.  Ankle, subtalar joint range of motion intact.  Her flexible appears to be somewhat improved particularly eversion of the right foot. No open lesions or pre-ulcerative lesions.  No pain with calf compression, swelling, warmth, erythema  Assessment: 60 year old female with capsulitis right side  Plan: -All treatment options discussed with the patient including all alternatives, risks, complications.  -She was to proceed with a steroid injection on the right foot today.  I discussed with her risks of repeated steroid injections.  She understands this and wishes to proceed.  Skin was prepped with alcohol and mixture 1 cc Kenalog 10, 1 cc mixture lidocaine, Marcaine plain was infiltrated into the area of the sinus tarsi without complications.  Postinjection care discussed.  Tolerated the procedure well. -Patient encouraged to call the office with any questions, concerns, change in symptoms.   Trula Slade DPM

## 2021-10-17 ENCOUNTER — Ambulatory Visit: Payer: 59 | Admitting: Internal Medicine

## 2021-10-27 NOTE — Progress Notes (Signed)
NEW PATIENT Date of Service/Encounter:  10/30/21 Referring provider: Bartholome Bill, MD Primary care provider: Bartholome Bill, MD  Subjective:  Tanya Peterson is a 60 y.o. female with a PMHx of asthma, ADHD, bilateral carotid artery stenosis, bipolar disease, fibromuscular dysplasia, anxiety, migraines, obesity, osteopenia, sleep apnea, history of melena presenting today for evaluation of chronic rhinitis History obtained from: chart review and patient.   Chronic rhinitis: started in her childhood Symptoms include: nasal congestion, rhinorrhea, post nasal drainage, sneezing, watery eyes, itchy eyes, and itchy nose  Potential triggers: cats, grass, dust, ragweed Had very significant symptoms last time she visit her cousin in [place] who has multiple cats.  She was worried about either the cats or the mold.  However, she has cats in her house who do not seem to bother her.   She also has a dog who also does not bother her Treatments tried: Alleclear daily-if doesn't take symptoms become severe Previous allergy testing:  35-40 years  Asthma History:  -Diagnosed in early 2000s.  -Current symptoms include cough, shortness of breath, and wheezing 0 daytime symptoms in past month, 0 nighttime awakenings in past month Using rescue inhaler very infrequently -Limitations to daily activity: none - 0 ED visits, 0 UC visits and 0 oral steroids in the past year - 0 number of lifetime hospitalizations,  - Identified Triggers: cold air, exercise - Up-to-date with Covid-19, vaccines. - History of prior pneumonias: 0 - History of prior COVID-19 infection: January 2021 - Smoking exposure: none Previous Diagnostics:  - Most Recent AEC (08/30/2021): 200 -Most Recent Chest Imaging: CXR on (02/28/17): impression-normal, reviewed by me personally, no acute pulmonary abnormalities noted Management:   - Current regimen: albuterol as needed - Maintenance: none, has used Advair in the past - Rescue:  Albuterol 2 puffs q4-6 hrs PRN, not using prior to exercise; hasn't used in the past month  Concern for Food Allergy:  Food of concern:  Gluten and spices (cause her to feel extremely sleepy) Tyramines-tachycardia, nausea, headaches - took ativan and this resolved symptoms after one hour.  She does have a history of migraines.  Back in her 44s.  Was on triptan's Calamari and shrimp (tachycardia, tongue hairs felt like they were standing up) on 1 occasion.  However, she has had shrimp and calamari since without any symptoms.  As recently as yesterday.  For this reason she is worried about spices. Milk (messes up her stomach).  However she is able to tolerate cheeses without any symptoms   She does get a rash on her underarms.  She uses hydrocoritsone 2.5% as needed.  She is using lume deodorant.  Past Medical History: Past Medical History:  Diagnosis Date   ADHD (attention deficit hyperactivity disorder)    Asthma    rarely uses inhaler   Bipolar affective disorder ([place])    Complication of anesthesia    Depression    Fibromuscular dysplasia of cervicocranial artery (HCC)    History of kidney stones    Panic attacks    PONV (postoperative nausea and vomiting)    Sleep disorder    tx with nuvigal   Medication List:  Current Outpatient Medications  Medication Sig Dispense Refill   acetaminophen (TYLENOL) 325 MG tablet Take 650 mg by mouth every 6 (six) hours as needed.     albuterol (PROVENTIL HFA;VENTOLIN HFA) 108 (90 BASE) MCG/ACT inhaler Inhale 1 puff into the lungs every 30 (thirty) minutes as needed for wheezing or shortness of breath.  amphetamine-dextroamphetamine (ADDERALL XR) 10 MG 24 hr capsule dextroamphetamine-amphetamine ER 10 mg 24hr capsule,extend release     Ascorbic Acid (VITAMIN C) 1000 MG tablet Take 1,000 mg by mouth daily.     aspirin EC 81 MG tablet Take 1 tablet (81 mg total) by mouth daily. 30 tablet 1   Bioflavonoid Products (GRAPE SEED PO) Take by mouth.  Grape seed with resveratrol     Biotin (PA BIOTIN) 5 MG CAPS Take by mouth.     CALCIUM PO Take by mouth as directed.     Cholecalciferol (VITAMIN D3) 1000 units CAPS Take by mouth daily.     COLLAGEN PO Take by mouth.     Estradiol 10 MCG TABS vaginal tablet Place 1 tablet vaginally 2 (two) times a week.     gabapentin (NEURONTIN) 100 MG capsule Take by mouth 2 (two) times daily.     Garlic 2751 MG CAPS Take by mouth.     ibuprofen (ADVIL) 800 MG tablet Take by mouth.     KRILL OIL PO Take by mouth as directed.     LORazepam (ATIVAN) 0.5 MG tablet Take 0.5 mg by mouth. As needed.     Magnesium 400 MG CAPS Take by mouth.     magnesium oxide (MAG-OX) 400 MG tablet Take 400 mg by mouth daily.     Melatonin 5 MG CAPS Take by mouth.     nitroGLYCERIN (NITRO-DUR) 0.2 mg/hr patch Place 1 patch (0.2 mg total) onto the skin daily. 30 patch 12   Turmeric 500 MG TABS Take 1,000 Units by mouth.     vitamin B-12 (CYANOCOBALAMIN) 1000 MCG tablet Take 1,000 mcg by mouth daily.     vitamin E 180 MG (400 UNITS) capsule Take 400 Units by mouth daily.     Zinc 50 MG CAPS Take by mouth.     hydrocortisone 2.5 % ointment Apply topically. (Patient not taking: Reported on 10/30/2021)     NON FORMULARY C-Medicare scar cream--Use as directed (Patient not taking: Reported on 10/30/2021)     triamcinolone ointment (KENALOG) 0.5 % Apply topically. (Patient not taking: Reported on 10/30/2021)     No current facility-administered medications for this visit.   Known Allergies:  Allergies  Allergen Reactions   Other Other (See Comments)    Glutein - puts to sleep -teramines - Elevated heart rate Glutein - puts to sleep -teramines - Elevated heart rate Glutein - puts to sleep -teramines - Elevated heart rate Glutein - puts to sleep -teramines - Elevated heart rate   Tyramine Palpitations    Cause Tachycardia   Lactose Diarrhea and Other (See Comments)   No Known Allergies    Lactulose Other (See Comments)     Upset stomach Upset stomach Upset stomach   Neosporin [Neomycin-Bacitracin Zn-Polymyx] Rash   Past Surgical History: Past Surgical History:  Procedure Laterality Date   BREAST EXCISIONAL BIOPSY Right 10/02/2017   papilloma   BREAST LUMPECTOMY WITH RADIOACTIVE SEED LOCALIZATION Right 10/02/2017   Procedure: RIGHT BREAST LUMPECTOMY WITH RADIOACTIVE SEED LOCALIZATION ERAS PATHWAY;  Surgeon: Jovita Kussmaul, MD;  Location: [facility];  Service: General;  Laterality: Right;   COLONOSCOPY WITH PROPOFOL N/A 01/11/2015   Procedure: COLONOSCOPY WITH PROPOFOL;  Surgeon: Garlan Fair, MD;  Location: [facility];  Service: Endoscopy;  Laterality: N/A;   HYSTEROSCOPY WITH D & C N/A 03/05/2016   Procedure: DILATATION AND CURETTAGE /HYSTEROSCOPY;  Surgeon: Vanessa Kick, MD;  Location: [facility];  Service: Gynecology;  Laterality: N/A;   kdiney stone surgery  12/2015   LIPOSUCTION     TENDON REPAIR Left 06/20/2016   foot   WISDOM TOOTH EXTRACTION     Family History: Family History  Problem Relation Age of Onset   Alzheimer's disease Mother    Alzheimer's disease Father    Breast cancer Paternal Aunt    Social History: Tenasia lives in a house built 24 years ago without water damage, carpet in the bedroom, gas heating, central AC, 1 cat and 1 dog, no past, no dust mite protection on the bedding, no smoke exposure, + HEPA filter, [place] interstate[place]industrial.   ROS:  All other systems negative except as noted per HPI.  Objective:  Blood pressure 122/88, pulse 88, temperature 98.1 F (36.7 C), temperature source Temporal, resp. rate 18, height 5' 3.5" (1.613 m), weight 162 lb (73.5 kg), SpO2 97 %. Body mass index is 28.25 kg/m. Physical Exam:  General Appearance:  Alert, cooperative, no distress, appears stated age  Head:  Normocephalic, without obvious abnormality, atraumatic  Eyes:  Conjunctiva clear, EOM's intact  Nose: Nares normal, slightly edematous pink turbinates   Throat: Lips, tongue normal; teeth and gums normal, normal posterior oropharynx  Neck: Supple, symmetrical  Lungs:   CTAB, Respirations unlabored, no coughing  Heart:  Regular rate and rhythm, no murmur, appears well perfused  Extremities: No edema  Skin: Skin color, texture, turgor normal, no rashes or lesions on visualized portions of skin  Neurologic: No gross deficits     Diagnostics: Spirometry:  Tracings reviewed. Her effort: It was hard to get consistent efforts and there is a question as to whether this reflects a maximal maneuver. FVC: 1.76L FEV1: 1.60L, 64% predicted FEV1/FVC ratio: 115% Interpretation: Spirometry consistent with possible restrictive disease. Suspect in part due to technique. Please see scanned spirometry results for details.  Skin Testing: Environmental allergy panel and select foods. Adequate positive and negative controls Results discussed with patient/family.  Airborne Adult Perc - 10/30/21 1002     Time Antigen Placed 1002    Allergen Manufacturer Greer    Location Back    Number of Test 59    1. Control-Buffer 50% Glycerol Negative    2. Control-Histamine 1 mg/ml 3+    3. Albumin saline 3+    4. [place] 3+    5. [place] 3+    6. [place] 4+    7. [place] 4+    8. Meadow Fescue 4+    9. Perennial Rye 4+    10. Sweet Vernal 3+    11. Timothy 4+    12. Cocklebur Negative    13. Burweed Marshelder Negative    14. Ragweed, short Negative    15. Ragweed, Giant Negative    16. Plantain,  English Negative    17. Lamb's Quarters Negative    18. Sheep Sorrell Negative    19. Rough Pigweed Negative    20. Marsh Elder, Rough Negative    21. Mugwort, Common Negative    22. Ash mix Negative    23. Birch mix 4+    24. Beech American 3+    25. Box, Elder Negative    26. Cedar, red Negative    27. Cottonwood, [place] Negative    28. Elm mix Negative    29. Hickory Negative    30. Maple mix Negative    31. Oak, [place] mix 3+    32. Pecan  Pollen Negative    33. [employer]  Negative    34. Sycamore Eastern Negative    35. [place], Black Pollen Negative    36. Alternaria alternata Negative    37. Cladosporium Herbarum Negative    38. Aspergillus mix Negative    39. Penicillium mix Negative    40. Bipolaris sorokiniana (Helminthosporium) Negative    41. Drechslera spicifera (Curvularia) Negative    42. Mucor plumbeus Negative    43. Fusarium moniliforme 2+    44. Aureobasidium pullulans (pullulara) Negative    45. Rhizopus oryzae Negative    46. Botrytis cinera Negative    47. Epicoccum nigrum Negative    48. Phoma betae Negative    49. Candida Albicans Negative    50. Trichophyton mentagrophytes Negative    51. Mite, D Farinae  5,000 AU/ml Negative    52. Mite, D Pteronyssinus  5,000 AU/ml Negative    53. Cat Hair 10,000 BAU/ml 3+    54.  Dog Epithelia 3+    55. Mixed Feathers Negative    56. Horse Epithelia Negative    57. Cockroach, German Negative    58. Mouse 3+    59. Tobacco Leaf Negative             Intradermal - 10/30/21 1117     Time Antigen Placed 1117    Allergen Manufacturer Other    Location Arm    Number of Test 8    Control Negative    Ragweed mix Negative    Weed mix Negative    Mold 1 Negative    Mold 2 Negative    Mold 3 Negative    Cockroach Negative    Mite mix Negative             Food Adult Perc - 10/30/21 1000     Time Antigen Placed 1002    Allergen Manufacturer Greer    Location Back    Number of allergen test 12    3. Wheat Negative    30. Barley Negative    31. Oat  Negative    32. Rye  Negative    65. Karaya Gum Negative    66. Acacia (Arabic Gum) Negative    67. Cinnamon Negative    68. Nutmeg Negative    69. Ginger Negative    70. Garlic Negative    71. Pepper, black Negative    72. Mustard Negative             Allergy testing results were read and interpreted by myself, documented by clinical staff.  Assessment and Plan   Patient Instructions   Chronic Rhinitis - seasonal and perennial allergic rhinitis: Partially controlled - allergy testing today was positive to grasses, trees, fusarium mold, cats, dogs, mice - allergen avoidance as below - consider allergy shots as long term control of your symptoms by teaching your immune system to be more tolerant of your allergy triggers - avoid nasal sprays due to history of nose bleeds -Consider nasal saline rinses as needed, this will washout pollen and help moisten your mucus - Start Singulair (Montelukast) 10mg  daily. - Continue over the counter antihistamine daily or daily as needed.   -Your options include Zyrtec (Cetirizine) 10mg , Claritin/Alleclear (Loratadine) 10mg , Allegra (Fexofenadine) 180mg , or Xyzal (Levocetirinze) 5mg   Allergic Conjunctivitis: Partially controlled - Start Allergy Eye drops: great options include Pataday (Olopatadine) or Zaditor (ketotifen) for eye symptoms daily as needed-both sold over the counter if not covered by insurance.   -Avoid eye drops that say red eye relief as  they may contain medications that dry out your eyes.  Mild Intermittent Asthma: Controlled - your lung testing did not show obstruction - Controller med: Singulair 10 mg daily. For respiratory illness: Start Alvesco 80, 2 puffs twice daily for at least one week or until symptoms resolve. Sample provided in clinic. - Rescue Inhaler: Albuterol (Proair/Ventolin) 2 puffs . Use  every 4-6 hours as needed for chest tightness, wheezing, or coughing.  Can also use 15 minutes prior to exercise if you have symptoms with activity. - Asthma is not controlled if:  - Symptoms are occurring >2 times a week OR  - >2 times a month nighttime awakenings  - You are requiring systemic steroids (prednisone/steroid injections) more than once per year  - Your require hospitalization for your asthma.  - Please call the clinic to schedule a follow up if these symptoms arise  Concerns regarding food allergy- Your  symptoms with gluten and spices are more consistent with an intolerance, avoidance is optional, but will help you avoid unwanted symptoms - allergy testing today showed negative to wheat, oat, rye, barley, karaya gum, acacia, (arabic gum), cinnamon, nutmeg, ginger, garlic, black pepper, mustar - consider elmination diets to help determine which spice is causing you to feel sleepy - tyramines are a known food trigger for migraines, you should avoid or eat in small quantities to prevent symptoms  Lactose Intolerance- This is not an allergy but rather a problem with breaking down dairy (due to an enzyme deficiency) which can lead to bloating, gas, diarrhea, nausea and vomiting - choose lactose free dairy or take lactaid prior to eating dairy products that contain lactose  Rash under armpits Suspect either heat/sweat related or more likely contact dermatitis - Okay to continue current deodorant (Lume) or Vanicream is another excellent option -If continue to have rash despite this, please contact us to set you up for patch testing -If symptomatic, continue using hydrocortisone 2.5% cream twice daily as needed until rash resolved  Follow-up in 2-3 months.  This note in its entirety was forwarded to the Provider who requested this consultation.  Thank you for your kind referral. I appreciate the opportunity to take part in Nikeisha's care. Please do not hesitate to contact me with questions.  Sincerely,  Sigurd Sos, MD [facility]

## 2021-10-30 ENCOUNTER — Ambulatory Visit: Payer: 59 | Admitting: Internal Medicine

## 2021-10-30 ENCOUNTER — Other Ambulatory Visit: Payer: Self-pay

## 2021-10-30 ENCOUNTER — Encounter: Payer: Self-pay | Admitting: Internal Medicine

## 2021-10-30 VITALS — BP 122/88 | HR 88 | Temp 98.1°F | Resp 18 | Ht 63.5 in | Wt 162.0 lb

## 2021-10-30 DIAGNOSIS — R21 Rash and other nonspecific skin eruption: Secondary | ICD-10-CM

## 2021-10-30 DIAGNOSIS — T781XXA Other adverse food reactions, not elsewhere classified, initial encounter: Secondary | ICD-10-CM | POA: Insufficient documentation

## 2021-10-30 DIAGNOSIS — J4520 Mild intermittent asthma, uncomplicated: Secondary | ICD-10-CM

## 2021-10-30 DIAGNOSIS — J302 Other seasonal allergic rhinitis: Secondary | ICD-10-CM | POA: Insufficient documentation

## 2021-10-30 DIAGNOSIS — H1013 Acute atopic conjunctivitis, bilateral: Secondary | ICD-10-CM

## 2021-10-30 DIAGNOSIS — T7819XA Other adverse food reactions, not elsewhere classified, initial encounter: Secondary | ICD-10-CM

## 2021-10-30 DIAGNOSIS — J3089 Other allergic rhinitis: Secondary | ICD-10-CM

## 2021-10-30 DIAGNOSIS — E739 Lactose intolerance, unspecified: Secondary | ICD-10-CM

## 2021-10-30 DIAGNOSIS — J310 Chronic rhinitis: Secondary | ICD-10-CM

## 2021-10-30 MED ORDER — MONTELUKAST SODIUM 10 MG PO TABS: 10.0000 mg | ORAL_TABLET | Freq: Every day | ORAL | 5 refills | Status: DC

## 2021-10-30 NOTE — Patient Instructions (Addendum)
Chronic Rhinitis - seasonal and perennial allergic rhinitis: - allergy testing today was positive to grasses, trees, fusarium mold, cats, dogs, mice - allergen avoidance as below - consider allergy shots as long term control of your symptoms by teaching your immune system to be more tolerant of your allergy triggers - avoid nasal sprays due to history of nose bleeds -Consider nasal saline rinses as needed, this will washout pollen and help moisten your mucus - Start Singulair (Montelukast) 10mg  daily. - Continue over the counter antihistamine daily or daily as needed.   -Your options include Zyrtec (Cetirizine) 10mg , Claritin/Alleclear (Loratadine) 10mg , Allegra (Fexofenadine) 180mg , or Xyzal (Levocetirinze) 5mg   Allergic Conjunctivitis: Partially controlled - Start Allergy Eye drops: great options include Pataday (Olopatadine) or Zaditor (ketotifen) for eye symptoms daily as needed-both sold over the counter if not covered by insurance.   -Avoid eye drops that say red eye relief as they may contain medications that dry out your eyes.  Mild Intermittent Asthma: Controlled - your lung testing did not show obstruction - Controller med: Singulair 10 mg daily. For respiratory illness: Start Alvesco 80, 2 puffs twice daily for at least one week or until symptoms resolve. Sample provided in clinic. - Rescue Inhaler: Albuterol (Proair/Ventolin) 2 puffs . Use  every 4-6 hours as needed for chest tightness, wheezing, or coughing.  Can also use 15 minutes prior to exercise if you have symptoms with activity. - Asthma is not controlled if:  - Symptoms are occurring >2 times a week OR  - >2 times a month nighttime awakenings  - You are requiring systemic steroids (prednisone/steroid injections) more than once per year  - Your require hospitalization for your asthma.  - Please call the clinic to schedule a follow up if these symptoms arise  Concerns regarding food allergy Your symptoms with gluten and  spices are more consistent with an intolerance, avoidance is optional, but will help you avoid unwanted symptoms - allergy testing today showed negative to wheat, oat, rye, barley, karaya gum, acacia, (arabic gum), cinnamon, nutmeg, ginger, garlic, black pepper, mustar - consider elmination diets to help determine which spice is causing you to feel sleepy - tyramines are a known food trigger for migraines, you should avoid or eat in small quantities to prevent symptoms  Lactose Intolerance- This is not an allergy but rather a problem with breaking down dairy (due to an enzyme deficiency) which can lead to bloating, gas, diarrhea, nausea and vomiting - choose lactose free dairy or take lactaid prior to eating dairy products that contain lactose  Rash under armpits Suspect either heat/sweat related or more likely contact dermatitis - Okay to continue current deodorant (Lume) or Vanicream is another excellent option -If continue to have rash despite this, please contact us to set you up for patch testing -If symptomatic, continue using hydrocortisone 2.5% cream twice daily as needed until rash resolved  Follow-up in 2-3 months.  Reducing Pollen Exposure  The American Academy of Allergy, Asthma and Immunology suggests the following steps to reduce your exposure to pollen during allergy seasons.    Do not hang sheets or clothing out to dry; pollen may collect on these items. Do not mow lawns or spend time around freshly cut grass; mowing stirs up pollen. Keep windows closed at night.  Keep car windows closed while driving. Minimize morning activities outdoors, a time when pollen counts are usually at their highest. Stay indoors as much as possible when pollen counts or humidity is high and on windy  days when pollen tends to remain in the air longer. Use air conditioning when possible.  Many air conditioners have filters that trap the pollen spores. Use a HEPA room air filter to remove pollen  form the indoor air you breathe.  Control of Dog or Cat Allergen  Avoidance is the best way to manage a dog or cat allergy. If you have a dog or cat and are allergic to dog or cats, consider removing the dog or cat from the home. If you have a dog or cat but don't want to find it a new home, or if your family wants a pet even though someone in the household is allergic, here are some strategies that may help keep symptoms at bay:  Keep the pet out of your bedroom and restrict it to only a few rooms. Be advised that keeping the dog or cat in only one room will not limit the allergens to that room. Don't pet, hug or kiss the dog or cat; if you do, wash your hands with soap and water. High-efficiency particulate air (HEPA) cleaners run continuously in a bedroom or living room can reduce allergen levels over time. Regular use of a high-efficiency vacuum cleaner or a central vacuum can reduce allergen levels. Giving your dog or cat a bath at least once a week can reduce airborne allergen.

## 2021-11-23 ENCOUNTER — Other Ambulatory Visit: Payer: Self-pay

## 2021-11-23 ENCOUNTER — Ambulatory Visit: Payer: 59 | Admitting: Podiatry

## 2021-11-23 DIAGNOSIS — M7751 Other enthesopathy of right foot: Secondary | ICD-10-CM | POA: Diagnosis not present

## 2021-11-23 DIAGNOSIS — M779 Enthesopathy, unspecified: Secondary | ICD-10-CM | POA: Diagnosis not present

## 2021-11-26 ENCOUNTER — Encounter: Payer: Self-pay | Admitting: Podiatry

## 2021-11-26 NOTE — Progress Notes (Signed)
Subjective: 61 year old female presents the office today for follow-up evaluation of ankle pain on both sides.  Overall she states that she is doing about the same.  The injections are helpful into them.  She has had discomfort with inversion, eversion that she demonstrates.  She states that she does get discomfort in her ankle particularly if she were to go to [facility] and walking the store to go shopping on hard surfaces aggravates her symptoms as well.  She states that her feet, back started to hurt.  No recent injury or changes noted.  Objective: AAO x3, NAD DP/PT pulses palpable bilaterally, CRT less than 3 seconds There is tenderness palpation on sinus tarsi on the right side.  No significant discomfort in the sinus tarsi today.  Similar discomfort on the course of the peroneal tendons.  Trace edema bilaterally.  No erythema.  There is no area of pinpoint tenderness.  No open lesions or pre-ulcerative lesions.  No pain with calf compression, swelling, warmth, erythema  Assessment: 61 year old female with capsulitis, tendinitis  Plan: -All treatment options discussed with the patient including all alternatives, risks, complications.  -At this point he tried numerous treatments.  She seems to have plateaued.  We will try to work on alternative treatments including acupuncture, medical/orthopedic massage.  In the meantime, continue with shoes and good arch supports.  We will hold off on further steroid injections for now.  Trula Slade DPM

## 2021-11-27 ENCOUNTER — Ambulatory Visit: Payer: 59 | Admitting: Internal Medicine

## 2021-11-29 ENCOUNTER — Telehealth: Payer: Self-pay | Admitting: Internal Medicine

## 2021-11-29 ENCOUNTER — Other Ambulatory Visit: Payer: Self-pay

## 2021-11-29 MED ORDER — ALVESCO 80 MCG/ACT IN AERS: 2.0000 | INHALATION_SPRAY | Freq: Two times a day (BID) | RESPIRATORY_TRACT | 5 refills | Status: DC

## 2021-11-29 NOTE — Telephone Encounter (Signed)
Pt has a hx of depression. The week after we seen her she got really sick and very depressed. Pt stated you had asked during he visit if she had a hx of depression she said she told you no. But looking into montelukast more and seeing the black box warning she is concerned for taking it and wants to know if there is something else she could take in it place?

## 2021-11-29 NOTE — Telephone Encounter (Signed)
Pt states she doesn't think she should take singulair. Would like a call back to discuss.

## 2021-11-29 NOTE — Telephone Encounter (Signed)
Called and left a message for Anamika to call our office back

## 2021-11-29 NOTE — Telephone Encounter (Signed)
Please call patient back.  Singulair is a a medication that helps with allergies and asthma. There is a very small risk of mood changes - this is usually seen in children rather than adults. However, if she's concerned about it then don't take it.  Use over the counter antihistamines such as Zyrtec (cetirizine), Claritin (loratadine), Allegra (fexofenadine), or Xyzal (levocetirizine) daily as needed for the allergies.  If you have issues with your asthma then: Start Alvesco 80, 2 puffs twice daily for at least one week or until symptoms resolve.   Discuss further with Dr. Simona Huh at next [place].

## 2021-11-29 NOTE — Telephone Encounter (Signed)
Spoke with pt informed her of what dr Maudie Mercury stated and sent in the [facility]. Pt stated understanding and will let us know if she has any questions

## 2021-11-30 NOTE — Telephone Encounter (Signed)
Pa submitted thru cover my meds for alvesco waiting on response from insurance

## 2021-12-01 ENCOUNTER — Telehealth: Payer: Self-pay | Admitting: *Deleted

## 2021-12-01 MED ORDER — ALVESCO 80 MCG/ACT IN AERS: 2.0000 | INHALATION_SPRAY | Freq: Two times a day (BID) | RESPIRATORY_TRACT | 5 refills | Status: DC

## 2021-12-01 NOTE — Telephone Encounter (Signed)
Is Alvesco not supposed to go to [facility] in [place]? Can we try sending the prescription there to see if it is covered?  Thank you, Althea Charon, FNP

## 2021-12-01 NOTE — Telephone Encounter (Signed)
Yes it is! Sent Alvesco to [facility]. I will call them to verify and let pt know.

## 2021-12-01 NOTE — Telephone Encounter (Signed)
Received fax from [facility] not covered. Preferred alternatives are Flovent, Arnuity, Asmanex. Please advise.

## 2021-12-01 NOTE — Telephone Encounter (Signed)
[facility] states the Pt will need to reach out to them- it will be $50 for 2 inhalers. Tanya Peterson has submitted a PA for [facility]. Tanya Peterson states pt only wanted Rx to go to [place] spoke with pt yesterday to let the pt know a PA was submitted.

## 2021-12-06 NOTE — Telephone Encounter (Signed)
Left a message for pt to return my call. Need to clarify regarding her medications.

## 2021-12-06 NOTE — Telephone Encounter (Signed)
Pa was denied as it is not a covered drug and not on formulary. Pts insurance prefers arnuity ellipta, asmanex hfa or twishaler, flovent HFA or diskus. Please advise to change in inhalers. Thank you  I have left message for pt to call us back about this matter.

## 2021-12-06 NOTE — Telephone Encounter (Signed)
Please call to clairify with patient.  We had started Singulair for allergic rhinitis.   She does have asthma, but it was intermittent-and she was not needing rescue inhaler frequently.  The Alvesco was only to be used during respiratory illness until symptoms resolved or for at least one week.  Is she wanting another medication for her nasal symptoms?  If so, flonase or azelastine would work great and those are both over counter.  Since she has Pharmacist, community, doubt coverage.  For her asthma, we can send in Flovent HFA to be used in place of Alvesco (2 puffs twice daily) during respiratory illness.   Thanks!

## 2021-12-08 MED ORDER — FLUTICASONE PROPIONATE HFA 110 MCG/ACT IN AERO: 2.0000 | INHALATION_SPRAY | Freq: Two times a day (BID) | RESPIRATORY_TRACT | 5 refills | Status: DC

## 2021-12-08 NOTE — Telephone Encounter (Signed)
Pt is not doing montelukast. She is doing the rescue often specially during the winter. She has done the sample of alvesco and it has helped her.

## 2021-12-08 NOTE — Telephone Encounter (Signed)
Informed pt of the change and she stated the understanding and verified the pharmacy

## 2021-12-08 NOTE — Addendum Note (Signed)
Addended by: Berniece Andreas L on: 12/08/2021 04:00 PM   Modules accepted: Orders

## 2021-12-29 NOTE — Progress Notes (Signed)
FOLLOW UP Date of Service/Encounter:  01/01/22   Subjective:  Tanya Peterson (DOB: [birth year]) is a 61 y.o. female PMHx of asthma, ADHD, bilateral carotid artery stenosis, bipolar disease, fibromuscular dysplasia, anxiety, migraines, obesity, osteopenia, sleep apnea who returns to the [facility] on 01/01/2022 in re-evaluation of the following: Seasonal and perennial allergic rhinitis and conjunctivitis, mild intermittent asthma, concern for food allergy, lactose intolerant History obtained from: chart review and patient.  For Review, LV was on 10/30/2021 with Dr.Chere Babson.  At that visit we discussed consideration of allergy shots, nasal saline rinses.  We started Singulair and continued antihistamines.  Restarted allergy eyedrops.  We gave Alvesco 80 to be used 2 puffs twice a day and block therapy during respiratory illness.  We have ruled out food allergy and discussed that her symptoms were more likely related to intolerance. She did have a rash under her armpit of unclear etiology.  Discussed potential for patch testing if no improvement with therapy plan as discussed at that clinic appointment.  Pertinent History/Diagnostics:  - Asthma: mild intermittent  - 10/30/21 spirometry: ratio 115%, 1.60L, 64% predicted FEV1  --  AEC (08/30/2021): 200 -CXR on (02/28/17): impression-normal, reviewed by me personally, no acute pulmonary abnormalities noted - Allergic Rhinitis and conjunctive:   - SPT environmental panel (10/31/2019): + grasses, trees, fusarium mold, cats, dogs, mice  - avoiding nasal sprays due to history of nosebleeds - Food intolerance (gluten and spices)  - Hx of reaction: Extreme sleepiness  - SPT select foods (10/30/2021): Negative to wheat, barley, oat, rye, karaya gum, Acacia, cinnamon, nutmeg, ginger, garlic, pepper, mustard  Today presents for follow-up.   She has been doing okay except that recently ate food with tyramine in it and she had tachycardia and felt  like her throat felt really tight.  She called her friend on the phone who tried to talk her down.  She told her that her voice sounded fine.  Allie reports taking 3 Ativan which finally calmed her down.   Her symptoms did resolve within 45 minutes to 60 minutes. She has been very careful to avoid any foods that she feels may be a contributor to her symptoms.  She reports not having a reaction that severe in many years.  She did not seek medical care during this most recent reaction.  Regarding her breathing, she has been using her Alvesco 1 puff twice a day.   She did get covid toward the end of December and has symptoms for about one month.   She has only used albuterol once since her last visit.  This is much better than prior to her last visit Not taking singulair (montelukast) because of concerns for side effects.  This medication was never started.  Otherwise rhinitis symptoms have been stable. She does report recurrent nosebleeds though these usually only last for a few minutes.  However, they are happening frequently (several times per week)  Allergies as of 01/01/2022       Reactions   Other Other (See Comments)   Glutein - puts to sleep -teramines - Elevated heart rate Glutein - puts to sleep -teramines - Elevated heart rate Glutein - puts to sleep -teramines - Elevated heart rate Glutein - puts to sleep -teramines - Elevated heart rate   Tyramine Palpitations   Cause Tachycardia   Lactose Diarrhea, Other (See Comments)   No Known Allergies    Lactulose Other (See Comments)   Upset stomach Upset stomach Upset stomach  Neosporin [neomycin-bacitracin Zn-polymyx] Rash        Medication List        Accurate as of January 01, 2022  2:19 PM. If you have any questions, ask your nurse or doctor.          acetaminophen 325 MG tablet Commonly known as: TYLENOL Take 650 mg by mouth every 6 (six) hours as needed.   albuterol 108 (90 Base) MCG/ACT inhaler Commonly  known as: VENTOLIN HFA Inhale 1 puff into the lungs every 30 (thirty) minutes as needed for wheezing or shortness of breath.   Alvesco 80 MCG/ACT inhaler Generic drug: ciclesonide Inhale 2 puffs into the lungs 2 (two) times daily.   amphetamine-dextroamphetamine 10 MG 24 hr capsule Commonly known as: ADDERALL XR dextroamphetamine-amphetamine ER 10 mg 24hr capsule,extend release   aspirin EC 81 MG tablet Take 1 tablet (81 mg total) by mouth daily.   Biotin 5 MG Caps Take by mouth.   CALCIUM PO Take by mouth as directed.   COLLAGEN PO Take by mouth.   EPINEPHrine 0.3 mg/0.3 mL Soaj injection Commonly known as: EpiPen 2-Pak Inject 0.3 mg into the muscle once for 1 dose. Started by: Sigurd Sos, MD   Estradiol 10 MCG Tabs vaginal tablet Place 1 tablet vaginally 2 (two) times a week.   fluticasone 110 MCG/ACT inhaler Commonly known as: Flovent HFA Inhale 2 puffs into the lungs 2 (two) times daily.   gabapentin 100 MG capsule Commonly known as: NEURONTIN Take by mouth 2 (two) times daily.   Garlic 4270 MG Caps Take by mouth.   GRAPE SEED PO Take by mouth. Grape seed with resveratrol   hydrocortisone 2.5 % ointment Apply topically.   ibuprofen 800 MG tablet Commonly known as: ADVIL Take by mouth.   KRILL OIL PO Take by mouth as directed.   LORazepam 0.5 MG tablet Commonly known as: ATIVAN Take 0.5 mg by mouth. As needed.   Magnesium 400 MG Caps Take by mouth.   magnesium oxide 400 MG tablet Commonly known as: MAG-OX Take 400 mg by mouth daily.   Melatonin 5 MG Caps Take by mouth.   montelukast 10 MG tablet Commonly known as: Singulair Take 1 tablet (10 mg total) by mouth at bedtime.   nitroGLYCERIN 0.2 mg/hr patch Commonly known as: Nitro-Dur Place 1 patch (0.2 mg total) onto the skin daily.   NON FORMULARY C-Medicare scar cream--Use as directed   triamcinolone ointment 0.5 % Commonly known as: KENALOG Apply topically.   Turmeric 500 MG  Tabs Take 1,000 Units by mouth.   vitamin B-12 1000 MCG tablet Commonly known as: CYANOCOBALAMIN Take 1,000 mcg by mouth daily.   vitamin C 1000 MG tablet Take 1,000 mg by mouth daily.   Vitamin D3 25 MCG (1000 UT) Caps Take by mouth daily.   vitamin E 180 MG (400 UNITS) capsule Take 400 Units by mouth daily.   Zinc 50 MG Caps Take by mouth.       Past Medical History:  Diagnosis Date   ADHD (attention deficit hyperactivity disorder)    Asthma    rarely uses inhaler   Bipolar affective disorder ([place])    Complication of anesthesia    Depression    Fibromuscular dysplasia of cervicocranial artery (HCC)    History of kidney stones    Panic attacks    PONV (postoperative nausea and vomiting)    Sleep disorder    tx with nuvigal   Past Surgical History:  Procedure Laterality Date  BREAST EXCISIONAL BIOPSY Right 10/02/2017   papilloma   BREAST LUMPECTOMY WITH RADIOACTIVE SEED LOCALIZATION Right 10/02/2017   Procedure: RIGHT BREAST LUMPECTOMY WITH RADIOACTIVE SEED LOCALIZATION ERAS PATHWAY;  Surgeon: Jovita Kussmaul, MD;  Location: [facility];  Service: General;  Laterality: Right;   COLONOSCOPY WITH PROPOFOL N/A 01/11/2015   Procedure: COLONOSCOPY WITH PROPOFOL;  Surgeon: Garlan Fair, MD;  Location: [facility];  Service: Endoscopy;  Laterality: N/A;   HYSTEROSCOPY WITH D & C N/A 03/05/2016   Procedure: DILATATION AND CURETTAGE /HYSTEROSCOPY;  Surgeon: Vanessa Kick, MD;  Location: [facility];  Service: Gynecology;  Laterality: N/A;   kdiney stone surgery  12/2015   LIPOSUCTION     TENDON REPAIR Left 06/20/2016   foot   WISDOM TOOTH EXTRACTION     Otherwise, there have been no changes to her past medical history, surgical history, family history, or social history.  ROS: All others negative except as noted per HPI.   Objective:  BP 122/84    Pulse 88    Temp 98.1 F (36.7 C) (Temporal)    Resp 18    SpO2 97%  There is no height or weight on file to  calculate BMI. Physical Exam: General Appearance:  Alert, cooperative, no distress, appears stated age  Head:  Normocephalic, without obvious abnormality, atraumatic  Eyes:  Conjunctiva clear, EOM's intact  Nose: Nares normal, hypertrophic turbinates, normal mucosa, no visible anterior polyps, and septum midline, no prominent vessels noted  Throat: Lips, tongue normal; teeth and gums normal, normal posterior oropharynx and tonsils 2+  Neck: Supple, symmetrical  Lungs:   clear to auscultation bilaterally, Respirations unlabored, no coughing  Heart:  regular rate and rhythm and no murmur, Appears well perfused  Extremities: No edema  Skin: Skin color, texture, turgor normal, no rashes or lesions on visualized portions of skin  Neurologic: No gross deficits  Spirometry:  Tracings reviewed. Her effort: It was hard to get consistent efforts and there is a question as to whether this reflects a maximal maneuver. FVC: 1.81L FEV1: 1.35L, 54% predicted FEV1/FVC ratio: 95% Interpretation:  No obstruction, possible restriction but suspect results mostly affected by poor effort .  Please see scanned spirometry results for details.  Assessment/Plan   Patient Instructions  Seasonal and perennial allergic rhinitis: controlled - allergen avoidance towards grasses, trees, fusarium mold, cats, dogs, mice - consider allergy shots as long term control of your symptoms by teaching your immune system to be more tolerant of your allergy triggers - avoid nasal sprays due to history of nose bleeds, consider ENT if continuing to have recurrent nose bleeds -Consider nasal saline rinses as needed, this will washout pollen and help moisten your mucus.  Will help with nosebleeds - Continue over the counter antihistamine daily or daily as needed.   -Your options include Zyrtec (Cetirizine) 10mg , Claritin/Alleclear (Loratadine) 10mg , Allegra (Fexofenadine) 180mg , or Xyzal (Levocetirinze) 5mg   Allergic  Conjunctivitis: - Continue Allergy Eye drops: great options include Pataday (Olopatadine) or Zaditor (ketotifen) for eye symptoms daily as needed-both sold over the counter if not covered by insurance.   -Avoid eye drops that say red eye relief as they may contain medications that dry out your eyes.  Mild Intermittent Asthma: Controlled - your lung testing did not show obstruction - Controller med: Alvesco 80, 1 puff twice daily.    For respiratory illness/asthma flare: Can increase to 2 puffs twice daily if having increased respiratory symptoms (for example, if you get a cold, notice  you are using your albuterol more frequently, etc) - use at this dose for 2 weeks or until symptoms resolve. - Rescue Inhaler: Albuterol (Proair/Ventolin) 2 puffs . Use  every 4-6 hours as needed for chest tightness, wheezing, or coughing.  Can also use 15 minutes prior to exercise if you have symptoms with activity. - Asthma is not controlled if:  - Symptoms are occurring >2 times a week OR  - >2 times a month nighttime awakenings  - You are requiring systemic steroids (prednisone/steroid injections) more than once per year  - Your require hospitalization for your asthma.  - Please call the clinic to schedule a follow up if these symptoms arise  Concerns regarding food allergy Your symptoms with gluten and spices are more consistent with an intolerance, avoidance is optional, but will help you avoid unwanted symptoms - allergy testing previously negative to wheat, oat, rye, barley, karaya gum, acacia, (arabic gum), cinnamon, nutmeg, ginger, garlic, black pepper, mustar - consider elmination diets to help determine which spice is causing you to feel sleepy - tyramines are a known food trigger for migraines, you should avoid or eat in small quantities to prevent symptoms  History of sensation of throat tightening after eating tyramines:  - suspect intolerance, but will prescribe epinephrine autoinjector to be  used in event of airway symptoms (throat swelling, trouble breathing, coughing, wheezing) following food ingestion - please seek immediate medical attention if using epinephrine autoinjector - please ask for a tryptase level to be drawn during a reaction if recurs  Lactose Intolerance- This is not an allergy but rather a problem with breaking down dairy (due to an enzyme deficiency) which can lead to bloating, gas, diarrhea, nausea and vomiting - choose lactose free dairy or take lactaid prior to eating dairy products that contain lactose   Follow-up in 6 months, sooner if needed.    Sigurd Sos, MD  [facility]

## 2022-01-01 ENCOUNTER — Encounter: Payer: Self-pay | Admitting: Internal Medicine

## 2022-01-01 ENCOUNTER — Other Ambulatory Visit: Payer: Self-pay

## 2022-01-01 ENCOUNTER — Ambulatory Visit: Payer: 59 | Admitting: Internal Medicine

## 2022-01-01 VITALS — BP 122/84 | HR 88 | Temp 98.1°F | Resp 18

## 2022-01-01 DIAGNOSIS — R0602 Shortness of breath: Secondary | ICD-10-CM | POA: Diagnosis not present

## 2022-01-01 DIAGNOSIS — J4520 Mild intermittent asthma, uncomplicated: Secondary | ICD-10-CM

## 2022-01-01 MED ORDER — EPINEPHRINE 0.3 MG/0.3ML IJ SOAJ: 0.3000 mg | Freq: Once | INTRAMUSCULAR | 2 refills | Status: AC

## 2022-01-01 NOTE — Patient Instructions (Addendum)
Seasonal and perennial allergic rhinitis: controlled - allergen avoidance towards grasses, trees, fusarium mold, cats, dogs, mice - consider allergy shots as long term control of your symptoms by teaching your immune system to be more tolerant of your allergy triggers - avoid nasal sprays due to history of nose bleeds, consider ENT if continuing to have recurrent nose bleeds -Consider nasal saline rinses as needed, this will washout pollen and help moisten your mucus.  Will help with nosebleeds - Continue over the counter antihistamine daily or daily as needed.   -Your options include Zyrtec (Cetirizine) 10mg , Claritin/Alleclear (Loratadine) 10mg , Allegra (Fexofenadine) 180mg , or Xyzal (Levocetirinze) 5mg   Allergic Conjunctivitis: - Continue Allergy Eye drops: great options include Pataday (Olopatadine) or Zaditor (ketotifen) for eye symptoms daily as needed-both sold over the counter if not covered by insurance.   -Avoid eye drops that say red eye relief as they may contain medications that dry out your eyes.  Mild Intermittent Asthma: Controlled - your lung testing did not show obstruction - Controller med: Alvesco 80, 1 puff twice daily.    For respiratory illness/asthma flare: Can increase to 2 puffs twice daily if having increased respiratory symptoms (for example, if you get a cold, notice you are using your albuterol more frequently, etc) - use at this dose for 2 weeks or until symptoms resolve. - Rescue Inhaler: Albuterol (Proair/Ventolin) 2 puffs . Use  every 4-6 hours as needed for chest tightness, wheezing, or coughing.  Can also use 15 minutes prior to exercise if you have symptoms with activity. - Asthma is not controlled if:  - Symptoms are occurring >2 times a week OR  - >2 times a month nighttime awakenings  - You are requiring systemic steroids (prednisone/steroid injections) more than once per year  - Your require hospitalization for your asthma.  - Please call the clinic to  schedule a follow up if these symptoms arise  Concerns regarding food allergy Your symptoms with gluten and spices are more consistent with an intolerance, avoidance is optional, but will help you avoid unwanted symptoms - allergy testing previously negative to wheat, oat, rye, barley, karaya gum, acacia, (arabic gum), cinnamon, nutmeg, ginger, garlic, black pepper, mustar - consider elmination diets to help determine which spice is causing you to feel sleepy - tyramines are a known food trigger for migraines, you should avoid or eat in small quantities to prevent symptoms  History of sensation of throat tightening after eating tyramines:  - suspect intolerance, but will prescribe epinephrine autoinjector to be used in event of airway symptoms (throat swelling, trouble breathing, coughing, wheezing) following food ingestion - please seek immediate medical attention if using epinephrine autoinjector - please ask for a tryptase level to be drawn during a reaction if recurs  Lactose Intolerance- This is not an allergy but rather a problem with breaking down dairy (due to an enzyme deficiency) which can lead to bloating, gas, diarrhea, nausea and vomiting - choose lactose free dairy or take lactaid prior to eating dairy products that contain lactose   Follow-up in 6 months, sooner if needed.

## 2022-01-10 ENCOUNTER — Telehealth: Payer: Self-pay | Admitting: Cardiology

## 2022-01-10 ENCOUNTER — Encounter: Payer: Self-pay | Admitting: Cardiology

## 2022-01-10 NOTE — Telephone Encounter (Signed)
STAT if HR is under 50 or over 120 (normal HR is 60-100 beats per minute)  What is your heart rate? Not sure what it is now   Do you have a log of your heart rate readings (document readings)? 40 at night, states atv rate is 25  Do you have any other symptoms? no   Patient states she is not sure if it is anxiety, but her HR has been very low. She says at night it will go down to 40. She says she is not sure what it is during the day, because her watch shows an average. She says she also has been SOB, but has asthma so she has been using her inhaler. She was not SOB on the phone.

## 2022-01-10 NOTE — Telephone Encounter (Signed)
LMTCB

## 2022-01-10 NOTE — Telephone Encounter (Signed)
Spoke with patient who reports heart rate fluctuating form 40s to 114. She sent in her rhythm strips to my chart. She stated she got anxious and took double her ativan dose. During one night, her heart dropped to 40; she denied chest pain or sob at that time. She spoke very rapidly while on the phone. She stated she had a brief pain behind left knee and got anxious and worried it was a blood clot. Scheduled her for Dr. Stanford Breed for 3/28. Her concern is heart rate/rhythm.

## 2022-01-11 NOTE — Telephone Encounter (Signed)
Patient informed there are no arrhythmias on rhythm stirps. I explained to patient that the rhythms she sent in did not have any irregular heart beats. Patient had no other questions or concerns.

## 2022-01-16 ENCOUNTER — Ambulatory Visit: Payer: 59 | Admitting: Podiatry

## 2022-01-24 ENCOUNTER — Other Ambulatory Visit: Payer: Self-pay

## 2022-01-24 MED ORDER — ALVESCO 80 MCG/ACT IN AERS: 2.0000 | INHALATION_SPRAY | Freq: Two times a day (BID) | RESPIRATORY_TRACT | 5 refills | Status: DC

## 2022-01-30 ENCOUNTER — Other Ambulatory Visit: Payer: Self-pay

## 2022-01-30 ENCOUNTER — Ambulatory Visit: Payer: 59 | Admitting: Podiatry

## 2022-01-30 DIAGNOSIS — M7751 Other enthesopathy of right foot: Secondary | ICD-10-CM | POA: Diagnosis not present

## 2022-01-30 DIAGNOSIS — M779 Enthesopathy, unspecified: Secondary | ICD-10-CM

## 2022-02-02 NOTE — Progress Notes (Signed)
? ? ? ? ?HPI: FU palpitations. Seen in the emergency room June 2017 with palpitations. ECG with sinus tachycardia and nonspecific ST changes. 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 Echocardiogram August 2017 showed normal LV systolic function. Exercise treadmill May 2018 was normal.  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  Carotid Dopplers November 2021 near normal bilaterally.  Since last seen she does have some dyspnea on exertion.  No orthopnea, PND, chest pain or syncope.  She describes occasional minimal pedal edema.  She continues to have occasional fluttering unchanged. ? ?Current Outpatient Medications  ?Medication Sig Dispense Refill  ? acetaminophen (TYLENOL) 325 MG tablet Take 650 mg by mouth every 6 (six) hours as needed.    ? albuterol (PROVENTIL HFA;VENTOLIN HFA) 108 (90 BASE) MCG/ACT inhaler Inhale 1 puff into the lungs every 30 (thirty) minutes as needed for wheezing or shortness of breath.    ? amphetamine-dextroamphetamine (ADDERALL XR) 10 MG 24 hr capsule dextroamphetamine-amphetamine ER 10 mg 24hr capsule,extend release    ? Ascorbic Acid (VITAMIN C) 1000 MG tablet Take 1,000 mg by mouth daily.    ? aspirin EC 81 MG tablet Take 1 tablet (81 mg total) by mouth daily. 30 tablet 1  ? Bioflavonoid Products (GRAPE SEED PO) Take by mouth. Grape seed with resveratrol    ? Biotin 5 MG CAPS Take by mouth.    ? CALCIUM PO Take by mouth as directed.    ? Cholecalciferol (VITAMIN D3) 1000 units CAPS Take by mouth daily.    ? ciclesonide (ALVESCO) 80 MCG/ACT inhaler Inhale 2 puffs into the lungs 2 (two) times daily. 1 each 5  ? COLLAGEN PO Take by mouth.    ? Estradiol 10 MCG TABS vaginal tablet Place 1 tablet vaginally 2 (two) times a week.    ? fluticasone (FLOVENT HFA) 110 MCG/ACT inhaler Inhale 2 puffs into the lungs 2 (two) times daily. 1 each 5  ? gabapentin (NEURONTIN) 100 MG  capsule Take 100 mg by mouth 3 (three) times daily. Pt takes 2 tablets,200 mg in the morning, and 2 tablets,200 mg in the midday and 3 tablets,300 mg at night.    ? Garlic 2952 MG CAPS Take by mouth.    ? hydrocortisone 2.5 % ointment Apply topically.    ? ibuprofen (ADVIL) 800 MG tablet Take by mouth.    ? KRILL OIL PO Take by mouth as directed.    ? LORazepam (ATIVAN) 0.5 MG tablet Take 0.5 mg by mouth. As needed.    ? Magnesium 400 MG CAPS Take by mouth.    ? magnesium oxide (MAG-OX) 400 MG tablet Take 400 mg by mouth daily.    ? Melatonin 5 MG CAPS Take by mouth.    ? montelukast (SINGULAIR) 10 MG tablet Take 1 tablet (10 mg total) by mouth at bedtime. 30 tablet 5  ? nitroGLYCERIN (NITRO-DUR) 0.2 mg/hr patch Place 1 patch (0.2 mg total) onto the skin daily. 30 patch 12  ? NON FORMULARY C-Medicare scar cream--Use as directed    ? triamcinolone ointment (KENALOG) 0.5 % Apply topically.    ? Turmeric 500 MG TABS Take 1,000 Units by mouth.    ? vitamin B-12 (CYANOCOBALAMIN) 1000 MCG tablet Take 1,000 mcg by mouth daily.    ? vitamin E 180 MG (400 UNITS) capsule Take 400 Units by mouth daily.    ? Zinc 50 MG CAPS Take by mouth.    ? ?  No current facility-administered medications for this visit.  ? ? ? ?Past Medical History:  ?Diagnosis Date  ? ADHD (attention deficit hyperactivity disorder)   ? Asthma   ? rarely uses inhaler  ? Bipolar affective disorder ([place])   ? Complication of anesthesia   ? Depression   ? Fibromuscular dysplasia of cervicocranial artery (HCC)   ? History of kidney stones   ? Panic attacks   ? PONV (postoperative nausea and vomiting)   ? Sleep disorder   ? tx with nuvigal  ? ? ?Past Surgical History:  ?Procedure Laterality Date  ? BREAST EXCISIONAL BIOPSY Right 10/02/2017  ? papilloma  ? BREAST LUMPECTOMY WITH RADIOACTIVE SEED LOCALIZATION Right 10/02/2017  ? Procedure: RIGHT BREAST LUMPECTOMY WITH RADIOACTIVE SEED LOCALIZATION ERAS PATHWAY;  Surgeon: Jovita Kussmaul, MD;  Location: [facility];  Service: General;  Laterality: Right;  ? COLONOSCOPY WITH PROPOFOL N/A 01/11/2015  ? Procedure: COLONOSCOPY WITH PROPOFOL;  Surgeon: Garlan Fair, MD;  Location: [facility];  Service: Endoscopy;  Laterality: N/A;  ? HYSTEROSCOPY WITH D & C N/A 03/05/2016  ? Procedure: DILATATION AND CURETTAGE /HYSTEROSCOPY;  Surgeon: Vanessa Kick, MD;  Location: [facility];  Service: Gynecology;  Laterality: N/A;  ? kdiney stone surgery  12/2015  ? LIPOSUCTION    ? TENDON REPAIR Left 06/20/2016  ? foot  ? WISDOM TOOTH EXTRACTION    ? ? ?Social History  ? ?Socioeconomic History  ? Marital status: Single  ?  Spouse name: Not on file  ? Number of children: Not on file  ? Years of education: Not on file  ? Highest education level: Not on file  ?Occupational History  ? Occupation: Freight forwarder at [employer]  ?Tobacco Use  ? Smoking status: Never  ? Smokeless tobacco: Never  ?Substance and Sexual Activity  ? Alcohol use: No  ? Drug use: No  ? Sexual activity: Yes  ?  Birth control/protection: Post-menopausal  ?Other Topics Concern  ? Not on file  ?Social History Narrative  ? Not on file  ? ?Social Determinants of Health  ? ?Financial Resource Strain: Not on file  ?Food Insecurity: Not on file  ?Transportation Needs: Not on file  ?Physical Activity: Not on file  ?Stress: Not on file  ?Social Connections: Not on file  ?Intimate Partner Violence: Not on file  ? ? ?Family History  ?Problem Relation Age of Onset  ? Alzheimer's disease Mother   ? Alzheimer's disease Father   ? Breast cancer Paternal Aunt   ? ? ?ROS: no fevers or chills, productive cough, hemoptysis, dysphasia, odynophagia, melena, hematochezia, dysuria, hematuria, rash, seizure activity, orthopnea, PND, pedal edema, claudication. Remaining systems are negative. ? ?Physical Exam: ?Well-developed well-nourished in no acute distress.  ?Skin is warm and dry.  ?HEENT is normal.  ?Neck is supple.  ?Chest is clear to auscultation with normal expansion.  ?Cardiovascular exam is  regular rate and rhythm.  ?Abdominal exam nontender or distended. No masses palpated. ?Extremities show no edema. ?neuro grossly intact ? ?A/P ? ?1 palpitations-symptoms are reasonly well controlled.  We will consider addition of beta-blocker in the future if needed. ? ?2 history of chest pain-no recurrent symptoms since last office visit.  Previous stress test unrevealing. ? ?3 fibromuscular dysplasia-most recent carotid Dopplers November 2021 near normal. ? ?Kirk Ruths, MD ? ? ? ?

## 2022-02-02 NOTE — Progress Notes (Signed)
Subjective: ?61 year old female presents the office today for follow-up evaluation of ankle pain on both sides.  Since I last saw her she has followed up with Tharon Aquas for neuromuscular massage.  She states that this has been helpful and she is asked not doing cold lasering.  She states that the range of motion has improved some since this but she still has discomfort point the lateral aspect of the ankles.  No new injuries or concerns otherwise. ? ?Objective: ?AAO x3, NAD ?DP/PT pulses palpable bilaterally, CRT less than 3 seconds ?There is tenderness palpation on sinus tarsi on the right side.  No significant discomfort in the left side.  On the right side there is mild swelling of the course of the peroneal tendon as well.  The tendon does not appear to be subluxing at this time.  There is no area of pinpoint tenderness. ?No open lesions or pre-ulcerative lesions.  ?No pain with calf compression, swelling, warmth, erythema ? ?Assessment: ?61 year old female with capsulitis, tendinitis ? ?Plan: ?-All treatment options discussed with the patient including all alternatives, risks, complications.  ?-At this point we discussed continuing with the neuromuscular massage and arch supports.  I previously discussed repeat MRI but I do not think at this point can change the treatment plan as she is not interested in further surgery at this point.  Running continue with the therapy. ?-Compression to help with any residual swelling. ? ?Trula Slade DPM ?

## 2022-02-13 ENCOUNTER — Other Ambulatory Visit: Payer: Self-pay

## 2022-02-13 ENCOUNTER — Ambulatory Visit: Payer: 59 | Admitting: Cardiology

## 2022-02-13 ENCOUNTER — Encounter: Payer: Self-pay | Admitting: Cardiology

## 2022-02-13 VITALS — BP 110/84 | HR 101 | Ht 64.0 in | Wt 164.0 lb

## 2022-02-13 DIAGNOSIS — I773 Arterial fibromuscular dysplasia: Secondary | ICD-10-CM

## 2022-02-13 DIAGNOSIS — R072 Precordial pain: Secondary | ICD-10-CM

## 2022-02-13 DIAGNOSIS — R002 Palpitations: Secondary | ICD-10-CM

## 2022-02-13 NOTE — Patient Instructions (Signed)

## 2022-03-20 ENCOUNTER — Ambulatory Visit: Payer: 59 | Admitting: Podiatry

## 2022-03-20 DIAGNOSIS — M779 Enthesopathy, unspecified: Secondary | ICD-10-CM | POA: Diagnosis not present

## 2022-03-20 DIAGNOSIS — M7751 Other enthesopathy of right foot: Secondary | ICD-10-CM

## 2022-03-20 DIAGNOSIS — G8929 Other chronic pain: Secondary | ICD-10-CM

## 2022-03-22 NOTE — Progress Notes (Signed)
Subjective: ?62 year old female presents the office today for follow-up evaluation of ankle pain on both sides.  Since I saw her last she has been continue with the neuromuscular treatment with Tharon Aquas which has been helpful and she does get relief for a couple weeks after the treatments.  She only has a total aching sensation but overall symptoms are much improved.  Unfortunately she is not able to go on a regular basis.  She is asking for referral to pain management as she would like to have other medication aside from just anti-inflammatories.  She is having ongoing pain in her shoulder which she states is more severe than her ankles and her feet.  This has been ongoing since her injury which is caused her not to return to work. ? ?Objective: ?AAO x3, NAD ?DP/PT pulses palpable bilaterally, CRT less than 3 seconds ?There is mild tenderness palpation on sinus tarsi on the right side.  No significant pain to the area in the left side.  There is mild discomfort on the peroneal tendon posterior to the lateral malleolus on the right side.  There is no some edema there is no erythema or warmth.  There is no erythema. ?No pain with calf compression, swelling, warmth, erythema ? ?Assessment: ?61 year old female with capsulitis, tendinitis ? ?Plan: ?-All treatment options discussed with the patient including all alternatives, risks, complications.  ?-At this point we will continue with conservative care.  She is going for second opinion for her shoulder to [facility] later in the week. ?-Given her chronic pain I referred to pain management, [facility] ?-She is encouraged to follow-up with Tharon Aquas for now.  She may try to see someone different in order to have more consistent treatments. ?-Continue with supportive shoe gear, home rehab. ? ?Trula Slade DPM ? ?

## 2022-03-23 ENCOUNTER — Encounter: Payer: Self-pay | Admitting: Podiatry

## 2022-03-26 NOTE — Telephone Encounter (Signed)
Denny Peon, can you please re-fax? Thanks! ?

## 2022-05-25 ENCOUNTER — Encounter: Payer: Self-pay | Admitting: Podiatry

## 2022-05-25 NOTE — Telephone Encounter (Signed)
I called and spoke with her about her ankles. She has concerns over paperwork that was apparently sent to the office about her job/restrictions/if she is able to return to work. I am out of the office this week so I have not seen anything. I told her I would send a message to Levada Dy to see if she received anything.   Levada Dy- if you received this can you hold on to it until I see her Monday at her scheduled appointment? Thanks.

## 2022-05-29 ENCOUNTER — Ambulatory Visit (INDEPENDENT_AMBULATORY_CARE_PROVIDER_SITE_OTHER): Payer: Medicare HMO | Admitting: Podiatry

## 2022-05-29 DIAGNOSIS — M7751 Other enthesopathy of right foot: Secondary | ICD-10-CM

## 2022-05-29 DIAGNOSIS — G8929 Other chronic pain: Secondary | ICD-10-CM

## 2022-05-29 DIAGNOSIS — M779 Enthesopathy, unspecified: Secondary | ICD-10-CM

## 2022-06-01 NOTE — Progress Notes (Signed)
Subjective: 61 year old female presents the office today for follow-up evaluation of ankle pain on both sides.  She is describing sharp pain pointing from her hip going down to her leg.  This is also altering her gait which is causing ankle issues.  This seems to be more on the right side.  Overall she states that she is doing the same on both of her ankles.  She does follow-up with a spine doctor for her neck.  She is also been seen orthopedics for her shoulder.  Objective: AAO x3, NAD DP/PT pulses palpable bilaterally, CRT less than 3 seconds There is mild tenderness palpation on sinus tarsi on the right side.  Mild discomfort to the area in the left side.  There is mild discomfort on the peroneal tendon posterior to the lateral malleolus on the right side.  No gross ankle instability present.  No peroneal tendon appears to be sitting in anatomical position bilaterally.  There is no some edema there is no erythema or warmth.  There is no erythema. No pain with calf compression, swelling, warmth, erythema  Assessment: 61 year old female with capsulitis, tendinitis  Plan: -All treatment options discussed with the patient including all alternatives, risks, complications.  -Surgery is indicated at this time for her ankles.  We discussed the MRIs and further evaluation of this however clinically the peroneal tendons appear to be in anatomical position.  There is other factors also affecting her gait.  Due to this I do not think that she is able to return to work until the other issues are also figured out and she is more stable. -She has a spine doctor for her neck.  I advised her to talk to them about the pain running down her leg.  I do not think that is related to the ankle itself.  If needed I will refer her to neurology.  Trula Slade DPM

## 2022-06-07 ENCOUNTER — Ambulatory Visit: Payer: Medicare HMO | Admitting: Internal Medicine

## 2022-06-07 ENCOUNTER — Encounter: Payer: Self-pay | Admitting: Internal Medicine

## 2022-06-07 VITALS — BP 138/78 | HR 86 | Temp 98.6°F | Resp 18

## 2022-06-07 DIAGNOSIS — J189 Pneumonia, unspecified organism: Secondary | ICD-10-CM

## 2022-06-07 DIAGNOSIS — J302 Other seasonal allergic rhinitis: Secondary | ICD-10-CM

## 2022-06-07 DIAGNOSIS — H1013 Acute atopic conjunctivitis, bilateral: Secondary | ICD-10-CM

## 2022-06-07 DIAGNOSIS — J4520 Mild intermittent asthma, uncomplicated: Secondary | ICD-10-CM

## 2022-06-07 DIAGNOSIS — J3089 Other allergic rhinitis: Secondary | ICD-10-CM | POA: Diagnosis not present

## 2022-06-07 DIAGNOSIS — E739 Lactose intolerance, unspecified: Secondary | ICD-10-CM

## 2022-06-07 MED ORDER — AMOXICILLIN 500 MG PO TABS: 1000.0000 mg | ORAL_TABLET | Freq: Three times a day (TID) | ORAL | 0 refills | Status: AC

## 2022-06-07 NOTE — Progress Notes (Signed)
FOLLOW UP Date of Service/Encounter:  06/07/22   Subjective:  Tanya Peterson (DOB: [birth year]) is a 61 y.o. female who returns to the [facility] on 06/07/2022 in re-evaluation of the following: asthma, allergic rhinitis and conjunctivitis, multiple food intolerances. Here today for acute visit due to cough and congestion. History obtained from: chart review and patient.  For Review, LV was on 01/01/22  with Dr.Lailee Hoelzel seen for routine follow-up. Reported feeling throat closing after eating foods with tyramine and was able to resolve symptoms with a friend's reassurance and 3 ativan.  Fev1 54% at that visit but poor effort.   --------------------------------------------- Today presents for follow-up. Started coughing up phlegm with wheeze and rattle that started about one week ago.  She did have an at home COVID test that was negative.  She continues to cough, feels wiped out.  Cough sounds congested but no longer having production.  She is using her albuterol but doesn't feel like it is helping significant.  No drainage.  No sinus pressure.  No fevers, but the day it started her temperature increased to 98.72F which is unusual for her.  She took tylenol this day and has not had elevated temperature since.   Has not had any sick contacts that she is aware of. She does continue her Flovent 110, 2 puffs twice a day.  Typically uses albuterol once or twice a week but has been using more frequently since onset of the symptoms. Otherwise her allergies have been well controlled on current regimen.  -------------------------------------------------------------- Pertinent History/Diagnostics:  - Asthma: mild intermittent; Covid infection Dec 2022, avoiding singulair due to concerns regarding side effects                - 10/30/21 spirometry: ratio 115%, 1.60L, 64% predicted FEV1  --  AEC (08/30/2021): 200 -CXR on (02/28/17): impression-normal, reviewed by me personally, no acute pulmonary  abnormalities noted - Allergic Rhinitis and conjunctivitis:                 - SPT environmental panel (10/31/2019): + grasses, trees, fusarium mold, cats, dogs, mice                - avoiding nasal sprays due to history of nosebleeds - Food intolerance (gluten and spices)                - Hx of reaction: Extreme sleepiness                - SPT select foods (10/30/2021): Negative to wheat, barley, oat, rye, karaya gum, Acacia, cinnamon, nutmeg, ginger, garlic, pepper, mustard  Allergies as of 06/07/2022       Reactions   Other Other (See Comments)   Glutein - puts to sleep -teramines - Elevated heart rate Glutein - puts to sleep -teramines - Elevated heart rate Glutein - puts to sleep -teramines - Elevated heart rate Glutein - puts to sleep -teramines - Elevated heart rate   Tyramine Palpitations   Cause Tachycardia   Lactose Diarrhea, Other (See Comments)   No Known Allergies    Lactulose Other (See Comments)   Upset stomach Upset stomach Upset stomach   Neosporin [neomycin-bacitracin Zn-polymyx] Rash        Medication List        Accurate as of June 07, 2022 11:26 AM. If you have any questions, ask your nurse or doctor.          STOP taking these medications    Alvesco 80  MCG/ACT inhaler Generic drug: ciclesonide Stopped by: Clemon Chambers, MD   KRILL OIL PO Stopped by: Clemon Chambers, MD   magnesium oxide 400 MG tablet Commonly known as: MAG-OX Stopped by: Clemon Chambers, MD   montelukast 10 MG tablet Commonly known as: Singulair Stopped by: Clemon Chambers, MD       TAKE these medications    acetaminophen 325 MG tablet Commonly known as: TYLENOL Take 650 mg by mouth every 6 (six) hours as needed.   albuterol 108 (90 Base) MCG/ACT inhaler Commonly known as: VENTOLIN HFA Inhale 1 puff into the lungs every 30 (thirty) minutes as needed for wheezing or shortness of breath.   amoxicillin 500 MG tablet Commonly known as: AMOXIL Take 2 tablets (1,000  mg total) by mouth in the morning, at noon, and at bedtime for 7 days. Started by: Clemon Chambers, MD   amphetamine-dextroamphetamine 10 MG 24 hr capsule Commonly known as: ADDERALL XR dextroamphetamine-amphetamine ER 10 mg 24hr capsule,extend release   aspirin EC 81 MG tablet Take 1 tablet (81 mg total) by mouth daily.   Biotin 5 MG Caps Take by mouth.   CALCIUM PO Take by mouth as directed.   COLLAGEN PO Take by mouth.   Estradiol 10 MCG Tabs vaginal tablet Place 1 tablet vaginally 2 (two) times a week.   fluticasone 110 MCG/ACT inhaler Commonly known as: Flovent HFA Inhale 2 puffs into the lungs 2 (two) times daily.   gabapentin 100 MG capsule Commonly known as: NEURONTIN Take 100 mg by mouth 3 (three) times daily. Pt takes 2 tablets,200 mg in the morning, and 2 tablets,200 mg in the midday and 3 tablets,300 mg at night.   Garlic 9371 MG Caps Take by mouth.   GRAPE SEED PO Take by mouth. Grape seed with resveratrol   hydrocortisone 2.5 % ointment Apply topically.   ibuprofen 800 MG tablet Commonly known as: ADVIL Take by mouth.   LORazepam 0.5 MG tablet Commonly known as: ATIVAN Take 0.5 mg by mouth. As needed.   Magnesium 400 MG Caps Take by mouth.   Melatonin 5 MG Caps Take by mouth.   nitroGLYCERIN 0.2 mg/hr patch Commonly known as: Nitro-Dur Place 1 patch (0.2 mg total) onto the skin daily.   NON FORMULARY C-Medicare scar cream--Use as directed   triamcinolone ointment 0.5 % Commonly known as: KENALOG Apply topically.   Turmeric 500 MG Tabs Take 1,000 Units by mouth.   vitamin B-12 1000 MCG tablet Commonly known as: CYANOCOBALAMIN Take 1,000 mcg by mouth daily.   vitamin C 1000 MG tablet Take 1,000 mg by mouth daily.   Vitamin D3 25 MCG (1000 UT) Caps Take by mouth daily.   vitamin E 180 MG (400 UNITS) capsule Take 400 Units by mouth daily.   Zinc 50 MG Caps Take by mouth.       Past Medical History:  Diagnosis Date   ADHD  (attention deficit hyperactivity disorder)    Asthma    rarely uses inhaler   Bipolar affective disorder ([place])    Complication of anesthesia    Depression    Fibromuscular dysplasia of cervicocranial artery (HCC)    History of kidney stones    Panic attacks    PONV (postoperative nausea and vomiting)    Sleep disorder    tx with nuvigal   Past Surgical History:  Procedure Laterality Date   BREAST EXCISIONAL BIOPSY Right 10/02/2017   papilloma   BREAST LUMPECTOMY WITH RADIOACTIVE SEED  LOCALIZATION Right 10/02/2017   Procedure: RIGHT BREAST LUMPECTOMY WITH RADIOACTIVE SEED LOCALIZATION ERAS PATHWAY;  Surgeon: Jovita Kussmaul, MD;  Location: [facility];  Service: General;  Laterality: Right;   COLONOSCOPY WITH PROPOFOL N/A 01/11/2015   Procedure: COLONOSCOPY WITH PROPOFOL;  Surgeon: Garlan Fair, MD;  Location: [facility];  Service: Endoscopy;  Laterality: N/A;   HYSTEROSCOPY WITH D & C N/A 03/05/2016   Procedure: DILATATION AND CURETTAGE /HYSTEROSCOPY;  Surgeon: Vanessa Kick, MD;  Location: [facility];  Service: Gynecology;  Laterality: N/A;   kdiney stone surgery  12/2015   LIPOSUCTION     TENDON REPAIR Left 06/20/2016   foot   WISDOM TOOTH EXTRACTION     Otherwise, there have been no changes to her past medical history, surgical history, family history, or social history.  ROS: All others negative except as noted per HPI.   Objective:  BP 138/78   Pulse 86   Temp 98.6 F (37 C) (Temporal)   Resp 18   SpO2 96%  There is no height or weight on file to calculate BMI. Physical Exam: General Appearance:  Alert, cooperative, no distress, appears stated age  Head:  Normocephalic, without obvious abnormality, atraumatic  Eyes:  Conjunctiva clear, EOM's intact  Nose: Nares normal, hypertrophic turbinates, normal mucosa, no visible anterior polyps, and septum midline  Throat: Lips, geographic tongue; teeth and gums normal, normal posterior oropharynx  Neck: Supple,  symmetrical  Lungs:   Some crackles and wheezing in left lower lobe , Respirations unlabored,  intermittent congested sounding cough  Heart:  regular rate and rhythm and no murmur, Appears well perfused  Extremities: No edema  Skin: Skin color, texture, turgor normal, no rashes or lesions on visualized portions of skin  Neurologic: No gross deficits    Assessment/Plan   Concern for community-acquired pneumonia: afebrile, normal oxygenation, wheezing and crackles in left lower lobe - amoxicillin 1000 mg (2 tablets) three times daily x 7 days; take with live cultured yogurts or probiotics - start prednisone 20 mg (2 tablets) daily x 4 days, then 10 mg on day 5-pack provided in clinic - call if no improvement by Monday  Seasonal and perennial allergic rhinitis: controlled - allergen avoidance towards grasses, trees, fusarium mold, cats, dogs, mice - consider allergy shots as long term control of your symptoms by teaching your immune system to be more tolerant of your allergy triggers - avoid nasal sprays due to history of nose bleeds, consider ENT if continuing to have recurrent nose bleeds -Consider nasal saline rinses as needed, this will washout pollen and help moisten your mucus.  Will help with nosebleeds - Continue over the counter antihistamine daily or daily as needed.   -Your options include Zyrtec (Cetirizine) '10mg'$ , Claritin/Alleclear (Loratadine) '10mg'$ , Allegra (Fexofenadine) '180mg'$ , or Xyzal (Levocetirinze) '5mg'$   Allergic Conjunctivitis: - Continue Allergy Eye drops: great options include Pataday (Olopatadine) or Zaditor (ketotifen) for eye symptoms daily as needed-both sold over the counter if not covered by insurance.   -Avoid eye drops that say red eye relief as they may contain medications that dry out your eyes.  Mild Intermittent Asthma: Controlled - Controller med: Flovent 110, 2 puffs twice daily.   For respiratory illness/asthma flare: Increase Flovent to 3 puffs twice  daily. - Rescue Inhaler: Albuterol (Proair/Ventolin) 2 puffs . Use  every 4-6 hours as needed for chest tightness, wheezing, or coughing.  Can also use 15 minutes prior to exercise if you have symptoms with activity. - Asthma is  not controlled if:  - Symptoms are occurring >2 times a week OR  - >2 times a month nighttime awakenings  - You are requiring systemic steroids (prednisone/steroid injections) more than once per year  - Your require hospitalization for your asthma.  - Please call the clinic to schedule a follow up if these symptoms arise  Concerns regarding food allergy Your symptoms with gluten and spices are more consistent with an intolerance, avoidance is optional, but will help you avoid unwanted symptoms - allergy testing previously negative to wheat, oat, rye, barley, karaya gum, acacia, (arabic gum), cinnamon, nutmeg, ginger, garlic, black pepper, mustar - consider elmination diets to help determine which spice is causing you to feel sleepy - tyramines are a known food trigger for migraines, you should avoid or eat in small quantities to prevent symptoms  History of sensation of throat tightening after eating tyramines:  - suspect intolerance, but will prescribe epinephrine autoinjector to be used in event of airway symptoms (throat swelling, trouble breathing, coughing, wheezing) following food ingestion - please seek immediate medical attention if using epinephrine autoinjector - please ask for a tryptase level to be drawn during a reaction if recurs  Lactose Intolerance- This is not an allergy but rather a problem with breaking down dairy (due to an enzyme deficiency) which can lead to bloating, gas, diarrhea, nausea and vomiting - choose lactose free dairy or take lactaid prior to eating dairy products that contain lactose  Follow-up in 6 months, sooner if needed. It was a pleasure seeing you in clinic today.  I hope you feel better soon.  Sigurd Sos, MD  [facility]

## 2022-06-07 NOTE — Patient Instructions (Addendum)
Pneumonia:  - amoxicillin 1000 mg (2 tablets) three times daily x 7 days; take with live cultured yogurts or probiotics - start prednisone 20 mg (2 tablets) daily x 4 days, then 10 mg on day 5 - call if no improvement by Monday  Seasonal and perennial allergic rhinitis: controlled - allergen avoidance towards grasses, trees, fusarium mold, cats, dogs, mice - consider allergy shots as long term control of your symptoms by teaching your immune system to be more tolerant of your allergy triggers - avoid nasal sprays due to history of nose bleeds, consider ENT if continuing to have recurrent nose bleeds -Consider nasal saline rinses as needed, this will washout pollen and help moisten your mucus.  Will help with nosebleeds - Continue over the counter antihistamine daily or daily as needed.   -Your options include Zyrtec (Cetirizine) '10mg'$ , Claritin/Alleclear (Loratadine) '10mg'$ , Allegra (Fexofenadine) '180mg'$ , or Xyzal (Levocetirinze) '5mg'$   Allergic Conjunctivitis: - Continue Allergy Eye drops: great options include Pataday (Olopatadine) or Zaditor (ketotifen) for eye symptoms daily as needed-both sold over the counter if not covered by insurance.   -Avoid eye drops that say red eye relief as they may contain medications that dry out your eyes.  Mild Intermittent Asthma: Controlled - Controller med: Flovent 110, 2 puffs twice daily.   For respiratory illness/asthma flare: Increase Flovent to 3 puffs twice daily. - Rescue Inhaler: Albuterol (Proair/Ventolin) 2 puffs . Use  every 4-6 hours as needed for chest tightness, wheezing, or coughing.  Can also use 15 minutes prior to exercise if you have symptoms with activity. - Asthma is not controlled if:  - Symptoms are occurring >2 times a week OR  - >2 times a month nighttime awakenings  - You are requiring systemic steroids (prednisone/steroid injections) more than once per year  - Your require hospitalization for your asthma.  - Please call the clinic  to schedule a follow up if these symptoms arise  Concerns regarding food allergy Your symptoms with gluten and spices are more consistent with an intolerance, avoidance is optional, but will help you avoid unwanted symptoms - allergy testing previously negative to wheat, oat, rye, barley, karaya gum, acacia, (arabic gum), cinnamon, nutmeg, ginger, garlic, black pepper, mustar - consider elmination diets to help determine which spice is causing you to feel sleepy - tyramines are a known food trigger for migraines, you should avoid or eat in small quantities to prevent symptoms  History of sensation of throat tightening after eating tyramines:  - suspect intolerance, but will prescribe epinephrine autoinjector to be used in event of airway symptoms (throat swelling, trouble breathing, coughing, wheezing) following food ingestion - please seek immediate medical attention if using epinephrine autoinjector - please ask for a tryptase level to be drawn during a reaction if recurs  Lactose Intolerance- This is not an allergy but rather a problem with breaking down dairy (due to an enzyme deficiency) which can lead to bloating, gas, diarrhea, nausea and vomiting - choose lactose free dairy or take lactaid prior to eating dairy products that contain lactose  Follow-up in 6 months, sooner if needed. It was a pleasure seeing you in clinic today.  I hope you feel better soon.

## 2022-07-02 ENCOUNTER — Ambulatory Visit: Payer: 59 | Admitting: Internal Medicine

## 2022-07-04 NOTE — Progress Notes (Signed)
FOLLOW UP Date of Service/Encounter:  07/06/22   Subjective:  Tanya Peterson (DOB: [birth year]) is a 61 y.o. female who returns to the [facility] [place] on 07/06/2022 in re-evaluation of the following: asthma, allergic rhinitis, food intolerance History obtained from: chart review and patient.  For Review, LV was on 06/07/22  with Dr.Raeley Gilmore seen for acute visit for community-acquired pneumonia started on amoxicillin and prednisone .  Additionally she was seen by ENT for recurrent epistaxis of right nostril requiring cauterization on 06/19/2022.  Encouraged to use saline gels twice daily and nasal sprays as needed.  Today presents for follow-up. She feels much better than at last visit.  She occasionally has a deep cough and feels like she needs to cough something up.  This has been an ongoing issue since she had COVID in December. She is still coughing daily, but but both the frequency and intensity is "100% better "than at her last visit.  She continues to produce phlegm but is no longer green.  Now occasionally white or clear.  She is drinking hot mint water which helps her at night.  Also drinking ginger water at night. She has a humidifier beside her bed. She does not feel like she has drainage.  She continues to feel fatigued, and is having to use her rescue inhaler at least once a day, sometimes multiple times per day depending on how active she is. She did have recent nasal cauterization, and is using nasal saline rinses as well as AyR gel along with her daily antihistamine. ------------------------------- Pertinent History/Diagnostics:  - Asthma: mild intermittent; Covid infection Dec 2022, avoiding singulair due to concerns regarding side effects                - 10/30/21 spirometry: ratio 115%, 1.60L, 64% predicted FEV1  --  AEC (08/30/2021): 200 -CXR on (02/28/17): impression-normal, reviewed by me personally, no acute pulmonary abnormalities noted - Allergic Rhinitis and  conjunctivitis:                 - SPT environmental panel (10/31/2019): + grasses, trees, fusarium mold, cats, dogs, mice                - avoiding nasal sprays due to history of nosebleeds, required cauterization of right nostril on 06/19/2022 - Food intolerance (gluten and spices)                - Hx of reaction: Extreme sleepiness                - SPT select foods (10/30/2021): Negative to wheat, barley, oat, rye, karaya gum, Acacia, cinnamon, nutmeg, ginger, garlic, pepper, mustard  Allergies as of 07/06/2022       Reactions   Other Other (See Comments)   Glutein - puts to sleep -teramines - Elevated heart rate Glutein - puts to sleep -teramines - Elevated heart rate Glutein - puts to sleep -teramines - Elevated heart rate Glutein - puts to sleep -teramines - Elevated heart rate   Tyramine Palpitations   Cause Tachycardia   Lactose Diarrhea, Other (See Comments)   No Known Allergies    Lactulose Other (See Comments)   Upset stomach Upset stomach Upset stomach   Neosporin [neomycin-bacitracin Zn-polymyx] Rash        Medication List        Accurate as of July 06, 2022  1:22 PM. If you have any questions, ask your nurse or doctor.  STOP taking these medications    fluticasone 110 MCG/ACT inhaler Commonly known as: Flovent HFA Stopped by: Clemon Chambers, MD       TAKE these medications    acetaminophen 325 MG tablet Commonly known as: TYLENOL Take 650 mg by mouth every 6 (six) hours as needed.   albuterol 108 (90 Base) MCG/ACT inhaler Commonly known as: VENTOLIN HFA Inhale 1 puff into the lungs every 30 (thirty) minutes as needed for wheezing or shortness of breath.   amphetamine-dextroamphetamine 10 MG 24 hr capsule Commonly known as: ADDERALL XR dextroamphetamine-amphetamine ER 10 mg 24hr capsule,extend release   aspirin EC 81 MG tablet Take 1 tablet (81 mg total) by mouth daily.   Biotin 5 MG Caps Take by mouth.   CALCIUM PO Take by  mouth as directed.   COLLAGEN PO Take by mouth.   cyanocobalamin 1000 MCG tablet Commonly known as: VITAMIN B12 Take 1,000 mcg by mouth daily.   Estradiol 10 MCG Tabs vaginal tablet Place 1 tablet vaginally 2 (two) times a week.   gabapentin 100 MG capsule Commonly known as: NEURONTIN Take 100 mg by mouth 3 (three) times daily. Pt takes 2 tablets,200 mg in the morning, and 2 tablets,200 mg in the midday and 3 tablets,300 mg at night.   Garlic 8242 MG Caps Take by mouth.   GRAPE SEED PO Take by mouth. Grape seed with resveratrol   hydrocortisone 2.5 % ointment Apply topically.   ibuprofen 800 MG tablet Commonly known as: ADVIL Take by mouth.   LORazepam 0.5 MG tablet Commonly known as: ATIVAN Take 0.5 mg by mouth. As needed.   Magnesium 400 MG Caps Take by mouth.   Melatonin 5 MG Caps Take by mouth.   nitroGLYCERIN 0.2 mg/hr patch Commonly known as: Nitro-Dur Place 1 patch (0.2 mg total) onto the skin daily.   NON FORMULARY C-Medicare scar cream--Use as directed   Symbicort 160-4.5 MCG/ACT inhaler Generic drug: budesonide-formoterol Inhale 2 puffs into the lungs in the morning and at bedtime. Started by: Clemon Chambers, MD   triamcinolone ointment 0.5 % Commonly known as: KENALOG Apply topically.   Turmeric 500 MG Tabs Take 1,000 Units by mouth.   vitamin C 1000 MG tablet Take 1,000 mg by mouth daily.   Vitamin D3 25 MCG (1000 UT) Caps Take by mouth daily.   vitamin E 180 MG (400 UNITS) capsule Take 400 Units by mouth daily.   Zinc 50 MG Caps Take by mouth.       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[place])    History of kidney stones    Panic attacks    PONV (postoperative nausea and vomiting)    Sleep disorder    tx with  nuvigal   Past Surgical History:  Procedure Laterality Date   BREAST EXCISIONAL BIOPSY Right 10/02/2017   papilloma   BREAST LUMPECTOMY WITH RADIOACTIVE SEED LOCALIZATION Right 10/02/2017   Procedure: RIGHT BREAST LUMPECTOMY WITH RADIOACTIVE SEED LOCALIZATION ERAS PATHWAY;  Surgeon: Jovita Kussmaul, MD;  Location: [facility];  Service: General;  Laterality: Right;   COLONOSCOPY WITH PROPOFOL N/A 01/11/2015   Procedure: COLONOSCOPY WITH PROPOFOL;  Surgeon: Garlan Fair, MD;  Location: [facility];  Service: Endoscopy;  Laterality: N/A;  HYSTEROSCOPY WITH D & C N/A 03/05/2016   Procedure: DILATATION AND CURETTAGE /HYSTEROSCOPY;  Surgeon: Vanessa Kick, MD;  Location: [facility];  Service: Gynecology;  Laterality: N/A;   kdiney stone surgery  12/2015   LIPOSUCTION     TENDON REPAIR Left 06/20/2016   foot   WISDOM TOOTH EXTRACTION     Otherwise, there have been no changes to her past medical history, surgical history, family history, or social history.  ROS: All others negative except as noted per HPI.   Objective:  BP 102/68 (BP Location: Left Arm, Patient Position: Sitting, Cuff Size: Normal)   Pulse 92   Temp 98.2 F (36.8 C) (Temporal)   Wt 173 lb 9.6 oz (78.7 kg)   BMI 29.80 kg/m  Body mass index is 29.8 kg/m. Physical Exam: General Appearance:  Alert, cooperative, no distress, appears stated age  Head:  Normocephalic, without obvious abnormality, atraumatic  Eyes:  Conjunctiva clear, EOM's intact  Nose: Nares normal, hypertrophic turbinates, normal mucosa, and no visible anterior polyps  Throat: Lips, tongue normal; teeth and gums normal, normal posterior oropharynx and + cobblestoning  Neck: Supple, symmetrical  Lungs:   clear to auscultation bilaterally, Respirations unlabored, intermittent dry coughing  Heart:  regular rate and rhythm and no murmur, Appears well perfused  Extremities: No edema  Skin: Skin color, texture, turgor normal, no rashes or lesions on  visualized portions of skin  Neurologic: No gross deficits   Spirometry:  Tracings reviewed. Her effort: Good reproducible efforts. FVC: 2.13L FEV1: 2.07L, 83% predicted FEV1/FVC ratio: 110% Interpretation: Spirometry consistent with possible restrictive disease. Improved from previous visit spirometry Please see scanned spirometry results for details.  Assessment/Plan  Mrs. Mcconnell is much improved from her last visit though continues to have daily persistent cough and shortness of breath.  Her lungs were clear on exam today.  Her spirometry is much improved from last visit as well.  We discussed a steroid injection as well as Mucinex to help dry up remaining mucus production.  We will increase her from Flovent 110 to Symbicort 160 for the next 3 months.  She will let us know if she is not feeling significantly improved in the next couple weeks.   Seasonal and perennial allergic rhinitis: uncontrolled - allergen avoidance towards grasses, trees, fusarium mold, cats, dogs, mice - consider allergy shots as long term control of your symptoms by teaching your immune system to be more tolerant of your allergy triggers - avoid nasal sprays due to history of nose bleeds, consider ENT if continuing to have recurrent nose bleeds -Consider nasal saline rinses and ayr gel as needed, this will washout pollen and help moisten your mucus.  Will help with nosebleeds - Continue over the counter antihistamine daily or daily as needed.   -Your options include Zyrtec (Cetirizine) '10mg'$ , Claritin/Alleclear (Loratadine) '10mg'$ , Allegra (Fexofenadine) '180mg'$ , or Xyzal (Levocetirinze) '5mg'$  - Start mucinex [phone] mg twice a day for the next week or two  Allergic Conjunctivitis: Stable - Continue Allergy Eye drops: great options include Pataday (Olopatadine) or Zaditor (ketotifen) for eye symptoms daily as needed-both sold over the counter if not covered by insurance.   -Avoid eye drops that say red eye relief as they  may contain medications that dry out your eyes.  Mild Intermittent Asthma: uncontrolled Your breathing test looks much better today - Controller med: Symbicort 160 mcg 2 puffs twice daily with a spacer. Rinse mouth after use. IM 80 mg depomedrol  For respiratory illness/asthma flare: Increase Symbicort  to 3 puffs twice daily. - Rescue Inhaler: Albuterol (Proair/Ventolin) 2 puffs . Use  every 4-6 hours as needed for chest tightness, wheezing, or coughing.  Can also use 15 minutes prior to exercise if you have symptoms with activity. - Asthma is not controlled if:  - Symptoms are occurring >2 times a week OR  - >2 times a month nighttime awakenings  - You are requiring systemic steroids (prednisone/steroid injections) more than once per year  - Your require hospitalization for your asthma.  - Please call the clinic to schedule a follow up if these symptoms arise  Concerns regarding food allergy Your symptoms with gluten and spices are more consistent with an intolerance, avoidance is optional, but will help you avoid unwanted symptoms - allergy testing previously negative to wheat, oat, rye, barley, karaya gum, acacia, (arabic gum), cinnamon, nutmeg, ginger, garlic, black pepper, mustar - consider elmination diets to help determine which spice is causing you to feel sleepy - tyramines are a known food trigger for migraines, you should avoid or eat in small quantities to prevent symptoms  History of sensation of throat tightening after eating tyramines:  - suspect intolerance, but will prescribe epinephrine autoinjector to be used in event of airway symptoms (throat swelling, trouble breathing, coughing, wheezing) following food ingestion - please seek immediate medical attention if using epinephrine autoinjector - please ask for a tryptase level to be drawn during a reaction if recurs  Lactose Intolerance- This is not an allergy but rather a problem with breaking down dairy (due to an  enzyme deficiency) which can lead to bloating, gas, diarrhea, nausea and vomiting - choose lactose free dairy or take lactaid prior to eating dairy products that contain lactose  Follow-up in 3 months, sooner if needed. It was a pleasure seeing you in clinic today.  I hope you feel better soon.  Sigurd Sos, MD  [facility]

## 2022-07-06 ENCOUNTER — Encounter: Payer: Self-pay | Admitting: Internal Medicine

## 2022-07-06 ENCOUNTER — Ambulatory Visit: Payer: Medicare HMO | Admitting: Internal Medicine

## 2022-07-06 VITALS — BP 102/68 | HR 92 | Temp 98.2°F | Wt 173.6 lb

## 2022-07-06 DIAGNOSIS — H1013 Acute atopic conjunctivitis, bilateral: Secondary | ICD-10-CM

## 2022-07-06 DIAGNOSIS — J3089 Other allergic rhinitis: Secondary | ICD-10-CM | POA: Diagnosis not present

## 2022-07-06 DIAGNOSIS — J4520 Mild intermittent asthma, uncomplicated: Secondary | ICD-10-CM | POA: Diagnosis not present

## 2022-07-06 DIAGNOSIS — J4521 Mild intermittent asthma with (acute) exacerbation: Secondary | ICD-10-CM

## 2022-07-06 DIAGNOSIS — E739 Lactose intolerance, unspecified: Secondary | ICD-10-CM | POA: Diagnosis not present

## 2022-07-06 DIAGNOSIS — T781XXA Other adverse food reactions, not elsewhere classified, initial encounter: Secondary | ICD-10-CM

## 2022-07-06 DIAGNOSIS — J302 Other seasonal allergic rhinitis: Secondary | ICD-10-CM

## 2022-07-06 MED ORDER — METHYLPREDNISOLONE ACETATE 80 MG/ML IJ SUSP
80.0000 mg | Freq: Once | INTRAMUSCULAR | Status: AC
  Administered 2022-07-06 – 2022-07-09 (×2): 80 mg via INTRAMUSCULAR

## 2022-07-06 MED ORDER — SYMBICORT 160-4.5 MCG/ACT IN AERO: 2.0000 | INHALATION_SPRAY | Freq: Two times a day (BID) | RESPIRATORY_TRACT | 3 refills | Status: DC

## 2022-07-06 NOTE — Patient Instructions (Addendum)
Seasonal and perennial allergic rhinitis: uncontrolled - allergen avoidance towards grasses, trees, fusarium mold, cats, dogs, mice - consider allergy shots as long term control of your symptoms by teaching your immune system to be more tolerant of your allergy triggers - avoid nasal sprays due to history of nose bleeds, consider ENT if continuing to have recurrent nose bleeds -Consider nasal saline rinses and ayr gel as needed, this will washout pollen and help moisten your mucus.  Will help with nosebleeds - Continue over the counter antihistamine daily or daily as needed.   -Your options include Zyrtec (Cetirizine) '10mg'$ , Claritin/Alleclear (Loratadine) '10mg'$ , Allegra (Fexofenadine) '180mg'$ , or Xyzal (Levocetirinze) '5mg'$  - Start mucinex [phone] mg twice a day for the next week or two  Allergic Conjunctivitis: - Continue Allergy Eye drops: great options include Pataday (Olopatadine) or Zaditor (ketotifen) for eye symptoms daily as needed-both sold over the counter if not covered by insurance.   -Avoid eye drops that say red eye relief as they may contain medications that dry out your eyes.  Mild Intermittent Asthma: uncontrolled Your breathing test looks much better today - Controller med: Symbicort 160 mcg 2 puffs twice daily with a spacer. Rinse mouth after use. IM 80 mg depomedrol  For respiratory illness/asthma flare: Increase Symbicortto 3 puffs twice daily. - Rescue Inhaler: Albuterol (Proair/Ventolin) 2 puffs . Use  every 4-6 hours as needed for chest tightness, wheezing, or coughing.  Can also use 15 minutes prior to exercise if you have symptoms with activity. - Asthma is not controlled if:  - Symptoms are occurring >2 times a week OR  - >2 times a month nighttime awakenings  - You are requiring systemic steroids (prednisone/steroid injections) more than once per year  - Your require hospitalization for your asthma.  - Please call the clinic to schedule a follow up if these symptoms  arise  Concerns regarding food allergy Your symptoms with gluten and spices are more consistent with an intolerance, avoidance is optional, but will help you avoid unwanted symptoms - allergy testing previously negative to wheat, oat, rye, barley, karaya gum, acacia, (arabic gum), cinnamon, nutmeg, ginger, garlic, black pepper, mustar - consider elmination diets to help determine which spice is causing you to feel sleepy - tyramines are a known food trigger for migraines, you should avoid or eat in small quantities to prevent symptoms  History of sensation of throat tightening after eating tyramines:  - suspect intolerance, but will prescribe epinephrine autoinjector to be used in event of airway symptoms (throat swelling, trouble breathing, coughing, wheezing) following food ingestion - please seek immediate medical attention if using epinephrine autoinjector - please ask for a tryptase level to be drawn during a reaction if recurs  Lactose Intolerance- This is not an allergy but rather a problem with breaking down dairy (due to an enzyme deficiency) which can lead to bloating, gas, diarrhea, nausea and vomiting - choose lactose free dairy or take lactaid prior to eating dairy products that contain lactose  Follow-up in 3 months, sooner if needed. It was a pleasure seeing you in clinic today.  I hope you feel better soon.

## 2022-07-10 ENCOUNTER — Ambulatory Visit: Payer: Medicare HMO | Admitting: Podiatry

## 2022-07-10 DIAGNOSIS — G8929 Other chronic pain: Secondary | ICD-10-CM

## 2022-07-10 DIAGNOSIS — M7751 Other enthesopathy of right foot: Secondary | ICD-10-CM

## 2022-07-17 NOTE — Progress Notes (Signed)
Subjective: 61 year old female presents the office today for follow-up evaluation of ankle pain on both sides.  States that she is currently still in treatment for the shoulder/neck issue and we have injections to be doing physical therapy which has been helpful.  Feels that physical therapy is doing better at this time than it was previously for her neck/shoulder.  Her feet are about the same.   Objective: AAO x3, NAD DP/PT pulses palpable bilaterally, CRT less than 3 seconds There is mild tenderness palpation on sinus tarsi on the right side.  Mild discomfort to the area in the left side.  There is mild discomfort on the peroneal tendon posterior to the lateral malleolus on the right side.  No gross ankle instability present.  No peroneal tendon appears to be sitting in anatomical position bilaterally.  There is no some edema there is no erythema or warmth.  There is no erythema.  Well exam unchanged No pain with calf compression, swelling, warmth, erythema  Assessment: 61 year old female with capsulitis, tendinitis  Plan: -All treatment options discussed with the patient including all alternatives, risks, complications.  -We again discussed conservative versus surgical treatment.  We discussed surgical intervention for this we discussed possible MRIs however at this time we will hold off on this and continue with conservative care.  For now we are going to focus on injections, therapy for the shoulder, neck.  Once that improves then will then focus on the lower extremity.  Discussed different types of injections to help with her pain as well as physical therapy again.  For now continue with home rehab exercises, supportive shoe gear, bracing if needed.  Trula Slade DPM

## 2022-10-09 ENCOUNTER — Ambulatory Visit (INDEPENDENT_AMBULATORY_CARE_PROVIDER_SITE_OTHER): Payer: Medicare HMO

## 2022-10-09 ENCOUNTER — Ambulatory Visit: Payer: Medicare HMO | Admitting: Podiatry

## 2022-10-09 DIAGNOSIS — M7751 Other enthesopathy of right foot: Secondary | ICD-10-CM | POA: Diagnosis not present

## 2022-10-09 DIAGNOSIS — M19071 Primary osteoarthritis, right ankle and foot: Secondary | ICD-10-CM

## 2022-10-09 MED ORDER — TRIAMCINOLONE ACETONIDE 10 MG/ML IJ SUSP
10.0000 mg | Freq: Once | INTRAMUSCULAR | Status: AC
  Administered 2022-10-09: 10 mg

## 2022-10-09 NOTE — Patient Instructions (Signed)

## 2022-10-10 NOTE — Progress Notes (Signed)
FOLLOW UP Date of Service/Encounter:  10/15/22   Subjective:  Tanya Peterson (DOB: [birth year]) is a 61 y.o. female who returns to the [facility] [place] on 10/15/2022 in re-evaluation of the following: asthma, allergic rhinitis, food intolerance  History obtained from: chart review and patient.  For Review, LV was on 07/06/22  with Dr.Harol Shabazz seen for routine follow-up.  She was still having a cough most days but felt much improved compared to prior visit.  ----------------------------- Pertinent History/Diagnostics:  Asthma:  mild intermittent; Covid infection Dec 2022, avoiding singulair due to concerns regarding side effects - 10/30/21 spirometry: ratio 115%, 1.60L, 64% predicted FEV1  --  AEC (08/30/2021): 200 -CXR on (07/25/22): impression-normal Allergic Rhinitis and conjunctivitis:  avoiding nasal sprays due to history of nosebleeds, required cauterization of right nostril on 06/19/2022  - SPT environmental panel (10/31/2019): + grasses, trees, fusarium mold, cats, dogs, mice Food intolerance (gluten and spices) Hx of reaction: Extreme sleepiness  -- SPT select foods (10/30/2021): Negative to wheat, barley, oat, rye, karaya gum, Acacia, cinnamon, nutmeg, ginger, garlic, pepper, mustard ------------------------------- Today presents for follow-up. She continues to have daily cough which is better, but still occurring daily.  She was seen by ENT and was referred to pulmonary with plan for imaging.  ACT 17/23. She has been able to decrease the amount of albuterol use to 2-3 times per week.  The last visit she was using it multiple times per day.  She does feel improvement since starting Symbicort 160 and would like to continue this at least through the winter. Otherwise she is doing well. She was recently diagnosed with reflux and is taking medications for this which do improve some of her voice hoarseness.   Allergies as of 10/15/2022       Reactions   Other Other (See  Comments)   Glutein - puts to sleep -teramines - Elevated heart rate Glutein - puts to sleep -teramines - Elevated heart rate Glutein - puts to sleep -teramines - Elevated heart rate Glutein - puts to sleep -teramines - Elevated heart rate   Tyramine Palpitations   Cause Tachycardia   Lactose Diarrhea, Other (See Comments)   No Known Allergies    Lactulose Other (See Comments)   Upset stomach Upset stomach Upset stomach   Neosporin [neomycin-bacitracin Zn-polymyx] Rash        Medication List        Accurate as of October 15, 2022  4:22 PM. If you have any questions, ask your nurse or doctor.          acetaminophen 325 MG tablet Commonly known as: TYLENOL Take 650 mg by mouth every 6 (six) hours as needed.   albuterol 108 (90 Base) MCG/ACT inhaler Commonly known as: VENTOLIN HFA Inhale 1 puff into the lungs every 30 (thirty) minutes as needed for wheezing or shortness of breath.   amphetamine-dextroamphetamine 10 MG 24 hr capsule Commonly known as: ADDERALL XR dextroamphetamine-amphetamine ER 10 mg 24hr capsule,extend release   aspirin EC 81 MG tablet Take 1 tablet (81 mg total) by mouth daily.   Biotin 5 MG Caps Take by mouth.   CALCIUM PO Take by mouth as directed.   COLLAGEN PO Take by mouth.   cyanocobalamin 1000 MCG tablet Commonly known as: VITAMIN B12 Take 1,000 mcg by mouth daily.   EPINEPHrine 0.3 mg/0.3 mL Soaj injection Commonly known as: EPI-PEN epinephrine 0.3 mg/0.3 mL injection, auto-injector   esomeprazole 20 MG capsule Commonly known as: NEXIUM Take 20 mg  by mouth daily at 12 noon.   Estradiol 10 MCG Tabs vaginal tablet Place 1 tablet vaginally 2 (two) times a week.   gabapentin 100 MG capsule Commonly known as: NEURONTIN Take 100 mg by mouth 3 (three) times daily. Pt takes 2 tablets,200 mg in the morning, and 2 tablets,200 mg in the midday and 3 tablets,300 mg at night.   Garlic 7893 MG Caps Take by mouth.   GRAPE SEED  PO Take by mouth. Grape seed with resveratrol   hydrocortisone 2.5 % ointment Apply topically.   ibuprofen 800 MG tablet Commonly known as: ADVIL Take by mouth.   LORazepam 0.5 MG tablet Commonly known as: ATIVAN Take 0.5 mg by mouth. As needed.   Magnesium 400 MG Caps Take by mouth.   Melatonin 5 MG Caps Take by mouth.   nitroGLYCERIN 0.2 mg/hr patch Commonly known as: Nitro-Dur Place 1 patch (0.2 mg total) onto the skin daily.   NON FORMULARY C-Medicare scar cream--Use as directed   Symbicort 160-4.5 MCG/ACT inhaler Generic drug: budesonide-formoterol Inhale 2 puffs into the lungs in the morning and at bedtime.   triamcinolone ointment 0.5 % Commonly known as: KENALOG Apply topically.   Turmeric 500 MG Tabs Take 1,000 Units by mouth.   vitamin C 1000 MG tablet Take 1,000 mg by mouth daily.   Vitamin D3 25 MCG (1000 UT) Caps Take by mouth daily.   vitamin E 180 MG (400 UNITS) capsule Take 400 Units by mouth daily.   Zinc 50 MG Caps Take by mouth.       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[place])    History of kidney stones    Panic attacks    PONV (postoperative nausea and vomiting)    Sleep disorder    tx with nuvigal   Past Surgical History:  Procedure Laterality Date   BREAST EXCISIONAL BIOPSY Right 10/02/2017   papilloma   BREAST LUMPECTOMY WITH RADIOACTIVE SEED LOCALIZATION Right 10/02/2017   Procedure: RIGHT BREAST LUMPECTOMY WITH RADIOACTIVE SEED LOCALIZATION ERAS PATHWAY;  Surgeon: Jovita Kussmaul, MD;  Location: [facility];  Service: General;  Laterality: Right;   COLONOSCOPY WITH PROPOFOL N/A 01/11/2015   Procedure: COLONOSCOPY WITH PROPOFOL;  Surgeon: Garlan Fair, MD;  Location: [facility];  Service:  Endoscopy;  Laterality: N/A;   HYSTEROSCOPY WITH D & C N/A 03/05/2016   Procedure: DILATATION AND CURETTAGE /HYSTEROSCOPY;  Surgeon: Vanessa Kick, MD;  Location: [facility];  Service: Gynecology;  Laterality: N/A;   kdiney stone surgery  12/2015   LIPOSUCTION     TENDON REPAIR Left 06/20/2016   foot   WISDOM TOOTH EXTRACTION     Otherwise, there have been no changes to her past medical history, surgical history, family history, or social history.  ROS: All others negative except as noted per HPI.   Objective:  BP 120/60 (BP Location: Left Arm, Patient Position: Sitting, Cuff Size: Normal)   Pulse 88   Temp 98.1 F (36.7 C) (Temporal)   Resp 16   SpO2 98%  There is no height or weight on file to calculate BMI. Physical Exam: General Appearance:  Alert, cooperative, no distress, appears stated age  Head:  Normocephalic, without obvious abnormality, atraumatic  Eyes:  Conjunctiva clear,  EOM's intact  Nose: Nares normal, hypertrophic turbinates, normal mucosa, and no visible anterior polyps  Throat: Lips, tongue normal; teeth and gums normal, normal posterior oropharynx  Neck: Supple, symmetrical  Lungs:   clear to auscultation bilaterally, Respirations unlabored, intermittent dry coughing  Heart:  regular rate and rhythm and no murmur, Appears well perfused  Extremities: No edema  Skin: Skin color, texture, turgor normal, no rashes or lesions on visualized portions of skin  Neurologic: No gross deficits    Spirometry:  Tracings reviewed. Her effort: Good reproducible efforts. FVC: 2.75L FEV1: 2.29L, 92% predicted FEV1/FVC ratio: 0.83 Interpretation: Spirometry consistent with normal pattern.  Please see scanned spirometry results for details.  Assessment/Plan  We will check-in following appointment with pulmonary and hopefully pulmonary imaging.  May consider asthma biologic at follow-up. She will continue reflux medications and no changes to current plan.   Seasonal and perennial  allergic rhinitis - allergen avoidance towards grasses, trees, fusarium mold, cats, dogs, mice - consider allergy shots as long term control  - avoid nasal sprays due to history of nose bleeds -Consider nasal saline rinses and ayr gel as needed - Continue over the counter antihistamine daily or daily as needed.   -Your options include Zyrtec (Cetirizine) '10mg'$ , Claritin/Alleclear (Loratadine) '10mg'$ , Allegra (Fexofenadine) '180mg'$ , or Xyzal (Levocetirinze) '5mg'$   Allergic Conjunctivitis: - Continue Allergy Eye drops: great options include Pataday (Olopatadine) or Zaditor (ketotifen) for eye symptoms daily as needed  Mild Persistent Asthma: controlled - Controller med: Symbicort 160 mcg 2 puffs twice daily with a spacer. Rinse mouth after use.  For respiratory illness/asthma flare: Increase Symbicort to 3 puffs twice daily. - Rescue Inhaler: Albuterol (Proair/Ventolin) 2 puffs . Use  every 4-6 hours as needed for chest tightness, wheezing, or coughing.  Can also use 15 minutes prior to exercise if you have symptoms with activity. - Asthma is not controlled if:  - Symptoms are occurring >2 times a week OR  - >2 times a month nighttime awakenings  - You are requiring systemic steroids (prednisone/steroid injections) more than once per year  - Your require hospitalization for your asthma.  - Please call the clinic to schedule a follow up if these symptoms arise  Concerns regarding food allergy Your symptoms with gluten and spices are more consistent with an intolerance, avoidance is optional, but will help you avoid unwanted symptoms  History of sensation of throat tightening after eating tyramines:  - suspect intolerance, but will prescribe epinephrine autoinjector to be used in event of airway symptoms (throat swelling, trouble breathing, coughing, wheezing) following food ingestion - please seek immediate medical attention if using epinephrine autoinjector - please ask for a tryptase level to be  drawn during a reaction if recurs  Lactose Intolerance- - choose lactose free dairy or take lactaid prior to eating dairy products that contain lactose  Reflux Continue esomeprazole as prescribed Lifestyle and diet modifications  Follow-up in 4 months, sooner if needed.  Sigurd Sos, MD  [facility]

## 2022-10-15 ENCOUNTER — Encounter: Payer: Self-pay | Admitting: Internal Medicine

## 2022-10-15 ENCOUNTER — Ambulatory Visit: Payer: Medicare HMO | Admitting: Internal Medicine

## 2022-10-15 VITALS — BP 120/60 | HR 88 | Temp 98.1°F | Resp 16

## 2022-10-15 DIAGNOSIS — E739 Lactose intolerance, unspecified: Secondary | ICD-10-CM | POA: Diagnosis not present

## 2022-10-15 DIAGNOSIS — J302 Other seasonal allergic rhinitis: Secondary | ICD-10-CM

## 2022-10-15 DIAGNOSIS — T781XXA Other adverse food reactions, not elsewhere classified, initial encounter: Secondary | ICD-10-CM

## 2022-10-15 DIAGNOSIS — J4520 Mild intermittent asthma, uncomplicated: Secondary | ICD-10-CM

## 2022-10-15 DIAGNOSIS — K219 Gastro-esophageal reflux disease without esophagitis: Secondary | ICD-10-CM

## 2022-10-15 DIAGNOSIS — J3089 Other allergic rhinitis: Secondary | ICD-10-CM

## 2022-10-15 DIAGNOSIS — H1013 Acute atopic conjunctivitis, bilateral: Secondary | ICD-10-CM

## 2022-10-15 DIAGNOSIS — T781XXD Other adverse food reactions, not elsewhere classified, subsequent encounter: Secondary | ICD-10-CM

## 2022-10-15 MED ORDER — SYMBICORT 160-4.5 MCG/ACT IN AERO: 2.0000 | INHALATION_SPRAY | Freq: Two times a day (BID) | RESPIRATORY_TRACT | 5 refills | Status: DC

## 2022-10-15 NOTE — Progress Notes (Signed)
Subjective: 61 year old female presents the office today for follow-up evaluation of ankle pain on both sides.  She reports mild sinus tarsi but she is discomfort bilaterally.  She states over the weekend she did have quite a bit of pain to both sides.  No injuries or changes otherwise that she reports.   Of note she is describing hip pain on the right side and is describing nerve like symptoms.  Objective: AAO x3, NAD DP/PT pulses palpable bilaterally, CRT less than 3 seconds There is mild tenderness palpation on sinus tarsi on the right side >> left.  Not able to appreciate significant tenderness in the course of the peroneal tendons today.  No gross ankle instability present.  Mild discomfort in the ankle joint itself.  No peroneal tendon appears to be sitting in anatomical position bilaterally.  There is no some edema there is no erythema or warmth.  There is no erythema.  Well exam unchanged No pain with calf compression, swelling, warmth, erythema  Assessment: 61 year old female with capsulitis, tendinitis  Plan: -All treatment options discussed with the patient including all alternatives, risks, complications.  -Repeat x-rays were obtained and reviewed.  This may be some arthritic changes present of the ankle compared to prior x-rays.  I also reviewed the prior MRI with her which did show some marrow edema in the talus.  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 -Steroid injection performed the sinus tarsi today. -Continue issues, good arch support as well as home rehab exercises. -MRI today however I do not notice any change the treatment plan at this time.  Trula Slade DPM

## 2022-10-15 NOTE — Patient Instructions (Addendum)
Seasonal and perennial allergic rhinitis - allergen avoidance towards grasses, trees, fusarium mold, cats, dogs, mice - consider allergy shots as long term control  - avoid nasal sprays due to history of nose bleeds -Consider nasal saline rinses and ayr gel as needed - Continue over the counter antihistamine daily or daily as needed.   -Your options include Zyrtec (Cetirizine) '10mg'$ , Claritin/Alleclear (Loratadine) '10mg'$ , Allegra (Fexofenadine) '180mg'$ , or Xyzal (Levocetirinze) '5mg'$   Allergic Conjunctivitis: - Continue Allergy Eye drops: great options include Pataday (Olopatadine) or Zaditor (ketotifen) for eye symptoms daily as needed  Mild Persistent Asthma: controlled - Controller med: Symbicort 160 mcg 2 puffs twice daily with a spacer. Rinse mouth after use.  For respiratory illness/asthma flare: Increase Symbicort to 3 puffs twice daily. - Rescue Inhaler: Albuterol (Proair/Ventolin) 2 puffs . Use  every 4-6 hours as needed for chest tightness, wheezing, or coughing.  Can also use 15 minutes prior to exercise if you have symptoms with activity. - Asthma is not controlled if:  - Symptoms are occurring >2 times a week OR  - >2 times a month nighttime awakenings  - You are requiring systemic steroids (prednisone/steroid injections) more than once per year  - Your require hospitalization for your asthma.  - Please call the clinic to schedule a follow up if these symptoms arise  Concerns regarding food allergy Your symptoms with gluten and spices are more consistent with an intolerance, avoidance is optional, but will help you avoid unwanted symptoms  History of sensation of throat tightening after eating tyramines:  - suspect intolerance, but will prescribe epinephrine autoinjector to be used in event of airway symptoms (throat swelling, trouble breathing, coughing, wheezing) following food ingestion - please seek immediate medical attention if using epinephrine autoinjector - please ask for  a tryptase level to be drawn during a reaction if recurs  Lactose Intolerance- - choose lactose free dairy or take lactaid prior to eating dairy products that contain lactose  Reflux Continue esomeprazole as prescribed Lifestyle and diet modifications  Follow-up in 4 months, sooner if needed. It was a pleasure seeing you in clinic today.  I hope you feel better soon.

## 2022-10-16 ENCOUNTER — Other Ambulatory Visit: Payer: Self-pay | Admitting: Podiatry

## 2022-10-16 DIAGNOSIS — M7751 Other enthesopathy of right foot: Secondary | ICD-10-CM

## 2022-10-16 DIAGNOSIS — M19071 Primary osteoarthritis, right ankle and foot: Secondary | ICD-10-CM

## 2022-10-19 ENCOUNTER — Encounter: Payer: Self-pay | Admitting: Podiatry

## 2022-10-19 ENCOUNTER — Telehealth: Payer: Self-pay | Admitting: Podiatry

## 2022-10-19 NOTE — Telephone Encounter (Signed)
Dr. Krista Blue is calling to speak with Dr. Jacqualyn Posey regarding pts long term disability orders.   Direct number: [phone]  Please advise

## 2022-10-23 NOTE — Telephone Encounter (Signed)
Called him back, no answer. Left VM with my direct cell phone number

## 2022-10-25 ENCOUNTER — Other Ambulatory Visit: Payer: Self-pay | Admitting: Podiatry

## 2022-10-25 DIAGNOSIS — M779 Enthesopathy, unspecified: Secondary | ICD-10-CM

## 2022-10-26 ENCOUNTER — Ambulatory Visit: Payer: 59 | Admitting: Sports Medicine

## 2022-10-29 ENCOUNTER — Ambulatory Visit (INDEPENDENT_AMBULATORY_CARE_PROVIDER_SITE_OTHER): Payer: Medicare HMO | Admitting: Sports Medicine

## 2022-10-29 ENCOUNTER — Ambulatory Visit (INDEPENDENT_AMBULATORY_CARE_PROVIDER_SITE_OTHER): Payer: Medicare HMO

## 2022-10-29 DIAGNOSIS — M7061 Trochanteric bursitis, right hip: Secondary | ICD-10-CM

## 2022-10-29 DIAGNOSIS — M25551 Pain in right hip: Secondary | ICD-10-CM | POA: Insufficient documentation

## 2022-10-29 MED ORDER — MELOXICAM 15 MG PO TABS: ORAL_TABLET | ORAL | 3 refills | Status: DC

## 2022-10-29 NOTE — Patient Instructions (Signed)
Hip Rehabilitation Protocol:  1.  Side leg raises.  3x30 with no weight, then 3x15 with 2 lb ankle weight, then 3x15 with 5 lb ankle weight 2.  Standing hip rotation.  3x30 with no weight, then 3x15 with 2 lb ankle weight, then 3x15 with 5 lb ankle weight.

## 2022-10-29 NOTE — Assessment & Plan Note (Signed)
Very pleasant 61 year old female, long history of pain right hip laterally, on exam tenderness over the greater trochanter with significant pain to abduction, adding x-rays, meloxicam, home physical therapy, return to see me in 6 weeks, injection if not better.

## 2022-10-29 NOTE — Progress Notes (Signed)
    Procedures performed today:    None.  Independent interpretation of notes and tests performed by another provider:   None.  Brief History, Exam, Impression, and Recommendations:    Greater trochanteric bursitis, right Very pleasant 61 year old female, long history of pain right hip laterally, on exam tenderness over the greater trochanter with significant pain to abduction, adding x-rays, meloxicam, home physical therapy, return to see me in 6 weeks, injection if not better.  Chronic process with exacerbation and pharmacologic intervention  ____________________________________________ Gwen Her. Dianah Field, M.D., ABFM., CAQSM., AME. Primary Care and Sports Medicine [facility]  Adjunct Professor of [place] of [facility] of Medicine  Risk manager

## 2022-11-22 ENCOUNTER — Ambulatory Visit: Payer: Medicare HMO | Admitting: Podiatry

## 2022-11-22 ENCOUNTER — Ambulatory Visit (INDEPENDENT_AMBULATORY_CARE_PROVIDER_SITE_OTHER): Payer: Medicare HMO

## 2022-11-22 VITALS — BP 115/55 | HR 84

## 2022-11-22 DIAGNOSIS — M779 Enthesopathy, unspecified: Secondary | ICD-10-CM

## 2022-11-22 DIAGNOSIS — M25572 Pain in left ankle and joints of left foot: Secondary | ICD-10-CM

## 2022-11-22 DIAGNOSIS — M19071 Primary osteoarthritis, right ankle and foot: Secondary | ICD-10-CM | POA: Diagnosis not present

## 2022-11-22 DIAGNOSIS — M25571 Pain in right ankle and joints of right foot: Secondary | ICD-10-CM

## 2022-11-22 DIAGNOSIS — G8929 Other chronic pain: Secondary | ICD-10-CM

## 2022-11-22 DIAGNOSIS — M7751 Other enthesopathy of right foot: Secondary | ICD-10-CM

## 2022-11-22 NOTE — Progress Notes (Signed)
Subjective: Chief Complaint  Patient presents with   Foot Pain    Patient came in today for right foot and ankle pain, patient states she get a pop on the top of the ankle she rates that pain a 7 or 8 out of 10, X-rays done today    62 year old female presents the office today with the above concerns.  She states that she has not heard from the physical therapy but due to the dry needling she still gets pain to her feet after she does prolonged standing or walking she gets a clicking sensation to the anterior aspect of the ankle  Saw Dr. Darene Lamer and was given meloxicam for hip and doiung home exercises. She is still doing ankle exercises as well.   States that she still having issues with her neck and arm she sees other providers for this.  Objective: AAO x3, NAD DP/PT pulses palpable bilaterally, CRT less than 3 seconds There is mild tenderness palpation on sinus tarsi on the right side >> left.  She points to the course of the peroneal tendons where she does get discomfort at times as well.  There is no edema, erythema.  No significant ankle it appears the peroneal tendons are sitting anatomical.  Mor[place]'s, is actually on the more the anterior aspect of the ankle joint and this was where she has a clicking sensation.  Right side is worse than left. No pain with calf compression, swelling, warmth, erythema  Assessment: 62 year old female with capsulitis, tendinitis  Plan: -All treatment options discussed with the patient including all alternatives, risks, complications.  -X-rays obtained reviewed of the right foot and ankle.  Multiple views were obtained.  No evidence of acute fracture.  Lipping noted on the anterior ankle and lateral view. -She still getting discomfort in her ankle and it makes it difficult for her to stand or walk for prolonged time.  She is also not able to push or pull as this puts excess pressure on her ankle.  She may benefit from a functional capacity test physical therapy  however to work on dry needling to see if this will be helpful for her lower extremity. -We discussed getting a new MRI today however I do not notice any change the treatment plan at this time.  Trula Slade DPM

## 2022-12-04 ENCOUNTER — Encounter: Payer: Self-pay | Admitting: Podiatry

## 2022-12-05 ENCOUNTER — Other Ambulatory Visit: Payer: Self-pay | Admitting: Podiatry

## 2022-12-05 ENCOUNTER — Telehealth: Payer: Self-pay | Admitting: Podiatry

## 2022-12-05 DIAGNOSIS — G8929 Other chronic pain: Secondary | ICD-10-CM

## 2022-12-05 DIAGNOSIS — M7751 Other enthesopathy of right foot: Secondary | ICD-10-CM

## 2022-12-05 DIAGNOSIS — M19071 Primary osteoarthritis, right ankle and foot: Secondary | ICD-10-CM

## 2022-12-05 DIAGNOSIS — M779 Enthesopathy, unspecified: Secondary | ICD-10-CM

## 2022-12-05 NOTE — Telephone Encounter (Signed)
Patient is aware that information was faxed twice to [facility] and that we have confirmation that they received it.

## 2022-12-05 NOTE — Telephone Encounter (Signed)
[facility] called stating that they have not received the fax or email as of yet. They would prefer to fax it to [phone].  Please advise

## 2022-12-06 ENCOUNTER — Ambulatory Visit: Payer: Self-pay | Admitting: Sports Medicine

## 2022-12-13 ENCOUNTER — Ambulatory Visit: Payer: Medicare HMO | Admitting: Internal Medicine

## 2022-12-26 ENCOUNTER — Ambulatory Visit (INDEPENDENT_AMBULATORY_CARE_PROVIDER_SITE_OTHER): Payer: Medicare HMO

## 2022-12-26 ENCOUNTER — Ambulatory Visit (INDEPENDENT_AMBULATORY_CARE_PROVIDER_SITE_OTHER): Payer: Medicare HMO | Admitting: Sports Medicine

## 2022-12-26 DIAGNOSIS — M7061 Trochanteric bursitis, right hip: Secondary | ICD-10-CM

## 2022-12-26 MED ORDER — TRIAMCINOLONE ACETONIDE 40 MG/ML IJ SUSP
40.0000 mg | Freq: Once | INTRAMUSCULAR | Status: AC
  Administered 2022-12-26: 40 mg via INTRAMUSCULAR

## 2022-12-26 NOTE — Assessment & Plan Note (Signed)
Pleasant 62 year old female, increasing pain right lateral hip, she also has some tenderness over the ischial tuberosity. We had her do some home conditioning, did some NSAIDs without sufficient improvement though she did have influenza along the way. Due to persistence of discomfort we will proceed with interventional treatment, ultrasound-guided gluteus medius and greater trochanteric bursa injection, she will continue her conditioning, she does have an appointment coming up with her pain management provider for consideration of an ischial bursa injection, this could certainly be a secondary pain generator but I would like her to wait at least a few weeks before considering an injection into the ischial bursa.

## 2022-12-26 NOTE — Addendum Note (Signed)
Addended by: Tarri Glenn A on: 12/26/2022 10:45 AM   Modules accepted: Orders

## 2022-12-26 NOTE — Progress Notes (Signed)
    Procedures performed today:    Procedure: Real-time Ultrasound Guided injection of the right greater trochanteric bursa Device: Samsung HS60  Verbal informed consent obtained.  Time-out conducted.  Noted no overlying erythema, induration, or other signs of local infection.  Skin prepped in a sterile fashion.  Local anesthesia: Topical Ethyl chloride.  With sterile technique and under real time ultrasound guidance: Noted distended bursa, 1 cc Kenalog 40, 2 cc lidocaine, 2 cc bupivacaine injected easily Completed without difficulty  Advised to call if fevers/chills, erythema, induration, drainage, or persistent bleeding.  Images permanently stored and available for review in PACS.  Impression: Technically successful ultrasound guided injection.  Independent interpretation of notes and tests performed by another provider:   None.  Brief History, Exam, Impression, and Recommendations:    Greater trochanteric bursitis, right Pleasant 62 year old female, increasing pain right lateral hip, she also has some tenderness over the ischial tuberosity. We had her do some home conditioning, did some NSAIDs without sufficient improvement though she did have influenza along the way. Due to persistence of discomfort we will proceed with interventional treatment, ultrasound-guided gluteus medius and greater trochanteric bursa injection, she will continue her conditioning, she does have an appointment coming up with her pain management provider for consideration of an ischial bursa injection, this could certainly be a secondary pain generator but I would like her to wait at least a few weeks before considering an injection into the ischial bursa.    ____________________________________________ Gwen Her. Dianah Field, M.D., ABFM., CAQSM., AME. Primary Care and Sports Medicine [facility]  Adjunct Professor of [place] of [facility] of  Medicine  Risk manager

## 2023-01-10 ENCOUNTER — Ambulatory Visit: Payer: Medicare HMO | Admitting: Podiatry

## 2023-01-10 VITALS — BP 114/70 | HR 90

## 2023-01-10 DIAGNOSIS — M7750 Other enthesopathy of unspecified foot: Secondary | ICD-10-CM | POA: Diagnosis not present

## 2023-01-10 DIAGNOSIS — M7751 Other enthesopathy of right foot: Secondary | ICD-10-CM | POA: Diagnosis not present

## 2023-01-10 NOTE — Progress Notes (Signed)
Subjective: Chief Complaint  Patient presents with   Foot Pain    Bilateral foot pain follow-up, patient is going to P.T. dry needling, patient is having some progress, patient is having a snap at the top of the ankle, rate of pain 6 out of 73,     62 year old female presents the office today with the above concerns.  She is doing 1 physical therapy session 20 minutes to help some already.  She still getting discomfort to both ankles.  She has been getting a "good pop" sensation on the right side. The pain to the front of the ankles is a little better. The meloxicam helps some she is taking this for other issues as well.   Objective: AAO x3, NAD DP/PT pulses palpable bilaterally, CRT less than 3 seconds There is still residual tenderness palpation on sinus tarsi on the right side >> left.  She points to the course of the peroneal tendons where she does get discomfort at times as well.  There is no edema, erythema.  No significant ankle it appears the peroneal tendons are sitting anatomical.  No sign of debris noted to the anterior aspect of the ankle.  Flexor, extensor tendons appear to be intact clinically. No pain with calf compression, swelling, warmth, erythema  Assessment: 62 year old female with capsulitis, tendinitis  Plan: -All treatment options discussed with the patient including all alternatives, risks, complications.  -She has made some decent progress on the following 1 treatment with a dry needling.  Continue physical therapy.  Continue home stretching, rehab as well as shoes and good arch supports for now.  Follow-up any further surgery or MRI at this point which we discussed.  Trula Slade DPM

## 2023-01-23 ENCOUNTER — Ambulatory Visit (INDEPENDENT_AMBULATORY_CARE_PROVIDER_SITE_OTHER): Payer: Medicare HMO | Admitting: Sports Medicine

## 2023-01-23 DIAGNOSIS — E669 Obesity, unspecified: Secondary | ICD-10-CM | POA: Diagnosis not present

## 2023-01-23 DIAGNOSIS — E66811 Obesity, class 1: Secondary | ICD-10-CM

## 2023-01-23 DIAGNOSIS — M7061 Trochanteric bursitis, right hip: Secondary | ICD-10-CM | POA: Diagnosis not present

## 2023-01-23 MED ORDER — PHENTERMINE HCL 37.5 MG PO TABS: ORAL_TABLET | ORAL | 0 refills | Status: DC

## 2023-01-23 NOTE — Progress Notes (Signed)
    Procedures performed today:    None.  Independent interpretation of notes and tests performed by another provider:   None.  Brief History, Exam, Impression, and Recommendations:    Obesity (BMI 30.0-34.9) Has plateaued, would like some additional help with weight loss, she does take an occasional 10 mg Adderall explained her that while on phentermine she may not need the Adderall and to be cautious with using it. I like to see her back in a month.  We can do about 3-6 months of phentermine before tapering down.  Greater trochanteric bursitis, right Pleasant 62 year old female, increasing right lateral and posterior hip pain, we did a greater trochanteric bursa and gluteus medius injections the last visit, she had some initial partial relief but complete recurrence of pain. She does localize the pain between the lateral hip and the ischial tuberosity and sacrum. I am unsure right now as to what her exact pain generator is so we will proceed with MRI as she has failed greater than 6 weeks of physical therapy, injections, medications.    ____________________________________________ Gwen Her. Dianah Field, M.D., ABFM., CAQSM., AME. Primary Care and Sports Medicine [facility]  Adjunct Professor of [employer]  Risk manager

## 2023-01-23 NOTE — Assessment & Plan Note (Signed)
Has plateaued, would like some additional help with weight loss, she does take an occasional 10 mg Adderall explained her that while on phentermine she may not need the Adderall and to be cautious with using it. I like to see her back in a month.  We can do about 3-6 months of phentermine before tapering down.

## 2023-01-23 NOTE — Assessment & Plan Note (Signed)
Pleasant 62 year old female, increasing right lateral and posterior hip pain, we did a greater trochanteric bursa and gluteus medius injections the last visit, she had some initial partial relief but complete recurrence of pain. She does localize the pain between the lateral hip and the ischial tuberosity and sacrum. I am unsure right now as to what her exact pain generator is so we will proceed with MRI as she has failed greater than 6 weeks of physical therapy, injections, medications.

## 2023-01-30 ENCOUNTER — Encounter: Payer: Self-pay | Admitting: Sports Medicine

## 2023-01-30 DIAGNOSIS — M51369 Other intervertebral disc degeneration, lumbar region without mention of lumbar back pain or lower extremity pain: Secondary | ICD-10-CM | POA: Insufficient documentation

## 2023-01-30 DIAGNOSIS — M5136 Other intervertebral disc degeneration, lumbar region: Secondary | ICD-10-CM | POA: Insufficient documentation

## 2023-01-30 NOTE — Assessment & Plan Note (Signed)
Axial back pain, right-sided, has failed greater than 6 weeks of conservative treatment, x-rays unrevealing. Adding MRI.

## 2023-02-03 NOTE — Progress Notes (Unsigned)
FOLLOW UP Date of Service/Encounter:  02/04/23   Subjective:  Tanya Peterson (DOB: [id number]) is a 62 y.o. female who returns to the [facility] [place] on 02/04/2023 in re-evaluation of the following: asthma, allergic rhinitis, food intolerance  History obtained from: chart review and patient.  For Review, LV was on 10/15/22  with Dr.Djuna Frechette seen for routine follow-up. Cough still persistnent, had been referred to [facility] with plans for imagingn.  Did feel improvement with Symbicort 160. Recently diagnosed with reflux with improving hoarseness on medications.  We made no changes to plan and discussed consideration of asthma biologic at follow-up if no improvement.   Pertinent History/Diagnostics:  Asthma:  mild intermittent; Covid infection Dec 2022, avoiding singulair due to concerns regarding side effects - 10/30/21 spirometry: ratio 115%, 1.60L, 64% predicted FEV1 - mild restriction. --  AEC (09/12/2022): 200 -CXR on (07/25/22): impression-normal - pulm eval 01/09/23-planning for full PFTs and CT chest. - CT chest 01/28/23: 1. No compelling findings of interstitial lung disease at this time.  Nonspecific mild patchy subpleural reticulation and ground-glass opacity in the dependent lungs, without traction bronchiectasis or honeycombing, favoring hypoventilatory change. Follow-up high-resolution chest CT with prone imaging may be considered in 12 months to assess temporal pattern stability as clinically warranted.  2. A few scattered tiny solid bilateral pulmonary nodules, largest 0.3 cm.  3.  Aortic Atherosclerosis (ICD10-I70.0).  Allergic Rhinitis and conjunctivitis:  avoiding nasal sprays due to history of nosebleeds, required cauterization of right nostril on 06/19/2022  - SPT environmental panel (10/31/2019): + grasses, trees, fusarium mold, cats, dogs, mice Food intolerance (gluten and spices) Hx of reaction: Extreme sleepiness  -- SPT select foods (10/30/2021): Negative to  wheat, barley, oat, rye, karaya gum, Acacia, cinnamon, nutmeg, ginger, garlic, pepper, mustard  Today presents for follow-up. She continues using albuterol 1-2 times per week which continues to be an improvement for her.  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 She did have a recent CT and is planning on repeating in one year due to "Nonspecific mild patchy subpleural reticulation and ground-glass opacity in the dependent lungs". She is planning on following-up with pulmonary. She does feel Symbicort has been helpful for her symptoms and plans to continue using it.  She has been in a lot of pain and going to a therapist for neck and ankle. She is getting injections for hip pain but it didn't help after a few weeks. Planning on getting bilateral hips and lower lumbar MRI. She is doing exercises to help with MSK pain as well as control reflux and other symptoms.  Allergy wise-she is doing well.  She has used a mask when going outside to avoid pollen exposure.  She stays indoors a lot.  She is continuing to cut down on caffeine intake.    Allergies as of 02/04/2023       Reactions   Other Other (See Comments)   Glutein - puts to sleep -teramines - Elevated heart rate Glutein - puts to sleep -teramines - Elevated heart rate Glutein - puts to sleep -teramines - Elevated heart rate Glutein - puts to sleep -teramines - Elevated heart rate   Tyramine Palpitations   Cause Tachycardia   Lactose Diarrhea, Other (See Comments)   No Known Allergies    Lactulose Other (See Comments)   Upset stomach Upset stomach Upset stomach   Neosporin [neomycin-bacitracin Zn-polymyx] Rash        Medication List  Accurate as of February 04, 2023  1:20 PM. If you have any questions, ask your nurse or doctor.          acetaminophen 325 MG tablet Commonly known as: TYLENOL Take 650 mg by mouth every 6 (six) hours as needed.   albuterol 108 (90 Base) MCG/ACT inhaler Commonly known  as: VENTOLIN HFA Inhale 1 puff into the lungs every 30 (thirty) minutes as needed for wheezing or shortness of breath.   amphetamine-dextroamphetamine 10 MG 24 hr capsule Commonly known as: ADDERALL XR dextroamphetamine-amphetamine ER 10 mg 24hr capsule,extend release   aspirin EC 81 MG tablet Take 1 tablet (81 mg total) by mouth daily.   Biotin 5 MG Caps Take by mouth.   CALCIUM PO Take by mouth as directed.   COLLAGEN PO Take by mouth.   cyanocobalamin 1000 MCG tablet Commonly known as: VITAMIN B12 Take 1,000 mcg by mouth daily.   EPINEPHrine 0.3 mg/0.3 mL Soaj injection Commonly known as: EPI-PEN epinephrine 0.3 mg/0.3 mL injection, auto-injector   esomeprazole 20 MG capsule Commonly known as: NEXIUM Take 20 mg by mouth daily at 12 noon.   Estradiol 10 MCG Tabs vaginal tablet Place 1 tablet vaginally 2 (two) times a week.   gabapentin 100 MG capsule Commonly known as: NEURONTIN Take 100 mg by mouth 3 (three) times daily. Pt takes 2 tablets,200 mg in the morning, and 2 tablets,200 mg in the midday and 3 tablets,300 mg at night.   Garlic 123XX123 MG Caps Take by mouth.   GRAPE SEED PO Take by mouth. Grape seed with resveratrol   LORazepam 0.5 MG tablet Commonly known as: ATIVAN Take 0.5 mg by mouth. As needed.   Magnesium 400 MG Caps Take by mouth.   Melatonin 5 MG Caps Take by mouth.   meloxicam 15 MG tablet Commonly known as: MOBIC One tab PO every 24 hours with a meal for 2 weeks, then once every 24 hours prn pain.   phentermine 37.5 MG tablet Commonly known as: ADIPEX-P One tab by mouth qAM   Symbicort 160-4.5 MCG/ACT inhaler Generic drug: budesonide-formoterol Inhale 2 puffs into the lungs in the morning and at bedtime.   triamcinolone ointment 0.5 % Commonly known as: KENALOG Apply topically.   Turmeric 500 MG Tabs Take 1,000 Units by mouth.   vitamin C 1000 MG tablet Take 1,000 mg by mouth daily.   Vitamin D3 25 MCG (1000 UT)  capsule Generic drug: Cholecalciferol Take by mouth daily.   vitamin E 180 MG (400 UNITS) capsule Take 400 Units by mouth daily.   Zinc 50 MG Caps Take by mouth.       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[place])    History of kidney stones    Panic attacks    PONV (postoperative nausea and vomiting)    Sleep disorder    tx with nuvigal   Past Surgical History:  Procedure Laterality Date   BREAST EXCISIONAL BIOPSY Right 10/02/2017   papilloma   BREAST LUMPECTOMY WITH RADIOACTIVE SEED LOCALIZATION Right 10/02/2017   Procedure: RIGHT BREAST LUMPECTOMY WITH RADIOACTIVE SEED LOCALIZATION ERAS PATHWAY;  Surgeon: Jovita Kussmaul, MD;  Location: [place];  Service: General;  Laterality: Right;   COLONOSCOPY WITH PROPOFOL N/A 01/11/2015  Procedure: COLONOSCOPY WITH PROPOFOL;  Surgeon: Garlan Fair, MD;  Location: [facility];  Service: Endoscopy;  Laterality: N/A;   HYSTEROSCOPY WITH D & C N/A 03/05/2016   Procedure: DILATATION AND CURETTAGE /HYSTEROSCOPY;  Surgeon: Vanessa Kick, MD;  Location: [facility];  Service: Gynecology;  Laterality: N/A;   kdiney stone surgery  12/2015   LIPOSUCTION     TENDON REPAIR Left 06/20/2016   foot   WISDOM TOOTH EXTRACTION     Otherwise, there have been no changes to her past medical history, surgical history, family history, or social history.  ROS: All others negative except as noted per HPI.   Objective:  BP 112/68 (BP Location: Left Arm, Patient Position: Sitting, Cuff Size: Normal)   Pulse 85   Temp 98.7 F (37.1 C) (Temporal)   Resp 20   Wt 163 lb 14.4 oz (74.3 kg)   SpO2 96%   BMI 28.13 kg/m  Body mass index is 28.13 kg/m. Physical Exam: General Appearance:  Alert, cooperative, no distress, appears  stated age  Head:  Normocephalic, without obvious abnormality, atraumatic  Eyes:  Conjunctiva clear, EOM's intact  Nose: Nares normal, hypertrophic turbinates and normal mucosa  Throat: Lips, tongue normal; teeth and gums normal, normal posterior oropharynx and tonsils 2+  Neck: Supple, symmetrical  Lungs:   clear to auscultation bilaterally, Respirations unlabored, no coughing  Heart:  regular rate and rhythm and no murmur, Appears well perfused  Extremities: No edema  Skin: Skin color, texture, turgor normal, no rashes or lesions on visualized portions of skin  Neurologic: No gross deficits  Spirometry:  Tracings reviewed. Her effort: It was hard to get consistent efforts and there is a question as to whether this reflects a maximal maneuver. FVC: 1.74L FEV1: 1.92L, 78% predicted FEV1/FVC ratio: 109% Interpretation: Spirometry consistent with possible restrictive disease.  Please see scanned spirometry results for details. Assessment/Plan   Seasonal and perennial allergic rhinitis-chronic, stable - allergen avoidance towards grasses, trees, fusarium mold, cats, dogs, mice - consider allergy shots as long term control  - avoid nasal sprays due to history of nose bleeds -Consider nasal saline rinses and ayr gel as needed - Continue over the counter antihistamine daily or daily as needed.   -Your options include Zyrtec (Cetirizine) 10mg , Claritin/Alleclear (Loratadine) 10mg , Allegra (Fexofenadine) 180mg , or Xyzal (Levocetirinze) 5mg   Allergic Conjunctivitis: chronic, stable - Continue Allergy Eye drops: great options include Pataday (Olopatadine) or Zaditor (ketotifen) for eye symptoms daily as needed  Mild Persistent Asthma: controlled - Controller med: Symbicort 160 mcg 2 puffs twice daily with a spacer. Rinse mouth after use.  For respiratory illness/asthma flare: Increase Symbicort to 3 puffs twice daily. - Rescue Inhaler: Albuterol (Proair/Ventolin) 2 puffs . Use  every 4-6  hours as needed for chest tightness, wheezing, or coughing.  Can also use 15 minutes prior to exercise if you have symptoms with activity. - Asthma is not controlled if:  - Symptoms are occurring >2 times a week OR  - >2 times a month nighttime awakenings  - You are requiring systemic steroids (prednisone/steroid injections) more than once per year  - Your require hospitalization for your asthma.  - Please call the clinic to schedule a follow up if these symptoms arise  Concerns regarding food allergy Your symptoms with gluten and spices are more consistent with an intolerance, avoidance is optional, but will help you avoid unwanted symptoms  History of sensation of throat tightening after eating tyramines:  - suspect intolerance, but will prescribe  epinephrine autoinjector to be used in event of airway symptoms (throat swelling, trouble breathing, coughing, wheezing) following food ingestion - please seek immediate medical attention if using epinephrine autoinjector - please ask for a tryptase level to be drawn during a reaction if recurs  Lactose Intolerance- - choose lactose free dairy or take lactaid prior to eating dairy products that contain lactose  Reflux Continue esomeprazole as prescribed Lifestyle and diet modifications  Follow-up in 6 months, sooner if needed. It was a pleasure seeing you again in clinic today! Thank you for allowing me to participate in your care.  Sigurd Sos, MD  [facility]

## 2023-02-04 ENCOUNTER — Ambulatory Visit: Payer: Medicare HMO | Admitting: Internal Medicine

## 2023-02-04 ENCOUNTER — Other Ambulatory Visit: Payer: Self-pay

## 2023-02-04 ENCOUNTER — Encounter: Payer: Self-pay | Admitting: Internal Medicine

## 2023-02-04 VITALS — BP 112/68 | HR 85 | Temp 98.7°F | Resp 20 | Wt 163.9 lb

## 2023-02-04 DIAGNOSIS — E739 Lactose intolerance, unspecified: Secondary | ICD-10-CM

## 2023-02-04 DIAGNOSIS — J4530 Mild persistent asthma, uncomplicated: Secondary | ICD-10-CM | POA: Diagnosis not present

## 2023-02-04 DIAGNOSIS — J302 Other seasonal allergic rhinitis: Secondary | ICD-10-CM

## 2023-02-04 DIAGNOSIS — K219 Gastro-esophageal reflux disease without esophagitis: Secondary | ICD-10-CM

## 2023-02-04 DIAGNOSIS — J3089 Other allergic rhinitis: Secondary | ICD-10-CM

## 2023-02-04 DIAGNOSIS — T781XXA Other adverse food reactions, not elsewhere classified, initial encounter: Secondary | ICD-10-CM

## 2023-02-04 MED ORDER — SYMBICORT 160-4.5 MCG/ACT IN AERO: 2.0000 | INHALATION_SPRAY | Freq: Two times a day (BID) | RESPIRATORY_TRACT | 5 refills | Status: DC

## 2023-02-04 NOTE — Patient Instructions (Addendum)
Seasonal and perennial allergic rhinitis - allergen avoidance towards grasses, trees, fusarium mold, cats, dogs, mice - consider allergy shots as long term control  - avoid nasal sprays due to history of nose bleeds -Consider nasal saline rinses and ayr gel as needed - Continue over the counter antihistamine daily or daily as needed.   -Your options include Zyrtec (Cetirizine) 10mg , Claritin/Alleclear (Loratadine) 10mg , Allegra (Fexofenadine) 180mg , or Xyzal (Levocetirinze) 5mg   Allergic Conjunctivitis: - Continue Allergy Eye drops: great options include Pataday (Olopatadine) or Zaditor (ketotifen) for eye symptoms daily as needed  Mild Persistent Asthma: controlled - Controller med: Symbicort 160 mcg 2 puffs twice daily with a spacer. Rinse mouth after use.  For respiratory illness/asthma flare: Increase Symbicort to 3 puffs twice daily. - Rescue Inhaler: Albuterol (Proair/Ventolin) 2 puffs . Use  every 4-6 hours as needed for chest tightness, wheezing, or coughing.  Can also use 15 minutes prior to exercise if you have symptoms with activity. - Asthma is not controlled if:  - Symptoms are occurring >2 times a week OR  - >2 times a month nighttime awakenings  - You are requiring systemic steroids (prednisone/steroid injections) more than once per year  - Your require hospitalization for your asthma.  - Please call the clinic to schedule a follow up if these symptoms arise  Concerns regarding food allergy Your symptoms with gluten and spices are more consistent with an intolerance, avoidance is optional, but will help you avoid unwanted symptoms  History of sensation of throat tightening after eating tyramines:  - suspect intolerance, but will prescribe epinephrine autoinjector to be used in event of airway symptoms (throat swelling, trouble breathing, coughing, wheezing) following food ingestion - please seek immediate medical attention if using epinephrine autoinjector - please ask for  a tryptase level to be drawn during a reaction if recurs  Lactose Intolerance- - choose lactose free dairy or take lactaid prior to eating dairy products that contain lactose  Reflux Continue esomeprazole as prescribed Lifestyle and diet modifications  Follow-up in 6 months, sooner if needed. It was a pleasure seeing you again in clinic today! Thank you for allowing me to participate in your care.  Tanya Sos, MD [facility]   Reducing Pollen Exposure  The American Academy of Allergy, Asthma and Immunology suggests the following steps to reduce your exposure to pollen during allergy seasons.    Do not hang sheets or clothing out to dry; pollen may collect on these items. Do not mow lawns or spend time around freshly cut grass; mowing stirs up pollen. Keep windows closed at night.  Keep car windows closed while driving. Minimize morning activities outdoors, a time when pollen counts are usually at their highest. Stay indoors as much as possible when pollen counts or humidity is high and on windy days when pollen tends to remain in the air longer. Use air conditioning when possible.  Many air conditioners have filters that trap the pollen spores. Use a HEPA room air filter to remove pollen form the indoor air you breathe.

## 2023-02-10 ENCOUNTER — Ambulatory Visit (INDEPENDENT_AMBULATORY_CARE_PROVIDER_SITE_OTHER): Payer: Medicare HMO

## 2023-02-10 DIAGNOSIS — M5136 Other intervertebral disc degeneration, lumbar region: Secondary | ICD-10-CM

## 2023-02-10 DIAGNOSIS — M7061 Trochanteric bursitis, right hip: Secondary | ICD-10-CM

## 2023-02-22 ENCOUNTER — Other Ambulatory Visit: Payer: Self-pay | Admitting: Sports Medicine

## 2023-02-22 ENCOUNTER — Other Ambulatory Visit (INDEPENDENT_AMBULATORY_CARE_PROVIDER_SITE_OTHER): Payer: Medicare HMO

## 2023-02-22 ENCOUNTER — Encounter: Payer: Self-pay | Admitting: Sports Medicine

## 2023-02-22 ENCOUNTER — Ambulatory Visit (INDEPENDENT_AMBULATORY_CARE_PROVIDER_SITE_OTHER): Payer: Medicare HMO | Admitting: Sports Medicine

## 2023-02-22 DIAGNOSIS — M7061 Trochanteric bursitis, right hip: Secondary | ICD-10-CM

## 2023-02-22 DIAGNOSIS — E66811 Obesity, class 1: Secondary | ICD-10-CM

## 2023-02-22 DIAGNOSIS — E669 Obesity, unspecified: Secondary | ICD-10-CM | POA: Diagnosis not present

## 2023-02-22 DIAGNOSIS — M25551 Pain in right hip: Secondary | ICD-10-CM

## 2023-02-22 MED ORDER — HYDROCODONE-ACETAMINOPHEN 10-325 MG PO TABS: 1.0000 | ORAL_TABLET | Freq: Three times a day (TID) | ORAL | 0 refills | Status: DC | PRN

## 2023-02-22 MED ORDER — PHENTERMINE HCL 37.5 MG PO TABS
ORAL_TABLET | ORAL | 0 refills | Status: DC
Start: 2023-02-22 — End: 2023-02-22

## 2023-02-22 MED ORDER — PHENTERMINE HCL 37.5 MG PO TABS
ORAL_TABLET | ORAL | 0 refills | Status: DC
Start: 2023-02-22 — End: 2023-04-09

## 2023-02-22 NOTE — Assessment & Plan Note (Signed)
Refilling phentermine, entering the second month, 5 pound weight loss.

## 2023-02-22 NOTE — Assessment & Plan Note (Signed)
Pleasant 62 year old female, right lateral, posterior and sometimes anterior hip pain after a fall, she had a greater trochanteric bursa and gluteus medius injection back in February, some partial relief but really had recurrence of pain, we got MRIs of her hip and lumbar spine, MRI of the lumbar spine showed mostly degenerative change including facet arthropathy and desiccation of the lower lumbar disks, hip MRI did show superior and anterior labral tearing as well as some gluteus medius tendinosis. Today we sought to rule out the hip joint and labrum as a pain generator, I injected her hip joint today with ultrasound guidance. I like to see her back in about a month. She is going to get her images and most likely medical records as she does have a claim regarding her fall. Hydrocodone for postinjection pain.

## 2023-02-22 NOTE — Progress Notes (Signed)
    Procedures performed today:    Procedure: Real-time Ultrasound Guided injection of the right hip joint Device: Samsung HS60  Verbal informed consent obtained.  Time-out conducted.  Noted no overlying erythema, induration, or other signs of local infection.  Skin prepped in a sterile fashion.  Local anesthesia: Topical Ethyl chloride.  With sterile technique and under real time ultrasound guidance: Noted some synovitis, 1 cc Kenalog 40, 2 cc lidocaine, 2 cc bupivacaine injected easily Completed without difficulty  Advised to call if fevers/chills, erythema, induration, drainage, or persistent bleeding.  Images permanently stored and available for review in PACS.  Impression: Technically successful ultrasound guided injection.  Independent interpretation of notes and tests performed by another provider:   None.  Brief History, Exam, Impression, and Recommendations:    Acute right hip pain Pleasant 62 year old female, right lateral, posterior and sometimes anterior hip pain after a fall, she had a greater trochanteric bursa and gluteus medius injection back in February, some partial relief but really had recurrence of pain, we got MRIs of her hip and lumbar spine, MRI of the lumbar spine showed mostly degenerative change including facet arthropathy and desiccation of the lower lumbar disks, hip MRI did show superior and anterior labral tearing as well as some gluteus medius tendinosis. Today we sought to rule out the hip joint and labrum as a pain generator, I injected her hip joint today with ultrasound guidance. I like to see her back in about a month. She is going to get her images and most likely medical records as she does have a claim regarding her fall. Hydrocodone for postinjection pain.  Obesity (BMI 30.0-34.9) Refilling phentermine, entering the second month, 5 pound weight loss.    ____________________________________________ Ihor Austin. Benjamin Stain, M.D., ABFM.,  CAQSM., AME. Primary Care and Sports Medicine [facility]  Adjunct Professor of Family Medicine  [employer]  Restaurant manager, fast food

## 2023-02-22 NOTE — Telephone Encounter (Signed)
Pt called. She got one of two scripts.  She is missing the Hydrocodone. Please resend

## 2023-03-12 ENCOUNTER — Ambulatory Visit: Payer: Medicare HMO | Admitting: Podiatry

## 2023-03-12 DIAGNOSIS — M899 Disorder of bone, unspecified: Secondary | ICD-10-CM | POA: Diagnosis not present

## 2023-03-12 DIAGNOSIS — M949 Disorder of cartilage, unspecified: Secondary | ICD-10-CM | POA: Diagnosis not present

## 2023-03-12 DIAGNOSIS — M7750 Other enthesopathy of unspecified foot: Secondary | ICD-10-CM | POA: Diagnosis not present

## 2023-03-12 DIAGNOSIS — M7751 Other enthesopathy of right foot: Secondary | ICD-10-CM | POA: Diagnosis not present

## 2023-03-12 MED ORDER — NITROGLYCERIN 0.2 MG/HR TD PT24: 0.2000 mg | MEDICATED_PATCH | Freq: Every day | TRANSDERMAL | 1 refills | Status: DC

## 2023-03-12 NOTE — Progress Notes (Addendum)
Subjective: Chief Complaint  Patient presents with   Tendonitis    2 month follow up right ankle/foot     62 year old female presents the office today with the above concerns.  She states that the physical therapy has been helping but only lasts for couple days with pain relief.  She states that she has had some increased range of motion.  She still gets a clicking, stiff sensations reports anterior to the ankle right side worse than left.  No recent injuries that she reports.   She is still follow-up with other providers as well and she has a new tear in her hip that she recently found out about.  She sees other providers for these issues.  Objective: AAO x3, NAD DP/PT pulses palpable bilaterally, CRT less than 3 seconds There is still residual tenderness palpation on sinus tarsi on the right side >> left.  Continues to be somewhat improved today.  The majority of symptoms appear to be of anterolateral ankle when she gets a catching sensation.  Range of motion appears to be intact.  There is some slight edema present to lateral aspect of the foot bilaterally there is no erythema or warmth.  There is no areas of point tenderness. No pain with calf compression, swelling, warmth, erythema  Assessment: 62 year old female with capsulitis, tendinitis  Plan: -All treatment options discussed with the patient including all alternatives, risks, complications.  -She has previously used a  nitro patch which was helpful. I have refilled this for her and will continue with this to use as needed.   -Although she is not currently doing physical therapy specifically for the ankle (she is going for other issues) encouraged to continue with home stretching, rehab on a regular basis as well as shoes with arch support.   -I did review her previous MRI with her.  Previously she had a tiny osteochondral lesion of the right ankle.  This could have worsened which is causing some of the catching sensation to the  ankle.  We discussed further MRI and consider scoping her ankle however at this time she is to continue with treating other ailments. We can consider this in the future however we both agreed to hold off on repeat  MRI today and continue with the current treatment plan.   Vivi Barrack DPM

## 2023-03-15 ENCOUNTER — Encounter: Payer: Self-pay | Admitting: Podiatry

## 2023-03-18 NOTE — Progress Notes (Signed)
HPI: FU palpitations. Seen in the emergency room June 2017 with palpitations. ECG with sinus tachycardia and nonspecific ST changes. 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 Echocardiogram August 2017 showed normal LV systolic function. Exercise treadmill May 2018 was normal.  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  Carotid Dopplers November 2021 near normal bilaterally.  Since last seen she occasionally has dyspnea attributed to asthma.  She denies chest pain or syncope.  She continues to have occasional palpitations.  Current Outpatient Medications  Medication Sig Dispense Refill   acetaminophen (TYLENOL) 325 MG tablet Take 650 mg by mouth every 6 (six) hours as needed.     albuterol (PROVENTIL HFA;VENTOLIN HFA) 108 (90 BASE) MCG/ACT inhaler Inhale 1 puff into the lungs every 30 (thirty) minutes as needed for wheezing or shortness of breath.     amphetamine-dextroamphetamine (ADDERALL XR) 10 MG 24 hr capsule dextroamphetamine-amphetamine ER 10 mg 24hr capsule,extend release     Ascorbic Acid (VITAMIN C) 1000 MG tablet Take 1,000 mg by mouth daily.     aspirin EC 81 MG tablet Take 1 tablet (81 mg total) by mouth daily. 30 tablet 1   Bioflavonoid Products (GRAPE SEED PO) Take by mouth. Grape seed with resveratrol     Biotin 5 MG CAPS Take by mouth.     CALCIUM PO Take by mouth as directed.     Cholecalciferol (VITAMIN D3) 1000 units CAPS Take by mouth daily.     COLLAGEN PO Take by mouth.     EPINEPHrine 0.3 mg/0.3 mL IJ SOAJ injection epinephrine 0.3 mg/0.3 mL injection, auto-injector     esomeprazole (NEXIUM) 20 MG capsule Take 20 mg by mouth daily at 12 noon.     Estradiol 10 MCG TABS vaginal tablet Place 1 tablet vaginally 2 (two) times a week.     gabapentin (NEURONTIN) 100 MG capsule Take 100 mg by mouth 3 (three) times daily. Pt takes 2 tablets,200 mg in the  morning, and 2 tablets,200 mg in the midday and 3 tablets,300 mg at night.     Garlic 1000 MG CAPS Take by mouth.     HYDROcodone-acetaminophen (NORCO) 10-325 MG tablet Take 1 tablet by mouth every 8 (eight) hours as needed. 15 tablet 0   LORazepam (ATIVAN) 0.5 MG tablet Take 0.5 mg by mouth. As needed.     Magnesium 400 MG CAPS Take by mouth.     Melatonin 5 MG CAPS Take by mouth.     meloxicam (MOBIC) 15 MG tablet One tab PO every 24 hours with a meal for 2 weeks, then once every 24 hours prn pain. 30 tablet 3   nitroGLYCERIN (NITRO-DUR) 0.2 mg/hr patch Place 1 patch (0.2 mg total) onto the skin daily. Apply to ankle as needed 30 patch 1   phentermine (ADIPEX-P) 37.5 MG tablet One tab by mouth qAM 30 tablet 0   SYMBICORT 160-4.5 MCG/ACT inhaler Inhale 2 puffs into the lungs in the morning and at bedtime. 1 each 5   triamcinolone ointment (KENALOG) 0.5 % Apply topically.     Turmeric 500 MG TABS Take 1,000 Units by mouth.     vitamin B-12 (CYANOCOBALAMIN) 1000 MCG tablet Take 1,000 mcg by mouth daily.     vitamin E 180 MG (400 UNITS) capsule Take 400 Units by mouth daily.     Zinc 50 MG CAPS Take by mouth.     No current  facility-administered medications for this visit.     Past Medical History:  Diagnosis Date   ADHD (attention deficit hyperactivity disorder)    Asthma    rarely uses inhaler   Bipolar affective disorder (HCC)    Complication of anesthesia    Depression    Fibromuscular dysplasia of cervicocranial artery (HCC)    History of kidney stones    Panic attacks    PONV (postoperative nausea and vomiting)    Sleep disorder    tx with nuvigal    Past Surgical History:  Procedure Laterality Date   BREAST EXCISIONAL BIOPSY Right 10/02/2017   papilloma   BREAST LUMPECTOMY WITH RADIOACTIVE SEED LOCALIZATION Right 10/02/2017   Procedure: RIGHT BREAST LUMPECTOMY WITH RADIOACTIVE SEED LOCALIZATION ERAS PATHWAY;  Surgeon: Griselda Miner, MD;  Location: [facility];  Service: General;  Laterality: Right;   COLONOSCOPY WITH PROPOFOL N/A 01/11/2015   Procedure: COLONOSCOPY WITH PROPOFOL;  Surgeon: Charolett Bumpers, MD;  Location: [facility];  Service: Endoscopy;  Laterality: N/A;   HYSTEROSCOPY WITH D & C N/A 03/05/2016   Procedure: DILATATION AND CURETTAGE /HYSTEROSCOPY;  Surgeon: Waynard Reeds, MD;  Location: [facility];  Service: Gynecology;  Laterality: N/A;   kdiney stone surgery  12/2015   LIPOSUCTION     TENDON REPAIR Left 06/20/2016   foot   WISDOM TOOTH EXTRACTION      Social History   Socioeconomic History   Marital status: Single    Spouse name: Not on file   Number of children: Not on file   Years of education: Not on file   Highest education level: Not on file  Occupational History   Occupation: Production designer, theatre/television/film at [employer]  Tobacco Use   Smoking status: Never   Smokeless tobacco: Never  Substance and Sexual Activity   Alcohol use: No   Drug use: No   Sexual activity: Yes    Birth control/protection: Post-menopausal  Other Topics Concern   Not on file  Social History Narrative   Not on file   Social Determinants of Health   Financial Resource Strain: Not on file  Food Insecurity: Not on file  Transportation Needs: Not on file  Physical Activity: Not on file  Stress: Not on file  Social Connections: Not on file  Intimate Partner Violence: Not on file    Family History  Problem Relation Age of Onset   Alzheimer's disease Mother    Alzheimer's disease Father    Breast cancer Paternal Aunt     ROS: Arthralgias and fatigue but no fevers or chills, productive cough, hemoptysis, dysphasia, odynophagia, melena, hematochezia, dysuria, hematuria, rash, seizure activity, orthopnea, PND, pedal edema, claudication. Remaining systems are negative.  Physical Exam: Well-developed well-nourished in no acute distress.  Skin is warm and dry.  HEENT is normal.  Neck is supple.  Chest is clear to auscultation with normal expansion.   Cardiovascular exam is regular rate and rhythm.  Abdominal exam nontender or distended. No masses palpated. Extremities show no edema. neuro grossly intact  ECG-normal sinus rhythm at a rate of 92, nonspecific ST changes.  Personally reviewed  A/P  1 palpitations-no change compared to previous.  Will add beta-blockade in the future if needed.  2 fibromuscular dysplasia-most recent carotid Dopplers near normal November 2021.  3 history of chest pain-no recurrences.  4 history of coronary disease in family-we will arrange calcium score for risk stratification.  Olga Millers, MD

## 2023-03-22 ENCOUNTER — Ambulatory Visit: Payer: Medicare HMO | Admitting: Sports Medicine

## 2023-03-25 ENCOUNTER — Ambulatory Visit: Payer: Medicare HMO | Admitting: Cardiology

## 2023-03-25 ENCOUNTER — Encounter: Payer: Self-pay | Admitting: Cardiology

## 2023-03-25 VITALS — BP 118/76 | HR 92 | Ht 64.0 in | Wt 163.0 lb

## 2023-03-25 DIAGNOSIS — I773 Arterial fibromuscular dysplasia: Secondary | ICD-10-CM | POA: Diagnosis not present

## 2023-03-25 DIAGNOSIS — R072 Precordial pain: Secondary | ICD-10-CM | POA: Diagnosis not present

## 2023-03-25 DIAGNOSIS — R002 Palpitations: Secondary | ICD-10-CM

## 2023-03-25 NOTE — Patient Instructions (Signed)
    Testing/Procedures:  CORONARY CALCIUM SCORING CT SCAN AT THE [facility]   Follow-Up: At [facility], you and your health needs are our priority.  As part of our continuing mission to provide you with exceptional heart care, we have created designated Provider Care Teams.  These Care Teams include your primary Cardiologist (physician) and Advanced Practice Providers (APPs -  Physician Assistants and Nurse Practitioners) who all work together to provide you with the care you need, when you need it.  We recommend signing up for the patient portal called "MyChart".  Sign up information is provided on this After Visit Summary.  MyChart is used to connect with patients for Virtual Visits (Telemedicine).  Patients are able to view lab/test results, encounter notes, upcoming appointments, etc.  Non-urgent messages can be sent to your provider as well.   To learn more about what you can do with MyChart, go to [email or web address].    Your next appointment:   12 month(s)  Provider:   Olga Millers, MD

## 2023-03-25 NOTE — Addendum Note (Signed)
Addended by: Lacy Duverney R on: 03/25/2023 11:13 AM   Modules accepted: Orders

## 2023-04-02 ENCOUNTER — Ambulatory Visit (INDEPENDENT_AMBULATORY_CARE_PROVIDER_SITE_OTHER): Payer: Self-pay

## 2023-04-02 DIAGNOSIS — R072 Precordial pain: Secondary | ICD-10-CM

## 2023-04-03 ENCOUNTER — Telehealth: Payer: Self-pay | Admitting: *Deleted

## 2023-04-03 DIAGNOSIS — R931 Abnormal findings on diagnostic imaging of heart and coronary circulation: Secondary | ICD-10-CM

## 2023-04-03 MED ORDER — ROSUVASTATIN CALCIUM 20 MG PO TABS
20.0000 mg | ORAL_TABLET | Freq: Every day | ORAL | 3 refills | Status: DC
Start: 2023-04-03 — End: 2023-07-09

## 2023-04-03 NOTE — Telephone Encounter (Signed)
-----   Message from Lewayne Bunting, MD sent at 04/02/2023  3:39 PM EDT ----- Minimally elevated calcium score.  I had Crestor 20 mg daily.  Check lipids and liver in 8 weeks.  Mildly dilated aortic root.  Would arrange CTA to further assess in 1 year. Olga Millers

## 2023-04-03 NOTE — Telephone Encounter (Signed)
Pt has reviewed results via my chart  New script sent to the pharmacy  Lab orders mailed to the pt  

## 2023-04-09 ENCOUNTER — Encounter: Payer: Self-pay | Admitting: Sports Medicine

## 2023-04-09 ENCOUNTER — Ambulatory Visit (INDEPENDENT_AMBULATORY_CARE_PROVIDER_SITE_OTHER): Payer: Medicare HMO | Admitting: Sports Medicine

## 2023-04-09 VITALS — BP 106/70 | HR 99 | Ht 64.0 in | Wt 167.0 lb

## 2023-04-09 DIAGNOSIS — M25551 Pain in right hip: Secondary | ICD-10-CM | POA: Diagnosis not present

## 2023-04-09 DIAGNOSIS — E669 Obesity, unspecified: Secondary | ICD-10-CM | POA: Diagnosis not present

## 2023-04-09 DIAGNOSIS — E66811 Obesity, class 1: Secondary | ICD-10-CM

## 2023-04-09 MED ORDER — PHENTERMINE HCL 37.5 MG PO TABS: ORAL_TABLET | ORAL | 0 refills | Status: DC

## 2023-04-09 NOTE — Progress Notes (Signed)
    Procedures performed today:    None.  Independent interpretation of notes and tests performed by another provider:   None.  Brief History, Exam, Impression, and Recommendations:    Acute right hip pain This is a pleasant 62 year old female, she has posterior, anterior hip pain, she had greater trochanteric bursa and gluteus medius injections back in February with partial relief with recurrence of pain, MRIs of the hip and lumbar spine were obtained, she had some degenerative changes and facet arthropathy as well as desiccation of the lower lumbar disks, she had superior and anterior labral tearing on her hip MRI. We injected her hip joint at the last visit and that has provided approximately 70% relief of the anterior pain. Still has some posterior hip pain that is referable to the ischial tuberosity. I will going to add some hamstring eccentric conditioning and we can see her back in about 6 weeks for this. If persistent discomfort we will consider ischial tuberosity injection.  Obesity (BMI 30.0-34.9) Refilling phentermine, this will be the last time.    ____________________________________________ Ihor Austin. Benjamin Stain, M.D., ABFM., CAQSM., AME. Primary Care and Sports Medicine [facility]  Adjunct Professor of Family Medicine  [place] [employer] [facility] [employer]  Restaurant manager, fast food

## 2023-04-09 NOTE — Assessment & Plan Note (Signed)
This is a pleasant 62 year old female, she has posterior, anterior hip pain, she had greater trochanteric bursa and gluteus medius injections back in February with partial relief with recurrence of pain, MRIs of the hip and lumbar spine were obtained, she had some degenerative changes and facet arthropathy as well as desiccation of the lower lumbar disks, she had superior and anterior labral tearing on her hip MRI. We injected her hip joint at the last visit and that has provided approximately 70% relief of the anterior pain. Still has some posterior hip pain that is referable to the ischial tuberosity. I will going to add some hamstring eccentric conditioning and we can see her back in about 6 weeks for this. If persistent discomfort we will consider ischial tuberosity injection.

## 2023-04-09 NOTE — Patient Instructions (Signed)
Initial Hamstring Rehab Protocol Hamstring curls: Start with 3 sets of 15 (no weight); Progress by 5 reps every 3 days until you reach 3 sets of 30; After 3 days at 3 sets of 30, add 2lb ankle weight at 3 sets of 10; Increase every 5 days by 5 reps. You may add 2lbs ankle weight once weekly. Hamstring swings- swing leg backwards and curl at the end of the swing. Follow same schedule as above. Hamstring running lunges- running lunge position means no more than 45 degrees of knee flexion and running motion. Follow same schedule as above.  Advanced Hamstring Rehab Protocol Plyometrics: bound and land in running position; 3 sets of 15 level first; then up a rise; then down a rise; only advance when level is easy. No greater than 45 deg knee flexion. Stride drills- 10 x 75 yards; bounding but no more than 45 deg knee flexion. Level for 1 week; then add up hill for 1 week; then add down hill. Hop drills- 15 repetitive hops- 3 sets; compare injured to non injured leg Nordic hamstring exercises- stabilize body kneeling;1 set of 5-10 and progress slowly(Don't attempt if painful) 

## 2023-04-09 NOTE — Assessment & Plan Note (Signed)
Refilling phentermine, this will be the last time.

## 2023-05-08 ENCOUNTER — Ambulatory Visit: Payer: Medicare HMO | Admitting: Sports Medicine

## 2023-05-10 ENCOUNTER — Encounter: Payer: Self-pay | Admitting: Sports Medicine

## 2023-05-10 ENCOUNTER — Ambulatory Visit (INDEPENDENT_AMBULATORY_CARE_PROVIDER_SITE_OTHER): Payer: Medicare HMO | Admitting: Sports Medicine

## 2023-05-10 DIAGNOSIS — M25551 Pain in right hip: Secondary | ICD-10-CM | POA: Diagnosis not present

## 2023-05-10 NOTE — Assessment & Plan Note (Signed)
Pleasant 62 year old female, posterior and anterior hip pain, she has had greater trochanteric bursa and gluteus medius injections back in February with partial relief, MRIs of the hip and lumbar spine did show degenerative changes, facet arthropathy, desiccation of the lower lumbar disks, also superior and anterior labral tearing on the MRI. I injected her hip joint on the right a couple of months ago, there was approximately 70% relief of the anterior pain, though this has recurred. If this persists or becomes additionally intolerable she will need arthroscopic debridement. She also had some posterior hip pain referral to the ischial tuberosity, we added some hamstring eccentric conditioning and this has improved to some degree. Ischial tuberosity injection is still an option. We also discussed her disability, I have a copy of the forms, I am happy to draft a letter addressing each of her complaints and support of her disability.

## 2023-05-10 NOTE — Progress Notes (Signed)
    Procedures performed today:    None.  Independent interpretation of notes and tests performed by another provider:   None.  Brief History, Exam, Impression, and Recommendations:    Acute right hip pain Pleasant 62 year old female, posterior and anterior hip pain, she has had greater trochanteric bursa and gluteus medius injections back in February with partial relief, MRIs of the hip and lumbar spine did show degenerative changes, facet arthropathy, desiccation of the lower lumbar disks, also superior and anterior labral tearing on the MRI. I injected her hip joint on the right a couple of months ago, there was approximately 70% relief of the anterior pain, though this has recurred. If this persists or becomes additionally intolerable she will need arthroscopic debridement. She also had some posterior hip pain referral to the ischial tuberosity, we added some hamstring eccentric conditioning and this has improved to some degree. Ischial tuberosity injection is still an option. We also discussed her disability, I have a copy of the forms, I am happy to draft a letter addressing each of her complaints and support of her disability.    ____________________________________________ Ihor Austin. Benjamin Stain, M.D., ABFM., CAQSM., AME. Primary Care and Sports Medicine [facility]  Adjunct Professor of Family Medicine  [place] [employer] [facility] [employer]  Restaurant manager, fast food

## 2023-06-10 ENCOUNTER — Ambulatory Visit: Payer: Medicare HMO | Admitting: Podiatry

## 2023-06-10 DIAGNOSIS — M25371 Other instability, right ankle: Secondary | ICD-10-CM | POA: Diagnosis not present

## 2023-06-10 DIAGNOSIS — M25372 Other instability, left ankle: Secondary | ICD-10-CM | POA: Diagnosis not present

## 2023-06-10 DIAGNOSIS — M7750 Other enthesopathy of unspecified foot: Secondary | ICD-10-CM | POA: Diagnosis not present

## 2023-06-10 MED ORDER — NITROGLYCERIN 0.2 MG/HR TD PT24: 0.2000 mg | MEDICATED_PATCH | Freq: Every day | TRANSDERMAL | 1 refills | Status: DC

## 2023-06-10 NOTE — Progress Notes (Signed)
Subjective: Chief Complaint  Patient presents with   Ankle Pain    Pt came in today for a tendonitis 3 mo f/u. Pt also stated she is been having ankle pain that comes and go. "The pain is dull and sometimes it keeps me up at night".     62 year old female presents the office today with the above concerns.  Overall state that she still gets discomfort intermittently.  She will get periods of time where she gets improvement but short-lived and her symptoms come back.  She states that she gets a locking sensation to her ankles and she will go back to physical therapy if this was helpful.  No injuries since she was last seen.  Objective: AAO x3, NAD DP/PT pulses palpable bilaterally, CRT less than 3 seconds There is still residual tenderness palpation on sinus tarsi on the right side >> left.  On the right side down the course the peroneal tendon there is no severe to be some scar tissue noted.  There is some trace edema present to both of her lateral ankles.  There is no erythema or warmth.  No erythema or tenderness.   No pain with calf compression, swelling, warmth, erythema  Assessment: 62 year old female with capsulitis, tendinitis  Plan: -All treatment options discussed with the patient including all alternatives, risks, complications.  -Referral back to physical therapy as this was helpful for her. -I will contact [facility] for your cream to help with the scar tissue, not on the peroneal tendon on the right side. -Nitro patches do help and she will continue with this.  She gets an occasional mild headache discussed that if symptoms persist or worsen to hold off on the medication. -Continue supportive shoe gear.  Return in about 2 months (around 08/11/2023).  Vivi Barrack DPM

## 2023-06-12 ENCOUNTER — Encounter: Payer: Self-pay | Admitting: Podiatry

## 2023-06-21 ENCOUNTER — Ambulatory Visit (INDEPENDENT_AMBULATORY_CARE_PROVIDER_SITE_OTHER): Payer: Medicare HMO | Admitting: Sports Medicine

## 2023-06-21 DIAGNOSIS — M51369 Other intervertebral disc degeneration, lumbar region without mention of lumbar back pain or lower extremity pain: Secondary | ICD-10-CM

## 2023-06-21 DIAGNOSIS — M25551 Pain in right hip: Secondary | ICD-10-CM

## 2023-06-21 DIAGNOSIS — M5136 Other intervertebral disc degeneration, lumbar region: Secondary | ICD-10-CM | POA: Diagnosis not present

## 2023-06-21 MED ORDER — HYDROCODONE-ACETAMINOPHEN 10-325 MG PO TABS: 1.0000 | ORAL_TABLET | Freq: Three times a day (TID) | ORAL | 0 refills | Status: DC | PRN

## 2023-06-21 NOTE — Progress Notes (Signed)
    Procedures performed today:    None.  Independent interpretation of notes and tests performed by another provider:   None.  Brief History, Exam, Impression, and Recommendations:    Lumbar degenerative disc disease Pleasant 62 year old female returns, has axial right-sided back and buttock pain, today she did not have very much discrete tenderness over the ischial tuberosity, she does have degenerative changes in her lumbar spine and I think this is the pain generator, we will have her do some PT home and formal, refilled pain medication, at the 6-week follow-up if still having discomfort we will proceed with lumbar intervention, likely facet joint injections.  I spent 30 minutes of total time managing this patient today, this includes chart review, face to face, and non-face to face time.  ____________________________________________ Ihor Austin. Benjamin Stain, M.D., ABFM., CAQSM., AME. Primary Care and Sports Medicine [facility]  Adjunct Professor of Family Medicine  [place] [employer] [facility] [employer]  Restaurant manager, fast food

## 2023-06-21 NOTE — Assessment & Plan Note (Signed)
Pleasant 62 year old female returns, has axial right-sided back and buttock pain, today she did not have very much discrete tenderness over the ischial tuberosity, she does have degenerative changes in her lumbar spine and I think this is the pain generator, we will have her do some PT home and formal, refilled pain medication, at the 6-week follow-up if still having discomfort we will proceed with lumbar intervention, likely facet joint injections.

## 2023-06-22 ENCOUNTER — Encounter: Payer: Self-pay | Admitting: Sports Medicine

## 2023-06-27 ENCOUNTER — Encounter: Payer: Self-pay | Admitting: Cardiology

## 2023-07-09 ENCOUNTER — Telehealth: Payer: Self-pay | Admitting: Cardiology

## 2023-07-09 NOTE — Progress Notes (Signed)
HPI: FU palpitations. Seen in the emergency room June 2017 with palpitations. ECG with sinus tachycardia and nonspecific ST changes. 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 Echocardiogram August 2017 showed normal LV systolic function. Exercise treadmill May 2018 was normal.  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  Carotid Dopplers November 2021 near normal bilaterally.  Calcium score May 2024 2 which was 63rd percentile; mildly dilated ascending aorta at 41 mm.  Patient initiated on moderate dose of Crestor 20 mg daily.  Follow-up laboratories showed total cholesterol 125, HDL 71 and LDL 42.  AST 31 and ALT 29 both within normal limits.  Patient contacted the office and was extremely concerned as there was a mild increase compared to previous.  She was told that they were still normal but requested office visit and was added to my schedule.  Her Crestor was held.  Since last seen she has some dyspnea improved with Symbicort.  There is no chest pain, palpitations or syncope.  Current Outpatient Medications  Medication Sig Dispense Refill   amphetamine-dextroamphetamine (ADDERALL XR) 10 MG 24 hr capsule dextroamphetamine-amphetamine ER 10 mg 24hr capsule,extend release     Ascorbic Acid (VITAMIN C) 1000 MG tablet Take 1,000 mg by mouth daily.     aspirin EC 81 MG tablet Take 1 tablet (81 mg total) by mouth daily. 30 tablet 1   Biotin 5 MG CAPS Take by mouth.     CALCIUM PO Take by mouth as directed.     Cholecalciferol (VITAMIN D3) 1000 units CAPS Take by mouth daily.     COLLAGEN PO Take by mouth.     Estradiol 10 MCG TABS vaginal tablet Place 1 tablet vaginally 2 (two) times a week.     gabapentin (NEURONTIN) 100 MG capsule Take 100 mg by mouth 3 (three) times daily. Pt takes 2 tablets,200 mg in the morning, and 2 tablets,200 mg in the midday and 3 tablets,300 mg at night.      HYDROcodone-acetaminophen (NORCO) 10-325 MG tablet Take 1 tablet by mouth every 8 (eight) hours as needed. 15 tablet 0   LORazepam (ATIVAN) 0.5 MG tablet Take 0.5 mg by mouth. As needed.     Magnesium 400 MG CAPS Take by mouth.     nitroGLYCERIN (NITRO-DUR) 0.2 mg/hr patch Place 1 patch (0.2 mg total) onto the skin daily. Apply to ankle as needed 30 patch 1   SYMBICORT 160-4.5 MCG/ACT inhaler Inhale 2 puffs into the lungs in the morning and at bedtime. 1 each 5   triamcinolone ointment (KENALOG) 0.5 % Apply topically.     Turmeric 500 MG TABS Take 1,000 Units by mouth.     vitamin B-12 (CYANOCOBALAMIN) 1000 MCG tablet Take 1,000 mcg by mouth daily.     vitamin E 180 MG (400 UNITS) capsule Take 400 Units by mouth daily.     Zinc 50 MG CAPS Take by mouth.     acetaminophen (TYLENOL) 325 MG tablet Take 650 mg by mouth every 6 (six) hours as needed. (Patient not taking: Reported on 07/15/2023)     albuterol (PROVENTIL HFA;VENTOLIN HFA) 108 (90 BASE) MCG/ACT inhaler Inhale 1 puff into the lungs every 30 (thirty) minutes as needed for wheezing or shortness of breath. (Patient not taking: Reported on 07/15/2023)     Bioflavonoid Products (GRAPE SEED PO) Take by mouth. Grape seed with resveratrol (Patient not taking: Reported on 07/15/2023)  EPINEPHrine 0.3 mg/0.3 mL IJ SOAJ injection epinephrine 0.3 mg/0.3 mL injection, auto-injector (Patient not taking: Reported on 07/15/2023)     esomeprazole (NEXIUM) 20 MG capsule Take 20 mg by mouth daily at 12 noon. (Patient not taking: Reported on 07/15/2023)     Garlic 1000 MG CAPS Take by mouth. (Patient not taking: Reported on 07/15/2023)     Melatonin 5 MG CAPS Take by mouth. (Patient not taking: Reported on 07/15/2023)     meloxicam (MOBIC) 15 MG tablet One tab PO every 24 hours with a meal for 2 weeks, then once every 24 hours prn pain. (Patient not taking: Reported on 07/15/2023) 30 tablet 3   phentermine (ADIPEX-P) 37.5 MG tablet One tab by mouth qAM (Patient  not taking: Reported on 07/15/2023) 30 tablet 0   No current facility-administered medications for this visit.     Past Medical History:  Diagnosis Date   ADHD (attention deficit hyperactivity disorder)    Asthma    rarely uses inhaler   Bipolar affective disorder (HCC)    Complication of anesthesia    Depression    Fibromuscular dysplasia of cervicocranial artery (HCC)    History of kidney stones    Panic attacks    PONV (postoperative nausea and vomiting)    Sleep disorder    tx with nuvigal    Past Surgical History:  Procedure Laterality Date   BREAST EXCISIONAL BIOPSY Right 10/02/2017   papilloma   BREAST LUMPECTOMY WITH RADIOACTIVE SEED LOCALIZATION Right 10/02/2017   Procedure: RIGHT BREAST LUMPECTOMY WITH RADIOACTIVE SEED LOCALIZATION ERAS PATHWAY;  Surgeon: Griselda Miner, MD;  Location: [facility];  Service: General;  Laterality: Right;   COLONOSCOPY WITH PROPOFOL N/A 01/11/2015   Procedure: COLONOSCOPY WITH PROPOFOL;  Surgeon: Charolett Bumpers, MD;  Location: [facility];  Service: Endoscopy;  Laterality: N/A;   HYSTEROSCOPY WITH D & C N/A 03/05/2016   Procedure: DILATATION AND CURETTAGE /HYSTEROSCOPY;  Surgeon: Waynard Reeds, MD;  Location: [facility];  Service: Gynecology;  Laterality: N/A;   kdiney stone surgery  12/2015   LIPOSUCTION     TENDON REPAIR Left 06/20/2016   foot   WISDOM TOOTH EXTRACTION      Social History   Socioeconomic History   Marital status: Single    Spouse name: Not on file   Number of children: Not on file   Years of education: Not on file   Highest education level: Not on file  Occupational History   Occupation: Production designer, theatre/television/film at [employer]  Tobacco Use   Smoking status: Never   Smokeless tobacco: Never  Substance and Sexual Activity   Alcohol use: No   Drug use: No   Sexual activity: Yes    Birth control/protection: Post-menopausal  Other Topics Concern   Not on file  Social History Narrative   Not on file   Social  Determinants of Health   Financial Resource Strain: Not on file  Food Insecurity: Not on file  Transportation Needs: Not on file  Physical Activity: Not on file  Stress: Not on file  Social Connections: Unknown (11/09/2022)   Received from [facility]   Social Network    Social Network: Not on file  Intimate Partner Violence: Unknown (11/09/2022)   Received from [facility]   HITS    Physically Hurt: Not on file    Insult or Talk Down To: Not on file    Threaten Physical Harm: Not on file    Scream or Curse: Not on  file    Family History  Problem Relation Age of Onset   Alzheimer's disease Mother    Alzheimer's disease Father    Breast cancer Paternal Aunt     ROS: no fevers or chills, productive cough, hemoptysis, dysphasia, odynophagia, melena, hematochezia, dysuria, hematuria, rash, seizure activity, orthopnea, PND, pedal edema, claudication. Remaining systems are negative.  Physical Exam: Well-developed well-nourished in no acute distress.  Skin is warm and dry.  HEENT is normal.  Neck is supple.  Chest is clear to auscultation with normal expansion.  Cardiovascular exam is regular rate and rhythm.  Abdominal exam nontender or distended. No masses palpated. Extremities show no edema. neuro grossly intact   A/P  1 coronary calcification-mildly elevated calcium score as outlined above.  Will add Crestor 10 mg daily at her request.  She is not having chest pain.  2 fibromuscular dysplasia-most recent carotid Dopplers were near normal 2021.  3 history of chest pain-no recent recurrences.  4 mildly dilated aortic root-41 mm on recent CT.  Plan follow-up CTA May 2025.  5 hyperlipidemia-patient was concerned about the potential side effects of Crestor.  We did review her labs and her liver functions were normal recently.  She would prefer a lower dose and we have therefore resumed Crestor 10 mg daily.  She is scheduled to have blood work drawn with her primary care  in 8 weeks and will ask for the lipids and liver to be forwarded to [place].  Olga Millers, MD

## 2023-07-09 NOTE — Telephone Encounter (Signed)
Pt calling back in regards to being told she had an appt today at 11:20 am and I do not see this in the appt list and the pt does not see the appt either. Please advise.

## 2023-07-09 NOTE — Telephone Encounter (Signed)
Discussed with Dr. Jens Som. Per Dr. Jens Som, pt's hepatic function panel is WNL, but pt can discontinue Crestor if that is her preference. Plan to recheck hepatic function in 4-6 weeks and offer appointment in [place] on 08/21/23.  Spoke with patient to relay Dr. Ludwig Clarks recommendations and to apologize for the scheduling error. Pt expressed that she is extremely anxious about the "high" test results and she is adamant that she be seen today. Reassured pt that AST and ALT were within normal range, after which she stated "they're 1 point away from being abnormal." Pt repeatedly stated, "this is your [expletive] mistake and I'm coming to the office to be seen today." This RN repeatedly offered reassurance about lab results. Offered pt several other appointments this week, which she declined due to other conflicting appointments. Pt accepted an appointment with Dr. Jens Som on 07/15/23 at 3:30pm.   Pt confirmed she will stop taking Crestor. Med list updated. Pt denied any additional questions at end of call.

## 2023-07-09 NOTE — Telephone Encounter (Signed)
Patient returned call.  Explained the issue.  She states "It is not my fault"  I noted that as I said it was ours. And again she states "it's not my fault"  She states she cannot come Friday as she has arranged all her appointments around this one.  She states her anxiety is too much and "I will be seen today, if you have to double book then do it, but I will be seen today!" Message to manager and she states to see if her cardiologist will see her today (Double Book).  Message to nurse and waiting for her to speak to the doctor.

## 2023-07-09 NOTE — Telephone Encounter (Signed)
Call to patient. LM to call office.  Placed on for Friday at 8:50 with PA if she can make this appointment.  If not will cancel.  Patient was to have appointment but when she messaged back to confirm the location, the message was taken by another staff member and so unaware of her confirming.  Therefore I did not make the appointment as unaware nor did the nurse that took the response confirming the appointment.

## 2023-07-12 ENCOUNTER — Ambulatory Visit: Payer: Medicare HMO | Admitting: Student

## 2023-07-15 ENCOUNTER — Encounter: Payer: Self-pay | Admitting: Cardiology

## 2023-07-15 ENCOUNTER — Ambulatory Visit: Payer: Medicare HMO | Attending: Cardiology | Admitting: Cardiology

## 2023-07-15 VITALS — BP 122/72 | HR 97 | Ht 64.0 in | Wt 171.6 lb

## 2023-07-15 DIAGNOSIS — I773 Arterial fibromuscular dysplasia: Secondary | ICD-10-CM

## 2023-07-15 DIAGNOSIS — R002 Palpitations: Secondary | ICD-10-CM | POA: Diagnosis not present

## 2023-07-15 DIAGNOSIS — R931 Abnormal findings on diagnostic imaging of heart and coronary circulation: Secondary | ICD-10-CM

## 2023-07-15 MED ORDER — ROSUVASTATIN CALCIUM 10 MG PO TABS: 10.0000 mg | ORAL_TABLET | Freq: Every day | ORAL | 3 refills | Status: DC

## 2023-07-15 NOTE — Patient Instructions (Addendum)
Medication Instructions:  Begin Crestor 10mg .Take this medication daily.  *If you need a refill on your cardiac medications before your next appointment, please call your pharmacy*   Lab Work: If you have labs (blood work) drawn today and your tests are completely normal, you will receive your results only by: MyChart Message (if you have MyChart) OR A paper copy in the mail If you have any lab test that is abnormal or we need to change your treatment, we will call you to review the results.   Follow-Up: At [facility], you and your health needs are our priority.  As part of our continuing mission to provide you with exceptional heart care, we have created designated Provider Care Teams.  These Care Teams include your primary Cardiologist (physician) and Advanced Practice Providers (APPs -  Physician Assistants and Nurse Practitioners) who all work together to provide you with the care you need, when you need it.  We recommend signing up for the patient portal called "MyChart".  Sign up information is provided on this After Visit Summary.  MyChart is used to connect with patients for Virtual Visits (Telemedicine).  Patients are able to view lab/test results, encounter notes, upcoming appointments, etc.  Non-urgent messages can be sent to your provider as well.   To learn more about what you can do with MyChart, go to [email or web address].    Your next appointment:   1 year  Provider:   Dr. Olga Millers    Other Instructions Please have your Primary Care Provider forward your labs to your Cardiologist.

## 2023-07-18 ENCOUNTER — Ambulatory Visit: Payer: Medicare HMO | Admitting: Internal Medicine

## 2023-07-18 ENCOUNTER — Telehealth: Payer: Self-pay | Admitting: Internal Medicine

## 2023-07-18 ENCOUNTER — Encounter: Payer: Self-pay | Admitting: Internal Medicine

## 2023-07-18 VITALS — BP 118/68 | HR 87 | Temp 98.1°F | Resp 18 | Wt 171.4 lb

## 2023-07-18 DIAGNOSIS — J3089 Other allergic rhinitis: Secondary | ICD-10-CM

## 2023-07-18 DIAGNOSIS — J018 Other acute sinusitis: Secondary | ICD-10-CM

## 2023-07-18 DIAGNOSIS — K219 Gastro-esophageal reflux disease without esophagitis: Secondary | ICD-10-CM

## 2023-07-18 DIAGNOSIS — J069 Acute upper respiratory infection, unspecified: Secondary | ICD-10-CM

## 2023-07-18 DIAGNOSIS — J302 Other seasonal allergic rhinitis: Secondary | ICD-10-CM

## 2023-07-18 DIAGNOSIS — J4530 Mild persistent asthma, uncomplicated: Secondary | ICD-10-CM

## 2023-07-18 DIAGNOSIS — H1013 Acute atopic conjunctivitis, bilateral: Secondary | ICD-10-CM

## 2023-07-18 MED ORDER — METHYLPREDNISOLONE ACETATE 40 MG/ML IJ SUSP
40.0000 mg | Freq: Once | INTRAMUSCULAR | Status: AC
Start: 2023-07-18 — End: 2023-07-18
  Administered 2023-07-18: 40 mg via INTRAMUSCULAR

## 2023-07-18 MED ORDER — PREDNISONE 20 MG PO TABS: 20.0000 mg | ORAL_TABLET | Freq: Every day | ORAL | 0 refills | Status: AC

## 2023-07-18 MED ORDER — AMOXICILLIN-POT CLAVULANATE 875-125 MG PO TABS: 1.0000 | ORAL_TABLET | Freq: Two times a day (BID) | ORAL | 0 refills | Status: AC

## 2023-07-18 MED ORDER — OLOPATADINE HCL 0.2 % OP SOLN: 1.0000 [drp] | Freq: Every day | OPHTHALMIC | 3 refills | Status: AC | PRN

## 2023-07-18 NOTE — Progress Notes (Signed)
FOLLOW UP Date of Service/Encounter:  07/18/23  Subjective:  Tanya Peterson (DOB: 07-28-1961) is a 62 y.o. female who [facility] in re-evaluation of the following: Allergic rhinitis, asthma, food intolerances-here for acute visit due to congestion and headaches History obtained from: chart review and patient.  For Review, LV was on 02/04/23  with Dr.Amberrose Friebel seen for routine follow-up. See below for summary of history and diagnostics.   Therapeutic plans/changes recommended: FEV1 109%, we discussed using OTC AH, saline spray as needed, pataday PRN, symbicort 160 mcg 2 puffs BID. ----------------------------------------------------- Pertinent History/Diagnostics:  Asthma:  mild intermittent; Covid infection Dec 2022, avoiding singulair due to concerns regarding side effects - 10/30/21 spirometry: ratio 115%, 1.60L, 64% predicted FEV1 - mild restriction. --  AEC (09/12/2022): 200 -CXR on (07/25/22): impression-normal - pulm eval 01/09/23-planning for full PFTs and CT chest. - CT chest 01/28/23: 1. No compelling findings of interstitial lung disease at this time.  Nonspecific mild patchy subpleural reticulation and ground-glass opacity in the dependent lungs, without traction bronchiectasis or honeycombing, favoring hypoventilatory change. Follow-up high-resolution chest CT with prone imaging may be considered in 12 months to assess temporal pattern stability as clinically warranted.  2. A few scattered tiny solid bilateral pulmonary nodules, largest 0.3 cm.  3.  Aortic Atherosclerosis (ICD10-I70.0).  Allergic Rhinitis and conjunctivitis:  avoiding nasal sprays due to history of nosebleeds, required cauterization of right nostril on 06/19/2022 Not interested in singulair due to potential side effects.  - SPT environmental panel (10/31/2019): + grasses, trees, fusarium mold, cats, dogs, mice Food intolerance (gluten and spices) Hx of reaction: Extreme  sleepiness  -- SPT select foods (10/30/2021): Negative to wheat, barley, oat, rye, karaya gum, Acacia, cinnamon, nutmeg, ginger, garlic, pepper, mustard --------------------------------------------------- Today presents for acute visit: Congestion, headaches, dizziness. About 1.5 weeks ago, she has woken up dizzy which she thought was vertigo.  This has continued every morning since.  Symptoms are typically mild.  She is having congestion, itchy eyes, light headaches.  She has been blaming the symptoms on her allergies, but does feel fatigued.  Her eyes also feel heavy. Yesterday after coming in from outdoors, did notice that her symptoms were worse. This morning, she feels even worse. Her left nostril is completely blocked.  Her ears are congested. Her breathing seems to be doing okay.  Occasionally feels it is shallow.  She feels like it is controlled on Symbicort.  She is not using albuterol often, maybe 1-2 times per month.  She has not had any fevers.   She has increased green mucus going down back of throat starting today. She has pressure on temples.   All medications reviewed by clinical staff and updated in chart. No new pertinent medical or surgical history except as noted in HPI.  ROS: All others negative except as noted per HPI.   Objective:  BP 118/68   Pulse 87   Temp 98.1 F (36.7 C) (Temporal)   Resp 18   Wt 171 lb 6.4 oz (77.7 kg)   SpO2 98%   BMI 29.42 kg/m  Body mass index is 29.42 kg/m. Physical Exam: General Appearance:  Alert, cooperative, no distress, appears stated age  Head:  Normocephalic, without obvious abnormality, atraumatic  Eyes:  Conjunctiva clear, EOM's intact  Ears Fluid levels bilaterally and EACs normal bilaterally  Nose: Nares normal, hypertrophic turbinates, normal mucosa, and no visible anterior polyps  Throat: Lips, tongue normal; teeth and gums normal, normal posterior oropharynx  and + cobblestoning  Neck: Supple, symmetrical  Lungs:    clear to auscultation bilaterally, Respirations unlabored, no coughing  Heart:  regular rate and rhythm and no murmur, Appears well perfused  Extremities: No edema  Skin: Skin color, texture, turgor normal and no rashes or lesions on visualized portions of skin  Neurologic: No gross deficits   Labs:  Lab Orders         POC COVID-19     Assessment/Plan   Seasonal and perennial allergic rhinitis with allergy flare + sinusitis:  - rapid covid testing today was negative. - 40 mg IM depo in clinic - tomorrow start prednisone 20 mg daily for 4 days - if no improvement in 48 hours, start augmentin 875 mg twice daily for 10 days, take with probiotics (prescription printed and provided to patient in clinic)  - allergen avoidance towards grasses, trees, fusarium mold, cats, dogs, mice - consider allergy shots as long term control  - avoid nasal sprays due to history of nose bleeds -Consider nasal saline rinses and ayr gel as needed - Continue over the counter antihistamine daily or daily as needed.   -Your options include Zyrtec (Cetirizine) 10mg , Claritin/Alleclear (Loratadine) 10mg , Allegra (Fexofenadine) 180mg , or Xyzal (Levocetirinze) 5mg   Allergic Conjunctivitis: - Continue Allergy Eye drops: great options include Pataday (Olopatadine) or Zaditor (ketotifen) for eye symptoms daily as needed  Mild Persistent Asthma: controlled - Controller med: Symbicort 160 mcg 2 puffs twice daily with a spacer. Rinse mouth after use.  For respiratory illness/asthma flare: Increase Symbicort to 3 puffs twice daily. - Rescue Inhaler: Albuterol (Proair/Ventolin) 2 puffs . Use  every 4-6 hours as needed for chest tightness, wheezing, or coughing.  Can also use 15 minutes prior to exercise if you have symptoms with activity. - Asthma is not controlled if:  - Symptoms are occurring >2 times a week OR  - >2 times a month nighttime awakenings  - You are requiring systemic steroids (prednisone/steroid  injections) more than once per year  - Your require hospitalization for your asthma.  - Please call the clinic to schedule a follow up if these symptoms arise  Concerns regarding food allergy Your symptoms with gluten and spices are more consistent with an intolerance, avoidance is optional, but will help you avoid unwanted symptoms  History of sensation of throat tightening after eating tyramines:  - suspect intolerance, but will prescribe epinephrine autoinjector to be used in event of airway symptoms (throat swelling, trouble breathing, coughing, wheezing) following food ingestion - please seek immediate medical attention if using epinephrine autoinjector - please ask for a tryptase level to be drawn during a reaction if recurs  Lactose Intolerance- - choose lactose free dairy or take lactaid prior to eating dairy products that contain lactose  Reflux Continue esomeprazole as prescribed Lifestyle and diet modifications  Follow-up in 6 months, sooner if needed. It was a pleasure seeing you again in clinic today! Thank you for allowing me to participate in your care.  Other: none  Tonny Bollman, MD  [facility]

## 2023-07-18 NOTE — Telephone Encounter (Signed)
Danielle from [employer] called and stated patient needs the med filled today but the start date wouldn't be until tomorrow and requested a call back at [phone].

## 2023-07-18 NOTE — Patient Instructions (Addendum)
Seasonal and perennial allergic rhinitis with allergy flare + sinusitis:  - rapid covid testing today was negative. - 40 mg IM depo in clinic - tomorrow start prednisone 20 mg daily for 4 days - if no improvement in 48 hours, start augmentin 875 mg twice daily for 10 days, take with probiotics  - allergen avoidance towards grasses, trees, fusarium mold, cats, dogs, mice - consider allergy shots as long term control  - avoid nasal sprays due to history of nose bleeds -Consider nasal saline rinses and ayr gel as needed - Continue over the counter antihistamine daily or daily as needed.   -Your options include Zyrtec (Cetirizine) 10mg , Claritin/Alleclear (Loratadine) 10mg , Allegra (Fexofenadine) 180mg , or Xyzal (Levocetirinze) 5mg   Allergic Conjunctivitis: - Continue Allergy Eye drops: great options include Pataday (Olopatadine) or Zaditor (ketotifen) for eye symptoms daily as needed  Mild Persistent Asthma: controlled - Controller med: Symbicort 160 mcg 2 puffs twice daily with a spacer. Rinse mouth after use.  For respiratory illness/asthma flare: Increase Symbicort to 3 puffs twice daily. - Rescue Inhaler: Albuterol (Proair/Ventolin) 2 puffs . Use  every 4-6 hours as needed for chest tightness, wheezing, or coughing.  Can also use 15 minutes prior to exercise if you have symptoms with activity. - Asthma is not controlled if:  - Symptoms are occurring >2 times a week OR  - >2 times a month nighttime awakenings  - You are requiring systemic steroids (prednisone/steroid injections) more than once per year  - Your require hospitalization for your asthma.  - Please call the clinic to schedule a follow up if these symptoms arise  Concerns regarding food allergy Your symptoms with gluten and spices are more consistent with an intolerance, avoidance is optional, but will help you avoid unwanted symptoms  History of sensation of throat tightening after eating tyramines:  - suspect intolerance,  but will prescribe epinephrine autoinjector to be used in event of airway symptoms (throat swelling, trouble breathing, coughing, wheezing) following food ingestion - please seek immediate medical attention if using epinephrine autoinjector - please ask for a tryptase level to be drawn during a reaction if recurs  Lactose Intolerance- - choose lactose free dairy or take lactaid prior to eating dairy products that contain lactose  Reflux Continue esomeprazole as prescribed Lifestyle and diet modifications  Follow-up in 6 months, sooner if needed. It was a pleasure seeing you again in clinic today! Thank you for allowing me to participate in your care.  Tonny Bollman, MD [facility]   Reducing Pollen Exposure  The American Academy of Allergy, Asthma and Immunology suggests the following steps to reduce your exposure to pollen during allergy seasons.    Do not hang sheets or clothing out to dry; pollen may collect on these items. Do not mow lawns or spend time around freshly cut grass; mowing stirs up pollen. Keep windows closed at night.  Keep car windows closed while driving. Minimize morning activities outdoors, a time when pollen counts are usually at their highest. Stay indoors as much as possible when pollen counts or humidity is high and on windy days when pollen tends to remain in the air longer. Use air conditioning when possible.  Many air conditioners have filters that trap the pollen spores. Use a HEPA room air filter to remove pollen form the indoor air you breathe.

## 2023-07-18 NOTE — Telephone Encounter (Signed)
Spoke with Duwayne Heck at [place], then Clydie Braun the pharmacist who will fill the prednisone 20 mg every morning for 4 days. Patient is aware not to start the prednisone till tomorrow. Left patient a message that she can pick up her prednisone 20 mg today at [facility].

## 2023-07-19 NOTE — Telephone Encounter (Signed)
error 

## 2023-07-23 DIAGNOSIS — R260 Ataxic gait: Secondary | ICD-10-CM | POA: Diagnosis not present

## 2023-07-23 DIAGNOSIS — M25372 Other instability, left ankle: Secondary | ICD-10-CM | POA: Diagnosis not present

## 2023-07-23 DIAGNOSIS — M7750 Other enthesopathy of unspecified foot: Secondary | ICD-10-CM | POA: Diagnosis not present

## 2023-07-23 DIAGNOSIS — M25371 Other instability, right ankle: Secondary | ICD-10-CM | POA: Diagnosis not present

## 2023-07-31 DIAGNOSIS — R260 Ataxic gait: Secondary | ICD-10-CM | POA: Diagnosis not present

## 2023-07-31 DIAGNOSIS — M7750 Other enthesopathy of unspecified foot: Secondary | ICD-10-CM | POA: Diagnosis not present

## 2023-07-31 DIAGNOSIS — M25512 Pain in left shoulder: Secondary | ICD-10-CM | POA: Diagnosis not present

## 2023-07-31 DIAGNOSIS — M25511 Pain in right shoulder: Secondary | ICD-10-CM | POA: Diagnosis not present

## 2023-07-31 DIAGNOSIS — M25612 Stiffness of left shoulder, not elsewhere classified: Secondary | ICD-10-CM | POA: Diagnosis not present

## 2023-07-31 DIAGNOSIS — M47812 Spondylosis without myelopathy or radiculopathy, cervical region: Secondary | ICD-10-CM | POA: Diagnosis not present

## 2023-07-31 DIAGNOSIS — M25372 Other instability, left ankle: Secondary | ICD-10-CM | POA: Diagnosis not present

## 2023-07-31 DIAGNOSIS — M25371 Other instability, right ankle: Secondary | ICD-10-CM | POA: Diagnosis not present

## 2023-08-02 ENCOUNTER — Encounter: Payer: Self-pay | Admitting: Sports Medicine

## 2023-08-02 ENCOUNTER — Ambulatory Visit (INDEPENDENT_AMBULATORY_CARE_PROVIDER_SITE_OTHER): Payer: Medicare HMO | Admitting: Sports Medicine

## 2023-08-02 DIAGNOSIS — M51369 Other intervertebral disc degeneration, lumbar region without mention of lumbar back pain or lower extremity pain: Secondary | ICD-10-CM

## 2023-08-02 DIAGNOSIS — M5136 Other intervertebral disc degeneration, lumbar region: Secondary | ICD-10-CM | POA: Diagnosis not present

## 2023-08-02 MED ORDER — DULOXETINE HCL 30 MG PO CPEP
30.0000 mg | ORAL_CAPSULE | Freq: Every day | ORAL | 3 refills | Status: DC
Start: 2023-08-02 — End: 2023-12-05

## 2023-08-02 NOTE — Progress Notes (Addendum)
    Procedures performed today:    None.  Independent interpretation of notes and tests performed by another provider:   None.  Brief History, Exam, Impression, and Recommendations:    Lumbar degenerative disc disease Pleasant 62 year old female, axial right-sided back and buttock pain, she did not have tenderness over the ischial tuberosity, she does have significant lumbar degenerative changes. We have had her do some home PT and she has improved considerably. At this point we will have her continue the current treatment plan. We will add Cymbalta to aid with any neuropathic component of the pain, I think this will help her back pain significantly as well. If insufficient improvement at the follow-up visit we will consider multilevel facet joint injections.    ____________________________________________ Ihor Austin. Benjamin Stain, M.D., ABFM., CAQSM., AME. Primary Care and Sports Medicine [facility]  Adjunct Professor of Family Medicine  [employer]  Restaurant manager, fast food

## 2023-08-02 NOTE — Assessment & Plan Note (Addendum)
Pleasant 62 year old female, axial right-sided back and buttock pain, she did not have tenderness over the ischial tuberosity, she does have significant lumbar degenerative changes. We have had her do some home PT and she has improved considerably. At this point we will have her continue the current treatment plan. We will add Cymbalta to aid with any neuropathic component of the pain, I think this will help her back pain significantly as well. If insufficient improvement at the follow-up visit we will consider multilevel facet joint injections.

## 2023-08-12 ENCOUNTER — Ambulatory Visit: Payer: Medicare HMO | Admitting: Internal Medicine

## 2023-08-13 ENCOUNTER — Ambulatory Visit: Payer: Medicare HMO | Admitting: Podiatry

## 2023-08-13 DIAGNOSIS — F4310 Post-traumatic stress disorder, unspecified: Secondary | ICD-10-CM | POA: Diagnosis not present

## 2023-08-13 DIAGNOSIS — M25372 Other instability, left ankle: Secondary | ICD-10-CM | POA: Diagnosis not present

## 2023-08-13 DIAGNOSIS — M25371 Other instability, right ankle: Secondary | ICD-10-CM | POA: Diagnosis not present

## 2023-08-13 DIAGNOSIS — R260 Ataxic gait: Secondary | ICD-10-CM | POA: Diagnosis not present

## 2023-08-13 DIAGNOSIS — M7750 Other enthesopathy of unspecified foot: Secondary | ICD-10-CM | POA: Diagnosis not present

## 2023-08-14 ENCOUNTER — Encounter: Payer: Self-pay | Admitting: Internal Medicine

## 2023-08-14 ENCOUNTER — Ambulatory Visit: Payer: Medicare HMO | Admitting: Internal Medicine

## 2023-08-14 VITALS — BP 118/76 | HR 91 | Temp 98.1°F | Resp 20 | Ht 64.0 in

## 2023-08-14 DIAGNOSIS — J4520 Mild intermittent asthma, uncomplicated: Secondary | ICD-10-CM | POA: Diagnosis not present

## 2023-08-14 DIAGNOSIS — K219 Gastro-esophageal reflux disease without esophagitis: Secondary | ICD-10-CM | POA: Diagnosis not present

## 2023-08-14 DIAGNOSIS — J302 Other seasonal allergic rhinitis: Secondary | ICD-10-CM

## 2023-08-14 DIAGNOSIS — F902 Attention-deficit hyperactivity disorder, combined type: Secondary | ICD-10-CM | POA: Diagnosis not present

## 2023-08-14 DIAGNOSIS — J3089 Other allergic rhinitis: Secondary | ICD-10-CM

## 2023-08-14 DIAGNOSIS — H1013 Acute atopic conjunctivitis, bilateral: Secondary | ICD-10-CM | POA: Diagnosis not present

## 2023-08-14 DIAGNOSIS — F319 Bipolar disorder, unspecified: Secondary | ICD-10-CM | POA: Diagnosis not present

## 2023-08-14 DIAGNOSIS — T781XXD Other adverse food reactions, not elsewhere classified, subsequent encounter: Secondary | ICD-10-CM | POA: Diagnosis not present

## 2023-08-14 DIAGNOSIS — F411 Generalized anxiety disorder: Secondary | ICD-10-CM | POA: Diagnosis not present

## 2023-08-14 MED ORDER — SYMBICORT 160-4.5 MCG/ACT IN AERO: 2.0000 | INHALATION_SPRAY | Freq: Two times a day (BID) | RESPIRATORY_TRACT | 5 refills | Status: DC

## 2023-08-14 NOTE — Patient Instructions (Signed)
Seasonal and perennial allergic rhinitis  - allergen avoidance towards grasses, trees, fusarium mold, cats, dogs, mice - consider allergy shots as long term control - call us back if you decide to proceed with this. - avoid nasal sprays due to history of nose bleeds -Consider nasal saline rinses and ayr gel as needed - Continue over the counter antihistamine daily or daily as needed.   -Your options include Zyrtec (Cetirizine) 10mg , Claritin/Allerclear (Loratadine) 10mg , Allegra (Fexofenadine) 180mg , or Xyzal (Levocetirinze) 5mg   Allergic Conjunctivitis: - Continue Allergy Eye drops: great options include Pataday (Olopatadine) or Zaditor (ketotifen) for eye symptoms daily as needed  Mild Persistent Asthma: - Controller med: Symbicort 160 mcg 2 puffs twice daily with a spacer. Rinse mouth after use. Continue during fall/winter, can decrease to once daily once weather turns warmer.  For respiratory illness/asthma flare: Increase Symbicort to 3 puffs twice daily. - Rescue Inhaler: Albuterol (Proair/Ventolin) 2 puffs . Use  every 4-6 hours as needed for chest tightness, wheezing, or coughing.  Can also use 15 minutes prior to exercise if you have symptoms with activity. - Asthma is not controlled if:  - Symptoms are occurring >2 times a week OR  - >2 times a month nighttime awakenings  - You are requiring systemic steroids (prednisone/steroid injections) more than once per year  - Your require hospitalization for your asthma.  - Please call the clinic to schedule a follow up if these symptoms arise  Concerns regarding food allergy Your symptoms with gluten and spices are more consistent with an intolerance, avoidance is optional, but will help you avoid unwanted symptoms  History of sensation of throat tightening after eating tyramines:  - suspect intolerance, but will prescribe epinephrine autoinjector to be used in event of airway symptoms (throat swelling, trouble breathing, coughing,  wheezing) following food ingestion - please seek immediate medical attention if using epinephrine autoinjector - please ask for a tryptase level to be drawn during a reaction if recurs  Lactose Intolerance- - choose lactose free dairy or take lactaid prior to eating dairy products that contain lactose  Reflux Continue tums as needed Lifestyle and diet modifications  Follow-up in 6 months, sooner if needed. It was a pleasure seeing you again in clinic today! Thank you for allowing me to participate in your care.  Tonny Bollman, MD [facility]   Reducing Pollen Exposure  The American Academy of Allergy, Asthma and Immunology suggests the following steps to reduce your exposure to pollen during allergy seasons.    Do not hang sheets or clothing out to dry; pollen may collect on these items. Do not mow lawns or spend time around freshly cut grass; mowing stirs up pollen. Keep windows closed at night.  Keep car windows closed while driving. Minimize morning activities outdoors, a time when pollen counts are usually at their highest. Stay indoors as much as possible when pollen counts or humidity is high and on windy days when pollen tends to remain in the air longer. Use air conditioning when possible.  Many air conditioners have filters that trap the pollen spores. Use a HEPA room air filter to remove pollen form the indoor air you breathe.

## 2023-08-14 NOTE — Progress Notes (Signed)
FOLLOW UP Date of Service/Encounter:  08/14/23  Subjective:  Tanya Peterson (DOB: 1961/04/25) is a 62 y.o. female who [facility] in re-evaluation of the following: Allergic rhinitis, asthma, food intolerances  History obtained from: chart review and patient.  For Review, LV was on 07/18/23  with Dr.Abrahm Mancia seen for acute visit for acute sinusitis . See below for summary of history and diagnostics.   Therapeutic plans/changes recommended: Given 40 mg Depo in clinic and started on a prednisone oral burst plus Augmentin for 10 days. ----------------------------------------------------- Pertinent History/Diagnostics:  Asthma:  mild intermittent; Covid infection Dec 2022, avoiding singulair due to concerns regarding side effects - 10/30/21 spirometry: ratio 115%, 1.60L, 64% predicted FEV1 - mild restriction. --  AEC (09/12/2022): 200 -CXR on (07/25/22): impression-normal - pulm eval 01/09/23-planning for full PFTs and CT chest. - CT chest 01/28/23: 1. No compelling findings of interstitial lung disease at this time.  Nonspecific mild patchy subpleural reticulation and ground-glass opacity in the dependent lungs, without traction bronchiectasis or honeycombing, favoring hypoventilatory change. Follow-up high-resolution chest CT with prone imaging may be considered in 12 months to assess temporal pattern stability as clinically warranted.  2. A few scattered tiny solid bilateral pulmonary nodules, largest 0.3 cm.  3.  Aortic Atherosclerosis (ICD10-I70.0).  Allergic Rhinitis and conjunctivitis:  avoiding nasal sprays due to history of nosebleeds, required cauterization of right nostril on 06/19/2022 Not interested in singulair due to potential side effects.  - SPT environmental panel (10/31/2019): + grasses, trees, fusarium mold, cats, dogs, mice Food intolerance (gluten and spices) Hx of reaction: Extreme sleepiness  -- SPT select foods (10/30/2021):  Negative to wheat, barley, oat, rye, karaya gum, Acacia, cinnamon, nutmeg, ginger, garlic, pepper, mustard --------------------------------------------------- Today presents for follow-up. Discussed the use of AI scribe software for clinical note transcription with the patient, who gave verbal consent to proceed.  History of Present Illness   The patient, with a history of asthma and allergies, presents for follow-up after a recent sinus infection. She reports that she did end up taking the antibiotic prescribed at last visit, which she started on a Sunday, and it took about a week for her to start feeling normal again. She also took Mucinex in the beginning to help with symptoms. She is unsure of the cause of the infection, as it was not something that could be tested for.  Regarding her asthma, she is currently taking Symbicort 2 puffs twice a day and has needed the rescue inhaler once about a week and a half ago due to being outside in dry conditions. Prior to her recent course of steroids and antibiotics, she had only needed the rescue inhaler once or twice in the past five months. She reports that her use of the rescue inhaler has decreased significantly from two or three times a week to once every month or two.  The patient is also taking Allegra for allergies, having recently switched from Alertec. She has used allergy eye drops a couple of times for dryness. Other medications include gabapentin, Ativan, Adderall, and Crestor. She is not currently taking anything for reflux, as she has been trying to control it with diet and occasionally needs a Tums. She also mentions drinking mint tea daily, which she believes has helped with her symptoms.      All medications reviewed by clinical staff and updated in chart. No new pertinent medical or surgical history except as noted in HPI.  ROS: All others negative  except as noted per HPI.   Objective:  BP 118/76 (BP Location: Left Arm, Patient  Position: Sitting, Cuff Size: Normal)   Pulse 91   Temp 98.1 F (36.7 C) (Temporal)   Resp 20   Ht 5\' 4"  (1.626 m)   SpO2 97%   BMI 29.42 kg/m  Body mass index is 29.42 kg/m. Physical Exam: General Appearance:  Alert, cooperative, no distress, appears stated age  Head:  Normocephalic, without obvious abnormality, atraumatic  Eyes:  Conjunctiva clear, EOM's intact  Ears EACs normal bilaterally and normal TMs bilaterally  Nose: Nares normal, hypertrophic turbinates and normal mucosa  Throat: Lips, tongue normal; teeth and gums normal, normal posterior oropharynx  Neck: Supple, symmetrical  Lungs:   clear to auscultation bilaterally, Respirations unlabored, no coughing  Heart:  regular rate and rhythm and no murmur, Appears well perfused  Extremities: No edema  Skin: Skin color, texture, turgor normal and no rashes or lesions on visualized portions of skin  Neurologic: No gross deficits   Labs:  Lab Orders  No laboratory test(s) ordered today    Spirometry:  Tracings reviewed. Her effort: Good reproducible efforts. FVC: 2.02L FEV1: 1.74L, 71% predicted FEV1/FVC ratio: 0.86 Interpretation: Nonobstructive ratio, low FEV1, possible restriction. FEV1 low compared to previous. Please see scanned spirometry results for details.  Assessment/Plan  Seasonal and perennial allergic rhinitis -not at goal Switched from Alertec to [employer]. Occasional use of allergy eye drops. Will consider AIT. Recent sinus infection. - allergen avoidance towards grasses, trees, fusarium mold, cats, dogs, mice - consider allergy shots as long term control - call us back if you decide to  proceed with this. - avoid nasal sprays due to history of nose bleeds -Consider nasal saline rinses and ayr gel as needed - Continue over the counter antihistamine daily or daily as needed.   -Your options include Zyrtec (Cetirizine) 10mg , Claritin/Allerclear (Loratadine) 10mg , Allegra (Fexofenadine) 180mg , or Xyzal  (Levocetirinze) 5mg   Allergic Conjunctivitis: at goal - Continue Allergy Eye drops: great options include Pataday (Olopatadine) or Zaditor (ketotifen) for eye symptoms daily as needed  Mild Persistent Asthma: not at goal Reduced use of rescue inhaler to once every 1-2 months. FEV1 still decreased compared to previous visit. - Controller med: Symbicort 160 mcg 2 puffs twice daily with a spacer. Rinse mouth after use. Continue during fall/winter, can decrease to once daily once weather turns warmer.  For respiratory illness/asthma flare: Increase Symbicort to 3 puffs twice daily. - Rescue Inhaler: Albuterol (Proair/Ventolin) 2 puffs . Use  every 4-6 hours as needed for chest tightness, wheezing, or coughing.  Can also use 15 minutes prior to exercise if you have symptoms with activity. - Asthma is not controlled if:  - Symptoms are occurring >2 times a week OR  - >2 times a month nighttime awakenings  - You are requiring systemic steroids (prednisone/steroid injections) more than once per year  - Your require hospitalization for your asthma.  - Please call the clinic to schedule a follow up if these symptoms arise  Concerns regarding food allergy-stable Your symptoms with gluten and spices are more consistent with an intolerance, avoidance is optional, but will help you avoid unwanted symptoms  History of sensation of throat tightening after eating tyramines: stable - suspect intolerance, but will prescribe epinephrine autoinjector to be used in event of airway symptoms (throat swelling, trouble breathing, coughing, wheezing) following food ingestion - please seek immediate medical attention if using epinephrine autoinjector - please ask for a tryptase level to  be drawn during a reaction if recurs  Lactose Intolerance-stable - choose lactose free dairy or take lactaid prior to eating dairy products that contain lactose  Reflux-at goal Continue tums as needed Lifestyle and diet  modifications  Follow-up in 6 months, sooner if needed. It was a pleasure seeing you again in clinic today! Thank you for allowing me to participate in your care.  Other: AIT information given  Tonny Bollman, MD  [facility]

## 2023-08-19 ENCOUNTER — Ambulatory Visit: Payer: Medicare HMO | Admitting: Podiatry

## 2023-08-19 DIAGNOSIS — M7750 Other enthesopathy of unspecified foot: Secondary | ICD-10-CM

## 2023-08-19 DIAGNOSIS — M25371 Other instability, right ankle: Secondary | ICD-10-CM | POA: Diagnosis not present

## 2023-08-19 NOTE — Progress Notes (Signed)
Subjective: Chief Complaint  Patient presents with   Foot Pain    Pt states that she still has some pain      62 year old female presents the office today with the above concerns.  States that she is doing about the same.  She is to be using a cream on her ankle and she is doing physical therapy but she is also doing holistic therapy which has been helping somewhat but she still gets the symptoms to her feet.  They have not worsened but yet still remain the same.    Objective: AAO x3, NAD DP/PT pulses palpable bilaterally, CRT less than 3 seconds There is still residual tenderness palpation on sinus tarsi on the right side >> left.  On the right side down the course the peroneal tendon there is no severe to be some scar tissue noted.  He appears about the same.  There is some trace edema present to both of her lateral ankles.  There is no erythema or warmth.  No erythema or tenderness.  No pain with calf compression, swelling, warmth, erythema  Assessment: 62 year old female with capsulitis, tendinitis  Plan: -All treatment options discussed with the patient including all alternatives, risks, complications.  -She is acutely with the topical creams as well as before.  She can use nitro patches as needed as well as this was helpful somewhat.  Continue supportive shoe gear.  Again discussed MRI as well as surgery but she is not ready to proceed with surgery at this time and she has multiple other issues going on from her injury.  For UTI with conservative treatment for now.  Return in about 2 months (around 10/19/2023).  Vivi Barrack DPM

## 2023-08-26 ENCOUNTER — Encounter: Payer: Self-pay | Admitting: Podiatry

## 2023-09-12 ENCOUNTER — Ambulatory Visit (INDEPENDENT_AMBULATORY_CARE_PROVIDER_SITE_OTHER): Payer: Medicare HMO | Admitting: Sports Medicine

## 2023-09-12 ENCOUNTER — Encounter: Payer: Self-pay | Admitting: Sports Medicine

## 2023-09-12 DIAGNOSIS — M5136 Other intervertebral disc degeneration, lumbar region with discogenic back pain only: Secondary | ICD-10-CM | POA: Diagnosis not present

## 2023-09-12 NOTE — Progress Notes (Signed)
    Procedures performed today:    None.  Independent interpretation of notes and tests performed by another provider:   None.  Brief History, Exam, Impression, and Recommendations:    Lumbar degenerative disc disease Tanya Peterson returns, she is a pleasant 62 year old female with axial right-sided low back, buttock pain radiating to the lateral hip. She historically has not had tenderness over the ischial tuberosity and she does have significant lumbar spondylitic changes on lumbar spine MRI. She has also had significant intervention into the hip, a trochanteric bursa/hip abductor tendon sheath injection with ultrasound guidance only provided meager relief. Hip MRI did show labral tearing, hip joint injection provided approximately 70% relief. As her pain is predominantly posterior and lateral hip I am suspicious that some of her pain may be referred from her significant lumbar facet arthritis. We did add Cymbalta which she only took for a week. She got some drowsiness, I would like her to take it at night and try to stick with it, she understands at least 6 weeks will be needed for efficacy. If insufficient improvement with a 6-week due diligence of Cymbalta we will consider multilevel lumbar facet injections. She does understand surgery is not indicated for her type of back pain.    ____________________________________________ Ihor Austin. Benjamin Stain, M.D., ABFM., CAQSM., AME. Primary Care and Sports Medicine [facility]  Adjunct Professor of Family Medicine  [place] [employer] [facility] [employer]  Restaurant manager, fast food

## 2023-09-12 NOTE — Assessment & Plan Note (Addendum)
Mahalia returns, she is a pleasant 62 year old female with axial right-sided low back, buttock pain radiating to the lateral hip. She historically has not had tenderness over the ischial tuberosity and she does have significant lumbar spondylitic changes on lumbar spine MRI. She has also had significant intervention into the hip, a trochanteric bursa/hip abductor tendon sheath injection with ultrasound guidance only provided meager relief. Hip MRI did show labral tearing, hip joint injection provided approximately 70% relief. As her pain is predominantly posterior and lateral hip I am suspicious that some of her pain may be referred from her significant lumbar facet arthritis. We did add Cymbalta which she only took for a week. She got some drowsiness, I would like her to take it at night and try to stick with it, she understands at least 6 weeks will be needed for efficacy. If insufficient improvement with a 6-week due diligence of Cymbalta we will consider multilevel lumbar facet injections. She does understand surgery is not indicated for her type of back pain.

## 2023-10-07 ENCOUNTER — Encounter: Payer: Self-pay | Admitting: Sports Medicine

## 2023-10-21 ENCOUNTER — Encounter: Payer: Self-pay | Admitting: Podiatry

## 2023-10-21 ENCOUNTER — Ambulatory Visit: Payer: Medicare HMO | Admitting: Podiatry

## 2023-10-21 DIAGNOSIS — M25371 Other instability, right ankle: Secondary | ICD-10-CM | POA: Diagnosis not present

## 2023-10-21 DIAGNOSIS — M7751 Other enthesopathy of right foot: Secondary | ICD-10-CM

## 2023-10-21 DIAGNOSIS — M7750 Other enthesopathy of unspecified foot: Secondary | ICD-10-CM

## 2023-10-21 NOTE — Progress Notes (Unsigned)
Subjective: Chief Complaint  Patient presents with   Foot Pain    RM#14 follow up bilateral foot pain     62 year old female presents the office today with the above concerns.  Saw her last she has been doing physical therapy and she is started doing treatment types of therapy which has been more beneficial for her.  She is working on [employer], broadcasting/film/video, cupping.  She has started to see some improvement in the pain and the pain level has improved although she still does not have the strength in her legs she reports.   Objective: AAO x3, NAD DP/PT pulses palpable bilaterally, CRT less than 3 seconds There is still residual tenderness palpation on sinus tarsi on the right side >> left.  On the right side down the course the peroneal tendon there is scar tissue present this actually appears to be improved compared to what it had been previously.  There is still some residual edema but no erythema or warmth.  There is no area pinpoint tenderness.  No pain with calf compression, swelling, warmth, erythema  Assessment: 62 year old female with capsulitis, tendinitis  Plan: -All treatment options discussed with the patient including all alternatives, risks, complications.  -40 continue to current physical therapy plan as this seems to be helping from a pain standpoint and hopefully as she starts to get better strength to her lower extremities she will continue to improve.  Continue home exercises when not at physical therapy. -Continue to follow with her other providers for her other ongoing issues.  Return in about 2 months (around 12/22/2023).  Vivi Barrack DPM

## 2023-10-23 ENCOUNTER — Ambulatory Visit: Payer: Medicare HMO

## 2023-10-23 ENCOUNTER — Encounter: Payer: Self-pay | Admitting: Sports Medicine

## 2023-10-23 ENCOUNTER — Ambulatory Visit (INDEPENDENT_AMBULATORY_CARE_PROVIDER_SITE_OTHER): Payer: Medicare HMO | Admitting: Sports Medicine

## 2023-10-23 DIAGNOSIS — M25522 Pain in left elbow: Secondary | ICD-10-CM

## 2023-10-23 DIAGNOSIS — R2242 Localized swelling, mass and lump, left lower limb: Secondary | ICD-10-CM

## 2023-10-23 NOTE — Assessment & Plan Note (Signed)
Pain anteriorly reproduced with resisted supination, suspect distal biceps tendinitis. Adding x-rays, home conditioning, return in 6 weeks for this.

## 2023-10-23 NOTE — Assessment & Plan Note (Signed)
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 Will get an official ultrasound as well as MRI with and without contrast.

## 2023-10-23 NOTE — Progress Notes (Signed)
    Procedures performed today:    None.  Independent interpretation of notes and tests performed by another provider:   None.  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 Will get an official ultrasound as well as MRI with and without contrast.  Left elbow pain Pain anteriorly reproduced with resisted supination, suspect distal biceps tendinitis. Adding x-rays, home conditioning, return in 6 weeks for this.  I spent 30 minutes of total time managing this patient today, this includes chart review, face to face, and non-face to face time.  ____________________________________________ Ihor Austin. Benjamin Stain, M.D., ABFM., CAQSM., AME. Primary Care and Sports Medicine [place] [facility]  Adjunct Professor of Family Medicine  [place] [employer] [facility] [employer]  Restaurant manager, fast food

## 2023-10-25 ENCOUNTER — Ambulatory Visit: Payer: Medicare HMO

## 2023-10-25 DIAGNOSIS — R2242 Localized swelling, mass and lump, left lower limb: Secondary | ICD-10-CM | POA: Diagnosis not present

## 2023-11-04 ENCOUNTER — Ambulatory Visit (INDEPENDENT_AMBULATORY_CARE_PROVIDER_SITE_OTHER): Payer: Medicare HMO

## 2023-11-04 DIAGNOSIS — R2242 Localized swelling, mass and lump, left lower limb: Secondary | ICD-10-CM | POA: Diagnosis not present

## 2023-11-04 MED ORDER — GADOBUTROL 1 MMOL/ML IV SOLN
7.5000 mL | Freq: Once | INTRAVENOUS | Status: AC | PRN
  Administered 2023-11-04: 7.5 mL via INTRAVENOUS

## 2023-11-14 ENCOUNTER — Encounter: Payer: Self-pay | Admitting: Podiatry

## 2023-12-04 ENCOUNTER — Encounter: Payer: Self-pay | Admitting: Podiatry

## 2023-12-04 ENCOUNTER — Other Ambulatory Visit: Payer: Self-pay | Admitting: Podiatry

## 2023-12-04 MED ORDER — NITROGLYCERIN 0.2 MG/HR TD PT24: 0.2000 mg | MEDICATED_PATCH | Freq: Every day | TRANSDERMAL | 1 refills | Status: AC

## 2023-12-05 ENCOUNTER — Other Ambulatory Visit: Payer: Self-pay | Admitting: Sports Medicine

## 2023-12-05 DIAGNOSIS — M51369 Other intervertebral disc degeneration, lumbar region without mention of lumbar back pain or lower extremity pain: Secondary | ICD-10-CM

## 2023-12-23 ENCOUNTER — Ambulatory Visit: Payer: Medicare HMO | Admitting: Podiatry

## 2023-12-23 ENCOUNTER — Encounter: Payer: Self-pay | Admitting: Podiatry

## 2023-12-23 DIAGNOSIS — M25372 Other instability, left ankle: Secondary | ICD-10-CM | POA: Diagnosis not present

## 2023-12-23 DIAGNOSIS — M25371 Other instability, right ankle: Secondary | ICD-10-CM | POA: Diagnosis not present

## 2023-12-23 DIAGNOSIS — M7750 Other enthesopathy of unspecified foot: Secondary | ICD-10-CM

## 2023-12-23 MED ORDER — IBUPROFEN 800 MG PO TABS: 800.0000 mg | ORAL_TABLET | Freq: Three times a day (TID) | ORAL | 0 refills | Status: DC | PRN

## 2023-12-23 NOTE — Progress Notes (Signed)
Subjective: Chief Complaint  Patient presents with   Foot Pain    RM# 13 Follow up on tendonitis patient states now has pain in shin. Pain of ankles comes and goes. Same in discomfort since last visit wears braces help the ankle, but not the shin. The patches help temporarily.       63 year old female presents the office today with the above concerns.  She has not been able to return to physical therapy since the first of the year and that they are waiting insurance verification.  She states that over the weekend she started getting increased pain point of the anterior aspect of the left ankle on the extensor tendon, musculature.  She states that hurts to bring her foot up, dorsiflex.  She denies any calf pain or swelling of the calf.  She states it feels better when she wear shoes and hurts to go barefoot.  She is asking if this could be coming from compensation.   Objective: AAO x3, NAD DP/PT pulses palpable bilaterally, CRT less than 3 seconds There is still residual tenderness palpation on sinus tarsi on the right side > left.  This also does not on course the peroneal tendon.  States that his symptoms are most on the anterior aspect of the ankle and the extensor tendon, musculature.  There is no area pinpoint tenderness.  Decreased medial arch upon weightbearing bilaterally. No pain with calf compression, swelling, warmth, erythema  Assessment: 63 year old female with capsulitis, tendinitis  Plan: -All treatment options discussed with the patient including all alternatives, risks, complications.  -Continue physical therapy.  If she needs any referral I will be happy to send this over as this was helpful previously. -In regards to the left side discussed shoes, the arch support.  Icing, anti-inflammatories.  Refilled ibuprofen 800 mg to use as needed.  Recommend this area rest and then she starts to feel better.  Return to the stretching, rehab.  Also having therapy will be beneficial for  her.  Continue good arch support.  If this is not approved for the next couple of days to let me know or sooner if there is any worsening. I do think that some of this could be coming from compensation.  -She also brings in shoes which are evaluated.  Return in about 2 months (around 02/20/2024).  Vivi Barrack DPM

## 2024-01-19 ENCOUNTER — Encounter: Payer: Self-pay | Admitting: Podiatry

## 2024-01-20 ENCOUNTER — Other Ambulatory Visit: Payer: Self-pay | Admitting: Sports Medicine

## 2024-01-20 DIAGNOSIS — M51369 Other intervertebral disc degeneration, lumbar region without mention of lumbar back pain or lower extremity pain: Secondary | ICD-10-CM

## 2024-01-20 NOTE — Telephone Encounter (Signed)
 Can you schedule her for this week? Thanks!

## 2024-01-21 ENCOUNTER — Ambulatory Visit: Admitting: Podiatry

## 2024-01-21 ENCOUNTER — Ambulatory Visit (INDEPENDENT_AMBULATORY_CARE_PROVIDER_SITE_OTHER)

## 2024-01-21 DIAGNOSIS — M79672 Pain in left foot: Secondary | ICD-10-CM

## 2024-01-21 DIAGNOSIS — M778 Other enthesopathies, not elsewhere classified: Secondary | ICD-10-CM

## 2024-01-21 DIAGNOSIS — M7989 Other specified soft tissue disorders: Secondary | ICD-10-CM

## 2024-01-21 NOTE — Progress Notes (Unsigned)
Sx shoe 

## 2024-01-22 ENCOUNTER — Ambulatory Visit

## 2024-01-22 DIAGNOSIS — M7989 Other specified soft tissue disorders: Secondary | ICD-10-CM | POA: Diagnosis not present

## 2024-02-04 ENCOUNTER — Ambulatory Visit (INDEPENDENT_AMBULATORY_CARE_PROVIDER_SITE_OTHER)

## 2024-02-04 ENCOUNTER — Encounter: Payer: Self-pay | Admitting: Podiatry

## 2024-02-04 ENCOUNTER — Ambulatory Visit: Admitting: Podiatry

## 2024-02-04 DIAGNOSIS — M79672 Pain in left foot: Secondary | ICD-10-CM

## 2024-02-04 DIAGNOSIS — M84375D Stress fracture, left foot, subsequent encounter for fracture with routine healing: Secondary | ICD-10-CM

## 2024-02-05 ENCOUNTER — Encounter: Payer: Self-pay | Admitting: Podiatry

## 2024-02-05 NOTE — Progress Notes (Signed)
 Subjective: Chief Complaint  Patient presents with   Foot Pain    RM#11 Follow up on left foot pain is doing better but still some discomfort.    63 year old female presents outside above concerns for an acute appointment.  She presents today to discuss ultrasound results.  The bruising did resolve but she still having discomfort in left foot.  She also describes a similar bruise on the right foot without any injuries.  Objective: AAO x3, NAD DP/PT pulses palpable bilaterally, CRT less than 3 seconds There is bruising present to dorsal aspect of the left forefoot has resolved.  There is localized tenderness on the second and fourth metatarsal areas.  There is no skin edema is no erythema.  Flexor, extensor tendons intact small bruise noted more along the dorsal lateral aspect of the forefoot on the right side.  No area pinpoint tenderness. No pain with calf compression, swelling, warmth, erythema  Assessment: Bruising left foot  Plan: -All treatment options discussed with the patient including all alternatives, risks, complications.  -On the left foot repeat x-rays were obtained.  3 views of the foot were obtained.  There is no evidence of acute fracture or stress fracture identified today. -The ultrasound results.  I contacted [facility] radiology to try to get a read on this.  Continue surgical shoe for now.  Return in about 6 weeks (around 03/17/2024).  Vivi Barrack DPM

## 2024-02-19 ENCOUNTER — Other Ambulatory Visit: Payer: Self-pay

## 2024-02-19 ENCOUNTER — Ambulatory Visit: Payer: Medicare HMO | Admitting: Internal Medicine

## 2024-02-19 ENCOUNTER — Encounter: Payer: Self-pay | Admitting: Internal Medicine

## 2024-02-19 VITALS — BP 126/78 | HR 91 | Temp 97.9°F | Resp 18 | Wt 183.2 lb

## 2024-02-19 DIAGNOSIS — J302 Other seasonal allergic rhinitis: Secondary | ICD-10-CM | POA: Diagnosis not present

## 2024-02-19 DIAGNOSIS — H1013 Acute atopic conjunctivitis, bilateral: Secondary | ICD-10-CM

## 2024-02-19 DIAGNOSIS — J3089 Other allergic rhinitis: Secondary | ICD-10-CM

## 2024-02-19 DIAGNOSIS — E739 Lactose intolerance, unspecified: Secondary | ICD-10-CM | POA: Diagnosis not present

## 2024-02-19 DIAGNOSIS — J4530 Mild persistent asthma, uncomplicated: Secondary | ICD-10-CM | POA: Diagnosis not present

## 2024-02-19 DIAGNOSIS — T781XXD Other adverse food reactions, not elsewhere classified, subsequent encounter: Secondary | ICD-10-CM

## 2024-02-19 MED ORDER — ALBUTEROL SULFATE HFA 108 (90 BASE) MCG/ACT IN AERS: 1.0000 | INHALATION_SPRAY | RESPIRATORY_TRACT | 3 refills | Status: DC | PRN

## 2024-02-19 MED ORDER — EPINEPHRINE 0.3 MG/0.3ML IJ SOAJ: 0.3000 mg | INTRAMUSCULAR | 2 refills | Status: AC | PRN

## 2024-02-19 MED ORDER — SYMBICORT 160-4.5 MCG/ACT IN AERO: 2.0000 | INHALATION_SPRAY | Freq: Two times a day (BID) | RESPIRATORY_TRACT | 5 refills | Status: DC

## 2024-02-19 NOTE — Progress Notes (Signed)
 FOLLOW UP Date of Service/Encounter:  02/19/24  Subjective:  Tanya Peterson (DOB: [birth year]) is a 63 y.o. female who [facility] in re-evaluation of the following: Allergic rhinitis, asthma, food intolerances  History obtained from: chart review and patient.  For Review, LV was on 08/14/23  with Dr.Darra Rosa seen for routine follow-up. See below for summary of history and diagnostics.   Therapeutic plans/changes recommended: Reduced use of rescue inhaler to once every 1-2 months. FEV1 still decreased compared to previous visit. Continue on Symbicort 160 2 puffs BID ----------------------------------------------------- Pertinent History/Diagnostics:  Asthma:  mild intermittent; Covid infection Dec 2022, avoiding singulair due to concerns regarding side effects - 10/30/21 spirometry: ratio 115%, 1.60L, 64% predicted FEV1 - mild restriction. --  AEC (09/12/2022): 200 -CXR on (07/25/22): impression-normal - pulm eval 01/09/23-planning for full PFTs and CT chest. - CT chest 01/28/23: 1. No compelling findings of interstitial lung disease at this time.  Nonspecific mild patchy subpleural reticulation and ground-glass opacity in the dependent lungs, without traction bronchiectasis or honeycombing, favoring hypoventilatory change. Follow-up high-resolution chest CT with prone imaging may be considered in 12 months to assess temporal pattern stability as clinically warranted.  2. A few scattered tiny solid bilateral pulmonary nodules, largest 0.3 cm.  3.  Aortic Atherosclerosis (ICD10-I70.0).  Allergic Rhinitis and conjunctivitis:  avoiding nasal sprays due to history of nosebleeds, required cauterization of right nostril on 06/19/2022 Not interested in singulair due to potential side effects.  - SPT environmental panel (10/30/2021): + grasses, trees, fusarium mold, cats, dogs, mice Food intolerance (gluten and spices) Hx of reaction: Extreme sleepiness   -- SPT select foods (10/30/2021): Negative to wheat, barley, oat, rye, karaya gum, Acacia, cinnamon, nutmeg, ginger, garlic, pepper, mustard --------------------------------------------------- Today presents for follow-up.  History of Present Illness   Tanya Peterson "Tanya Peterson" is a 63 year old female with asthma who presents for allergy and asthma management.   She has experienced an overall improvement in her asthma symptoms and has not needed to use her rescue inhaler recently. During the winter months, she increased her Symbicort usage to three puffs at night, which she believes helped prevent pneumonia. She has since reduced her dosage to once daily in the morning as the weather has warmed. She has not required prednisone or antibiotics since September 2024.  Last week, she experienced a severe allergy attack characterized by sneezing every ten minutes for three days. She used her inhaler and other measures to manage the symptoms. She recalls receiving allergy shots in her twenties but does not remember the specifics of the treatment plan.  She is currently using Symbicort 160, two puffs in the morning.   Her asthma is primarily problematic during cold weather. She notes that when she is in [place], she does not experience any breathing issues. She is considering relocating to a warmer climate to manage her symptoms better. She has a long-standing connection to [place], having lived there for 14-15 years previously.     Her allergy symptoms are not controlled.  She is currently taking Allegra, but considering switching to a different antihistamine.  She cannot take more than 1 a day because they all make her energized and she cannot get good sleep.  We did discuss using Benadryl as needed at night for breakthrough symptoms.  She does not tolerate nasal sprays well due to nasal dryness and history of epistaxis requiring cauterization.  She is interested in restarting allergy injections.   All  medications  reviewed by clinical staff and updated in chart. No new pertinent medical or surgical history except as noted in HPI.  ROS: All others negative except as noted per HPI.   Objective:  BP 126/78   Pulse 91   Temp 97.9 F (36.6 C) (Temporal)   Resp 18   Wt 183 lb 3.2 oz (83.1 kg)   SpO2 97%   BMI 31.45 kg/m  Body mass index is 31.45 kg/m. Physical Exam: General Appearance:  Alert, cooperative, no distress, appears stated age  Head:  Normocephalic, without obvious abnormality, atraumatic  Eyes:  Conjunctiva clear, EOM's intact  Ears EACs normal bilaterally and normal TMs bilaterally  Nose: Nares normal, hypertrophic turbinates, normal mucosa, and no visible anterior polyps  Throat: Lips, tongue normal; teeth and gums normal, normal posterior oropharynx  Neck: Supple, symmetrical  Lungs:   clear to auscultation bilaterally, Respirations unlabored, no coughing  Heart:  regular rate and rhythm and no murmur, Appears well perfused  Extremities: No edema  Skin: Skin color, texture, turgor normal and no rashes or lesions on visualized portions of skin  Neurologic: No gross deficits   Labs:  Lab Orders  No laboratory test(s) ordered today    Spirometry:  Tracings reviewed. Her effort: Good reproducible efforts. FVC: 2.64L FEV1: 2.11L, 86% predicted FEV1/FVC ratio: 0.80 Interpretation: Spirometry consistent with normal pattern.  Please see scanned spirometry results for details.  Assessment/Plan   Seasonal and perennial allergic rhinitis-not at goal, consider AIT - allergen avoidance towards grasses, trees, fusarium mold, cats, dogs, mice - consider allergy shots as long term control - call us back if you decide to proceed with this. Will need to update your allergy testing - avoid nasal sprays due to history of nose bleeds -Consider nasal saline rinses and ayr gel as needed - Continue over the counter antihistamine daily or daily as needed.  Can take an extra dose  for breakthrough symptoms -Your options include Zyrtec (Cetirizine) 10mg , Claritin/Allerclear (Loratadine) 10mg , Allegra (Fexofenadine) 180mg , or Xyzal (Levocetirinze) 5mg   Allergic Conjunctivitis: Not at goal, consider AIT - Continue Allergy Eye drops: great options include Pataday (Olopatadine) or Zaditor (ketotifen) for eye symptoms daily as needed  Mild Persistent Asthma: eosinophilic-currently controlled but problematic during cold weather months - Controller med: Symbicort 160 mcg 2 puffs twice daily with a spacer. Rinse mouth after use. Continue during fall/winter, can decrease to once daily once weather turns warmer.  For respiratory illness/asthma flare: Increase Symbicort to 3 puffs twice daily for 1-2 weeks until symptoms resolve. - Rescue Inhaler: Albuterol (Proair/Ventolin) 2 puffs . Use  every 4-6 hours as needed for chest tightness, wheezing, or coughing.  Can also use 15 minutes prior to exercise if you have symptoms with activity. - Asthma is not controlled if:  - Symptoms are occurring >2 times a week OR  - >2 times a month nighttime awakenings  - You are requiring systemic steroids (prednisone/steroid injections) more than once per year  - Your require hospitalization for your asthma.  - Please call the clinic to schedule a follow up if these symptoms arise Consider an injectable asthma medication such as fasenra or nucala-will help reduce eosinophils which are contributing to your eosinophils  Concerns regarding food allergy-intolerances/stable Your symptoms with gluten and spices are more consistent with an intolerance, avoidance is optional, but will help you avoid unwanted symptoms  History of sensation of throat tightening after eating tyramines: Stable - suspect intolerance, but will prescribe epinephrine autoinjector to be used in event of  airway symptoms (throat swelling, trouble breathing, coughing, wheezing) following food ingestion - please seek immediate medical  attention if using epinephrine autoinjector - please ask for a tryptase level to be drawn during a reaction if recurs  Lactose Intolerance-stable - choose lactose free dairy or take lactaid prior to eating dairy products that contain lactose  Reflux-at goal Continue tums as needed Lifestyle and diet modifications  Follow-up in 6 months, sooner if needed. It was a pleasure seeing you again in clinic today! Thank you for allowing me to participate in your care.  Other: none  Tonny Bollman, MD  [facility]

## 2024-02-19 NOTE — Patient Instructions (Addendum)
 Seasonal and perennial allergic rhinitis  - allergen avoidance towards grasses, trees, fusarium mold, cats, dogs, mice - consider allergy shots as long term control - call us back if you decide to proceed with this. Will need to update your allergy testing - avoid nasal sprays due to history of nose bleeds -Consider nasal saline rinses and ayr gel as needed - Continue over the counter antihistamine daily or daily as needed.  Can take an extra dose for breakthrough symptoms -Your options include Zyrtec (Cetirizine) 10mg , Claritin/Allerclear (Loratadine) 10mg , Allegra (Fexofenadine) 180mg , or Xyzal (Levocetirinze) 5mg   Allergic Conjunctivitis: - Continue Allergy Eye drops: great options include Pataday (Olopatadine) or Zaditor (ketotifen) for eye symptoms daily as needed  Mild Persistent Asthma: eosinophilic - Controller med: Symbicort 160 mcg 2 puffs twice daily with a spacer. Rinse mouth after use. Continue during fall/winter, can decrease to once daily once weather turns warmer.  For respiratory illness/asthma flare: Increase Symbicort to 3 puffs twice daily for 1-2 weeks until symptoms resolve. - Rescue Inhaler: Albuterol (Proair/Ventolin) 2 puffs . Use  every 4-6 hours as needed for chest tightness, wheezing, or coughing.  Can also use 15 minutes prior to exercise if you have symptoms with activity. - Asthma is not controlled if:  - Symptoms are occurring >2 times a week OR  - >2 times a month nighttime awakenings  - You are requiring systemic steroids (prednisone/steroid injections) more than once per year  - Your require hospitalization for your asthma.  - Please call the clinic to schedule a follow up if these symptoms arise Consider an injectable asthma medication such as fasenra or nucala-will help reduce eosinophils which are contributing to your eosinophils  Concerns regarding food allergy Your symptoms with gluten and spices are more consistent with an intolerance, avoidance is  optional, but will help you avoid unwanted symptoms  History of sensation of throat tightening after eating tyramines:  - suspect intolerance, but will prescribe epinephrine autoinjector to be used in event of airway symptoms (throat swelling, trouble breathing, coughing, wheezing) following food ingestion - please seek immediate medical attention if using epinephrine autoinjector - please ask for a tryptase level to be drawn during a reaction if recurs  Lactose Intolerance- - choose lactose free dairy or take lactaid prior to eating dairy products that contain lactose  Reflux Continue tums as needed Lifestyle and diet modifications  Follow-up in 6 months, sooner if needed. It was a pleasure seeing you again in clinic today! Thank you for allowing me to participate in your care.  Tonny Bollman, MD [facility]   Reducing Pollen Exposure  The American Academy of Allergy, Asthma and Immunology suggests the following steps to reduce your exposure to pollen during allergy seasons.    Do not hang sheets or clothing out to dry; pollen may collect on these items. Do not mow lawns or spend time around freshly cut grass; mowing stirs up pollen. Keep windows closed at night.  Keep car windows closed while driving. Minimize morning activities outdoors, a time when pollen counts are usually at their highest. Stay indoors as much as possible when pollen counts or humidity is high and on windy days when pollen tends to remain in the air longer. Use air conditioning when possible.  Many air conditioners have filters that trap the pollen spores. Use a HEPA room air filter to remove pollen form the indoor air you breathe.

## 2024-02-20 ENCOUNTER — Ambulatory Visit: Payer: Medicare HMO | Admitting: Podiatry

## 2024-03-03 ENCOUNTER — Telehealth: Payer: Self-pay | Admitting: Cardiology

## 2024-03-03 DIAGNOSIS — R931 Abnormal findings on diagnostic imaging of heart and coronary circulation: Secondary | ICD-10-CM

## 2024-03-03 DIAGNOSIS — R002 Palpitations: Secondary | ICD-10-CM

## 2024-03-03 NOTE — Telephone Encounter (Signed)
 Pt would like a blood work request to be sent to [facility] so she's doesn't have to wait in the office when she arrives. Please give pt a c/b once completed

## 2024-03-05 NOTE — Telephone Encounter (Signed)
 Left message for patient, lab slips placed in the mail.

## 2024-03-09 NOTE — Progress Notes (Unsigned)
 HPI: FU palpitations. Seen in the emergency room June 2017 with palpitations. ECG with sinus tachycardia and nonspecific ST changes. 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 Echocardiogram August 2017 showed normal LV systolic function. Exercise treadmill May 2018 was normal.  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  Carotid Dopplers November 2021 near normal bilaterally.  Calcium  score May 2024 2 which was 63rd percentile; mildly dilated ascending aorta at 41 mm.  Note patient recently had labs checked and potassium was 5.8 with creatinine 0.66.  Follow-up was 5.9.  She was given Lokelma  10 g twice daily for 2 doses.  Follow-up potassium was 4.9.  Since last seen   Current Outpatient Medications  Medication Sig Dispense Refill   acetaminophen  (TYLENOL ) 325 MG tablet Take 650 mg by mouth every 6 (six) hours as needed.     albuterol  (VENTOLIN  HFA) 108 (90 Base) MCG/ACT inhaler Inhale 1 puff into the lungs every 4 (four) hours as needed for wheezing or shortness of breath. 18 g 3   amphetamine-dextroamphetamine (ADDERALL XR) 10 MG 24 hr capsule dextroamphetamine-amphetamine ER 10 mg 24hr capsule,extend release     Ascorbic Acid (VITAMIN C) 1000 MG tablet Take 1,000 mg by mouth daily.     aspirin  EC 81 MG tablet Take 1 tablet (81 mg total) by mouth daily. 30 tablet 1   Bioflavonoid Products (GRAPE SEED PO) Take by mouth. Grape seed with resveratrol     Biotin 5 MG CAPS Take by mouth.     CALCIUM  PO Take by mouth as directed.     Cholecalciferol (VITAMIN D3) 1000 units CAPS Take by mouth daily.     COLLAGEN PO Take by mouth.     DULoxetine  (CYMBALTA ) 30 MG capsule TAKE ONE CAPSULE BY MOUTH ONCE A DAY 90 capsule 3   EPINEPHrine  0.3 mg/0.3 mL IJ SOAJ injection Inject 0.3 mg into the muscle as needed for anaphylaxis. 2 each 2   esomeprazole (NEXIUM) 20 MG capsule Take 20 mg by  mouth daily at 12 noon.     Estradiol 10 MCG TABS vaginal tablet Place 1 tablet vaginally 2 (two) times a week.     gabapentin  (NEURONTIN ) 100 MG capsule Take 100 mg by mouth 3 (three) times daily. Pt takes 2 tablets,200 mg in the morning, and 2 tablets,200 mg in the midday and 3 tablets,300 mg at night.     Garlic 1000 MG CAPS Take by mouth.     HYDROcodone -acetaminophen  (NORCO) 10-325 MG tablet Take 1 tablet by mouth every 8 (eight) hours as needed. 15 tablet 0   ibuprofen  (ADVIL ) 800 MG tablet Take 1 tablet (800 mg total) by mouth every 8 (eight) hours as needed. 30 tablet 0   LORazepam  (ATIVAN ) 0.5 MG tablet Take 0.5 mg by mouth. As needed.     Magnesium 400 MG CAPS Take by mouth.     Melatonin 5 MG CAPS Take by mouth.     nitroGLYCERIN  (NITRO-DUR ) 0.2 mg/hr patch Place 1 patch (0.2 mg total) onto the skin daily. Apply to ankle as needed 30 patch 1   Olopatadine  HCl 0.2 % SOLN Apply 1 drop to eye daily as needed. 2.5 mL 3   phentermine  (ADIPEX-P ) 37.5 MG tablet One tab by mouth qAM 30 tablet 0   rosuvastatin  (CRESTOR ) 10 MG tablet Take 1 tablet (10 mg total) by mouth daily. 90 tablet 3   SYMBICORT  160-4.5 MCG/ACT inhaler  Inhale 2 puffs into the lungs in the morning and at bedtime. Follow allergy action plan. 1 each 5   triamcinolone  ointment (KENALOG ) 0.5 % Apply topically.     Turmeric 500 MG TABS Take 1,000 Units by mouth.     vitamin B-12 (CYANOCOBALAMIN) 1000 MCG tablet Take 1,000 mcg by mouth daily.     vitamin E 180 MG (400 UNITS) capsule Take 400 Units by mouth daily.     Zinc 50 MG CAPS Take by mouth.     No current facility-administered medications for this visit.     Past Medical History:  Diagnosis Date   ADHD (attention deficit hyperactivity disorder)    Asthma    rarely uses inhaler   Bipolar affective disorder (HCC)    Complication of anesthesia    Depression    Fibromuscular dysplasia of cervicocranial artery (HCC)    History of kidney stones    Panic attacks     PONV (postoperative nausea and vomiting)    Sleep disorder    tx with nuvigal    Past Surgical History:  Procedure Laterality Date   BREAST EXCISIONAL BIOPSY Right 10/02/2017   papilloma   BREAST LUMPECTOMY WITH RADIOACTIVE SEED LOCALIZATION Right 10/02/2017   Procedure: RIGHT BREAST LUMPECTOMY WITH RADIOACTIVE SEED LOCALIZATION ERAS PATHWAY;  Surgeon: Caralyn Chandler, MD;  Location: [facility];  Service: General;  Laterality: Right;   COLONOSCOPY WITH PROPOFOL  N/A 01/11/2015   Procedure: COLONOSCOPY WITH PROPOFOL ;  Surgeon: Garrett Kallman, MD;  Location: [facility];  Service: Endoscopy;  Laterality: N/A;   HYSTEROSCOPY WITH D & C N/A 03/05/2016   Procedure: DILATATION AND CURETTAGE /HYSTEROSCOPY;  Surgeon: Reggy Capers, MD;  Location: [facility];  Service: Gynecology;  Laterality: N/A;   kdiney stone surgery  12/2015   LIPOSUCTION     TENDON REPAIR Left 06/20/2016   foot   WISDOM TOOTH EXTRACTION      Social History   Socioeconomic History   Marital status: Single    Spouse name: Not on file   Number of children: Not on file   Years of education: Not on file   Highest education level: Not on file  Occupational History   Occupation: Production designer, theatre/television/film at [employer]  Tobacco Use   Smoking status: Never    Passive exposure: Never   Smokeless tobacco: Never  Vaping Use   Vaping status: Never Used  Substance and Sexual Activity   Alcohol use: No   Drug use: No   Sexual activity: Yes    Birth control/protection: Post-menopausal  Other Topics Concern   Not on file  Social History Narrative   Not on file   Social Drivers of Health   Financial Resource Strain: Not on file  Food Insecurity: Low Risk  (02/12/2024)   Received from [facility]   Hunger Vital Sign    Worried About Running Out of Food in the Last Year: Never true    Ran Out of Food in the Last Year: Never true  Transportation Needs: No Transportation Needs (02/12/2024)   Received from [facility]    In the past 12 months, has lack of reliable transportation kept you from medical appointments, meetings, work or from getting things needed for daily living? : No  Physical Activity: Not on file  Stress: Not on file  Social Connections: Unknown (11/09/2022)   [facility] from [facility], [facility]   Social Network    Social Network: Not on file  Intimate  Partner Violence: Unknown (11/09/2022)   [facility] from [facility], [facility]   HITS    Physically Hurt: Not on file    Insult or Talk Down To: Not on file    Threaten Physical Harm: Not on file    Scream or Curse: Not on file    Family History  Problem Relation Age of Onset   Alzheimer's disease Mother    Alzheimer's disease Father    Breast cancer Paternal Aunt     ROS: no fevers or chills, productive cough, hemoptysis, dysphasia, odynophagia, melena, hematochezia, dysuria, hematuria, rash, seizure activity, orthopnea, PND, pedal edema, claudication. Remaining systems are negative.  Physical Exam: Well-developed well-nourished in no acute distress.  Skin is warm and dry.  HEENT is normal.  Neck is supple.  Chest is clear to auscultation with normal expansion.  Cardiovascular exam is regular rate and rhythm.  Abdominal exam nontender or distended. No masses palpated. Extremities show no edema. neuro grossly intact  ECG- personally reviewed  A/P  1 coronary calcification-patient denies chest pain.  Plan to continue statin at present dose.  2 history of dilated aortic root-plan follow-up CTA May 2025.  3 fibromuscular dysplasia-most recent carotid Dopplers near normal.  4 hyperlipidemia-continue Crestor  at present dose.  She was hesitant to increase dose previously.  5 hyperkalemia-patient will need follow-up with primary care.  Recheck potassium in 2 weeks.  Alexandria Angel, MD

## 2024-03-14 ENCOUNTER — Telehealth: Payer: Self-pay | Admitting: Home Health

## 2024-03-14 ENCOUNTER — Other Ambulatory Visit: Payer: Self-pay | Admitting: Home Health

## 2024-03-14 DIAGNOSIS — E875 Hyperkalemia: Secondary | ICD-10-CM

## 2024-03-14 LAB — COMPREHENSIVE METABOLIC PANEL WITH GFR
ALT: 22 IU/L (ref 0–32)
AST: 28 IU/L (ref 0–40)
Albumin: 4.6 g/dL (ref 3.9–4.9)
Alkaline Phosphatase: 93 IU/L (ref 44–121)
BUN/Creatinine Ratio: 26 (ref 12–28)
BUN: 17 mg/dL (ref 8–27)
Bilirubin Total: 0.3 mg/dL (ref 0.0–1.2)
CO2: 24 mmol/L (ref 20–29)
Calcium: 10 mg/dL (ref 8.7–10.3)
Chloride: 104 mmol/L (ref 96–106)
Creatinine, Ser: 0.66 mg/dL (ref 0.57–1.00)
Globulin, Total: 2 g/dL (ref 1.5–4.5)
Glucose: 89 mg/dL (ref 70–99)
Potassium: 5.8 mmol/L (ref 3.5–5.2)
Sodium: 142 mmol/L (ref 134–144)
Total Protein: 6.6 g/dL (ref 6.0–8.5)
eGFR: 99 mL/min/{1.73_m2} (ref >59)

## 2024-03-14 LAB — LIPID PANEL
Chol/HDL Ratio: 2 ratio (ref 0.0–4.4)
Cholesterol, Total: 124 mg/dL (ref 100–199)
HDL: 61 mg/dL (ref >39)
LDL Chol Calc (NIH): 50 mg/dL (ref 0–99)
Triglycerides: 62 mg/dL (ref 0–149)
VLDL Cholesterol Cal: 13 mg/dL (ref 5–40)

## 2024-03-14 NOTE — Telephone Encounter (Signed)
 Signed out by the overnight fellow, patient had lab results abnormal from yesterday with K 5.8. She had labs in Oct 2024 at [facility] showing borderline hyperkalemia, on repeat K was normal. She states she feels well without any unusual signs. She states she does not consume high K diet but eats normal amount of vegetables and have one protein drink daily. She is not on hyperkalemia prone medications. She is concerned about her renal function and states she drinks a lot of water. Will repeat BMP on Monday 4/28 and if K truly elevated, will treat. She did not really want to repeat labs initially and felt this is not "a big deal", explained to the patient the necessity of accurate lab value before treatment as well as the possible outcomes from hyperkalemia, she has agreed with repeating labs on Monday. Will forward this message to Dr Audery Blazing.

## 2024-03-16 ENCOUNTER — Encounter: Payer: Self-pay | Admitting: *Deleted

## 2024-03-16 ENCOUNTER — Other Ambulatory Visit: Payer: Self-pay | Admitting: *Deleted

## 2024-03-16 DIAGNOSIS — E875 Hyperkalemia: Secondary | ICD-10-CM

## 2024-03-17 ENCOUNTER — Ambulatory Visit: Attending: Cardiovascular Disease

## 2024-03-17 ENCOUNTER — Telehealth: Payer: Self-pay | Admitting: *Deleted

## 2024-03-17 ENCOUNTER — Telehealth: Payer: Self-pay | Admitting: Cardiology

## 2024-03-17 ENCOUNTER — Ambulatory Visit (INDEPENDENT_AMBULATORY_CARE_PROVIDER_SITE_OTHER): Admitting: Podiatry

## 2024-03-17 VITALS — BP 142/74 | HR 76 | Ht 64.0 in | Wt 179.6 lb

## 2024-03-17 DIAGNOSIS — M7750 Other enthesopathy of unspecified foot: Secondary | ICD-10-CM

## 2024-03-17 DIAGNOSIS — E875 Hyperkalemia: Secondary | ICD-10-CM

## 2024-03-17 DIAGNOSIS — R609 Edema, unspecified: Secondary | ICD-10-CM | POA: Diagnosis not present

## 2024-03-17 LAB — BASIC METABOLIC PANEL WITH GFR
BUN/Creatinine Ratio: 32 — ABNORMAL HIGH (ref 12–28)
BUN: 20 mg/dL (ref 8–27)
CO2: 22 mmol/L (ref 20–29)
Calcium: 10.5 mg/dL — ABNORMAL HIGH (ref 8.7–10.3)
Chloride: 106 mmol/L (ref 96–106)
Creatinine, Ser: 0.63 mg/dL (ref 0.57–1.00)
Glucose: 90 mg/dL (ref 70–99)
Potassium: 5.9 mmol/L (ref 3.5–5.2)
Sodium: 144 mmol/L (ref 134–144)
eGFR: 100 mL/min/{1.73_m2} (ref >59)

## 2024-03-17 MED ORDER — LOKELMA 10 G PO PACK: 10.0000 g | PACK | Freq: Two times a day (BID) | ORAL | 0 refills | Status: DC

## 2024-03-17 NOTE — Telephone Encounter (Signed)
 Follow Up:    Tanya Peterson calling with abnormal lab results

## 2024-03-17 NOTE — Telephone Encounter (Signed)
 Spoke with pt, Aware of dr Lourdes Roy recommendations.  Follow up scheduled for ECG Samples available for patient pick up. Order placed for labs

## 2024-03-17 NOTE — Telephone Encounter (Signed)
 Tanya Peterson with [employer] called critical lab results for this patient. Patient has a potassium of  5.9. This has been addressed already by Dr. Audery Blazing and his nurse.

## 2024-03-17 NOTE — Telephone Encounter (Signed)
-----   Message from Tanya Peterson sent at 03/17/2024  7:33 AM EDT ----- Pt needs to be seen in office for ECG today and will need lokelma 10 gms BID for 2 doses; repeat bmet in AM. Tanya Peterson

## 2024-03-17 NOTE — Patient Instructions (Signed)
 Medication Instructions:  Take Lokelma, 10 mg BID *If you need a refill on your cardiac medications before your next appointment, please call your pharmacy*  Lab Work: BMET tomorrow If you have labs (blood work) drawn today and your tests are completely normal, you will receive your results only by: MyChart Message (if you have MyChart) OR A paper copy in the mail If you have any lab test that is abnormal or we need to change your treatment, we will call you to review the results.  Testing/Procedures: NONE  Follow-Up: At [facility], you and your health needs are our priority.  As part of our continuing mission to provide you with exceptional heart care, our providers are all part of one team.  This team includes your primary Cardiologist (physician) and Advanced Practice Providers or APPs (Physician Assistants and Nurse Practitioners) who all work together to provide you with the care you need, when you need it.  Your next appointment:     Provider:     We recommend signing up for the patient portal called "MyChart".  Sign up information is provided on this After Visit Summary.  MyChart is used to connect with patients for Virtual Visits (Telemedicine).  Patients are able to view lab/test results, encounter notes, upcoming appointments, etc.  Non-urgent messages can be sent to your provider as well.   To learn more about what you can do with MyChart, go to [email or web address].   Other Instructions

## 2024-03-17 NOTE — Progress Notes (Signed)
   Nurse Visit   Date of Encounter: 03/17/2024 ID: Tanya Peterson, DOB [birth year], MRN [id number]  PCP:  Jacqulyne Maxim, MD   [facility] Providers Cardiologist: Alexandria Angel, MD Click to update primary MD,subspecialty MD or APP then REFRESH:1}     Visit Details   VS:  BP (!) 142/74 (BP Location: Left Arm, Patient Position: Sitting)   Pulse 76   Ht 5\' 4"  (1.626 m)   Wt 179 lb 9.6 oz (81.5 kg)   SpO2 96%   BMI 30.83 kg/m  , BMI Body mass index is 30.83 kg/m.  Wt Readings from Last 3 Encounters:  03/17/24 179 lb 9.6 oz (81.5 kg)  02/19/24 183 lb 3.2 oz (83.1 kg)  07/18/23 171 lb 6.4 oz (77.7 kg)     Reason for visit: EKG for hyperkalemia Performed today:   , Vitals, EKG, Provider consulted:DOD Alroy Aspen, MD), and Education Changes (medications, testing, etc.) : Per phone note pt given Lokelma 10g by mouth BID Length of Visit: 10 minutes    Medications Adjustments/Labs and Tests Ordered: Orders Placed This Encounter  Procedures   EKG 12-Lead   No orders of the defined types were placed in this encounter.  Pt came in for an EKG. DOD Alroy Aspen, MD) reviewed. Lokelma 10 g BID given per Dr. Audery Blazing  Signed, Isobel Marie, RN  03/17/2024 2:42 PM

## 2024-03-18 ENCOUNTER — Telehealth: Payer: Self-pay | Admitting: Cardiology

## 2024-03-18 ENCOUNTER — Encounter: Payer: Self-pay | Admitting: Podiatry

## 2024-03-18 NOTE — Telephone Encounter (Signed)
 Spoke with patient and she states she has taken both doses she states she has not had a bowel movement so she is not sure if it is working and if she still needs to repeat labs. She states she was told she will go to the bathroom instantly.  Advised if she did not have a BM tomorrow give us  a call and still repeat labs today. Will forward to provider

## 2024-03-18 NOTE — Progress Notes (Signed)
 Subjective: Chief Complaint  Patient presents with   Foot Pain    Pt presents for a follow up of left foot pain states she is doing better than last time but her ankle still swells.    63 year old female presents today for follow-up evaluation of bruising as well as ongoing ankle discomfort and swelling.  The bruising has resolved.  She said the pain fluctuates but she still gets swelling to her ankles.  She is concerned about her potassium and she is asking if she should see a kidney specialist.  Objective: AAO x3, NAD DP/PT pulses palpable bilaterally, CRT less than 3 seconds The bruising present in the feet have resolved.  He still gets ongoing discomfort along the peroneal tendons with this.  This appears to be intermittent.  She still gets swelling.  There is no area pinpoint tenderness otherwise.  There is no erythema or warmth. No pain with calf compression, swelling, warmth, erythema  Assessment: Bruising left foot with improvement; ankle swelling; ankle pain  Plan: -All treatment options discussed with the patient including all alternatives, risks, complications.  - We do long discussion today mostly focusing on her potassium as well as her other medical issues.  She does not follow with cardiology and her potassium is already been rechecked and treated.  Recommend to follow-up with her cardiologist.  I did order venous reflux study given ongoing swelling.  Continue with supportive shoe gear, elevation.  Return in about 2 months (around 05/17/2024).  Charity Conch DPM

## 2024-03-18 NOTE — Telephone Encounter (Signed)
 Pt c/o medication issue:  1. Name of Medication: Lokelma  2. How are you currently taking this medication (dosage and times per day)? As written  3. Are you having a reaction (difficulty breathing--STAT)? No   4. What is your medication issue? Pt said she has still not urinated while taking the medication

## 2024-03-19 ENCOUNTER — Other Ambulatory Visit: Payer: Self-pay

## 2024-03-19 DIAGNOSIS — E875 Hyperkalemia: Secondary | ICD-10-CM

## 2024-03-19 LAB — BASIC METABOLIC PANEL WITH GFR
BUN/Creatinine Ratio: 27 (ref 12–28)
BUN: 20 mg/dL (ref 8–27)
CO2: 22 mmol/L (ref 20–29)
Calcium: 10.8 mg/dL — ABNORMAL HIGH (ref 8.7–10.3)
Chloride: 103 mmol/L (ref 96–106)
Creatinine, Ser: 0.74 mg/dL (ref 0.57–1.00)
Glucose: 82 mg/dL (ref 70–99)
Potassium: 4.9 mmol/L (ref 3.5–5.2)
Sodium: 142 mmol/L (ref 134–144)
eGFR: 91 mL/min/{1.73_m2}

## 2024-03-20 ENCOUNTER — Encounter: Payer: Self-pay | Admitting: Podiatry

## 2024-03-23 ENCOUNTER — Encounter: Payer: Self-pay | Admitting: Cardiology

## 2024-03-23 ENCOUNTER — Ambulatory Visit (INDEPENDENT_AMBULATORY_CARE_PROVIDER_SITE_OTHER): Payer: Medicare HMO | Admitting: Cardiology

## 2024-03-23 VITALS — BP 111/73 | HR 76 | Ht 64.0 in | Wt 179.0 lb

## 2024-03-23 DIAGNOSIS — I773 Arterial fibromuscular dysplasia: Secondary | ICD-10-CM

## 2024-03-23 DIAGNOSIS — E875 Hyperkalemia: Secondary | ICD-10-CM | POA: Diagnosis not present

## 2024-03-23 DIAGNOSIS — R931 Abnormal findings on diagnostic imaging of heart and coronary circulation: Secondary | ICD-10-CM

## 2024-03-23 MED ORDER — ROSUVASTATIN CALCIUM 10 MG PO TABS: 10.0000 mg | ORAL_TABLET | Freq: Every day | ORAL | 3 refills | Status: AC

## 2024-03-23 NOTE — Patient Instructions (Signed)

## 2024-03-24 ENCOUNTER — Ambulatory Visit (INDEPENDENT_AMBULATORY_CARE_PROVIDER_SITE_OTHER): Admitting: Sports Medicine

## 2024-03-24 DIAGNOSIS — E66811 Obesity, class 1: Secondary | ICD-10-CM

## 2024-03-24 DIAGNOSIS — M25532 Pain in left wrist: Secondary | ICD-10-CM | POA: Diagnosis not present

## 2024-03-24 DIAGNOSIS — S91202A Unspecified open wound of left great toe with damage to nail, initial encounter: Secondary | ICD-10-CM | POA: Diagnosis not present

## 2024-03-24 MED ORDER — ZEPBOUND 2.5 MG/0.5ML ~~LOC~~ SOAJ: 2.5000 mg | SUBCUTANEOUS | 0 refills | Status: DC

## 2024-03-24 NOTE — Progress Notes (Signed)
    Procedures performed today:    None.  Independent interpretation of notes and tests performed by another provider:   None.  Brief History, Exam, Impression, and Recommendations:    Hypercalcemia Hypercalcemia noted on multiple occasions, we will recheck a BMP with ionized calcium , parathyroid hormone levels and magnesium. Patient has discontinued all supplements including vitamin D. She will hydrate aggressively. No symptoms of hypercalcemia.  Left wrist pain Ulnar-sided left wrist pain, we will do home conditioning for about 6 weeks for TFCC and if insufficient improvement we will get x-rays and an MR arthrogram.  Traumatic loss of toenail of left great toe Accidentally bumped great toe on an object, partial avulsion left toenail, it is a small percent of the nail itself with some skin avulsion, this will heal well with protection and keeping it clean.  Obesity (BMI 30.0-34.9) Use lose weight, this will help multiple pathologies. She will be enrolled in a multidisciplinary weight loss program with calorie counting, and exercise prescription, we will add Zepbound. She has no contraindications to a GLP-1 and she will not be on any additional weight loss medications. Return to see me after the first month on Zepbound. If unable to get Zepbound we will do compounded semaglutide.    ____________________________________________ Tanya Peterson. Sandy Crumb, M.D., ABFM., CAQSM., AME. Primary Care and Sports Medicine [facility]  Adjunct Professor of [facility] Medicine  [employer]  Restaurant manager, fast food

## 2024-03-24 NOTE — Assessment & Plan Note (Signed)
 Use lose weight, this will help multiple pathologies. She will be enrolled in a multidisciplinary weight loss program with calorie counting, and exercise prescription, we will add Zepbound. She has no contraindications to a GLP-1 and she will not be on any additional weight loss medications. Return to see me after the first month on Zepbound. If unable to get Zepbound we will do compounded semaglutide.

## 2024-03-24 NOTE — Assessment & Plan Note (Signed)
 Ulnar-sided left wrist pain, we will do home conditioning for about 6 weeks for TFCC and if insufficient improvement we will get x-rays and an MR arthrogram.

## 2024-03-24 NOTE — Assessment & Plan Note (Signed)
 Hypercalcemia noted on multiple occasions, we will recheck a BMP with ionized calcium , parathyroid hormone levels and magnesium. Patient has discontinued all supplements including vitamin D. She will hydrate aggressively. No symptoms of hypercalcemia.

## 2024-03-24 NOTE — Assessment & Plan Note (Signed)
 Accidentally bumped great toe on an object, partial avulsion left toenail, it is a small percent of the nail itself with some skin avulsion, this will heal well with protection and keeping it clean.

## 2024-03-26 ENCOUNTER — Telehealth: Payer: Self-pay

## 2024-03-26 DIAGNOSIS — E66811 Obesity, class 1: Secondary | ICD-10-CM

## 2024-03-26 NOTE — Telephone Encounter (Signed)
 Tanya Peterson, that is unfortunate, I am not aware of a true diagnosis of sleep apnea, I looked through her entire chart, both [facility] and care everywhere and I could not find an evidence of a sleep study.  Due to Medicare rules she will not be able to get any weight loss medication covered by insurance however compounded tirzepatide which is about $250 a month would be an option if she would like me to send it to the compounding pharmacy.

## 2024-03-26 NOTE — Telephone Encounter (Signed)
 Pharmacy Patient Advocate Encounter   Received notification from CoverMyMeds that prior authorization for Zepbound 2.5MG /0.5ML pen-injectors is required/requested.   Insurance verification completed.   The patient is insured through [employer] .   Per test claim: PA required; PA started via CoverMyMeds. KEY [id number] . Please see clinical question(s) below that I am not finding the answer to in her chart and advise.      Patient has Medicare Part D. Medicare does not cover medications for weight loss. They may cover for treatment of sleep apnea. Patient has sleep apnea on her problem list but I did not see an apnea hypopnea index. Please advise.

## 2024-03-27 MED ORDER — TIRZEPATIDE 10 MG/0.5ML ~~LOC~~ SOAJ: SUBCUTANEOUS | 11 refills | Status: AC

## 2024-03-27 NOTE — Telephone Encounter (Signed)
 Sent!

## 2024-03-27 NOTE — Addendum Note (Signed)
 Addended by: Gean Keels on: 03/27/2024 12:50 PM   Modules accepted: Orders

## 2024-03-27 NOTE — Telephone Encounter (Signed)
 Per patient, she is okay with provider sending in the compounded tirzepatide  rx to the compounding pharmacy in [facility]. She thanks the provider in advance for the alternative recommendation.

## 2024-04-02 ENCOUNTER — Ambulatory Visit: Payer: Self-pay | Admitting: Sports Medicine

## 2024-04-03 LAB — MAGNESIUM: Magnesium: 2.2 mg/dL (ref 1.6–2.3)

## 2024-04-03 LAB — BASIC METABOLIC PANEL WITH GFR
BUN/Creatinine Ratio: 27 (ref 12–28)
BUN: 17 mg/dL (ref 8–27)
CO2: 21 mmol/L (ref 20–29)
Calcium: 10 mg/dL (ref 8.7–10.3)
Chloride: 102 mmol/L (ref 96–106)
Creatinine, Ser: 0.62 mg/dL (ref 0.57–1.00)
Glucose: 97 mg/dL (ref 70–99)
Potassium: 4.5 mmol/L (ref 3.5–5.2)
Sodium: 140 mmol/L (ref 134–144)
eGFR: 101 mL/min/1.73 (ref >59)

## 2024-04-03 LAB — PTH, INTACT AND CALCIUM: PTH: 40 pg/mL (ref 15–65)

## 2024-04-03 LAB — CALCIUM, IONIZED: Calcium, Ion: 5.3 mg/dL (ref 4.5–5.6)

## 2024-04-07 ENCOUNTER — Encounter: Payer: Self-pay | Admitting: Cardiology

## 2024-04-08 ENCOUNTER — Ambulatory Visit: Payer: Self-pay | Admitting: Podiatry

## 2024-04-08 ENCOUNTER — Ambulatory Visit (HOSPITAL_COMMUNITY)
Admission: RE | Admit: 2024-04-08 | Discharge: 2024-04-08 | Disposition: A | Discharge status: Home / Self Care | Source: Ambulatory Visit | Attending: Podiatry | Admitting: Podiatry

## 2024-04-08 DIAGNOSIS — R609 Edema, unspecified: Secondary | ICD-10-CM

## 2024-04-10 ENCOUNTER — Other Ambulatory Visit: Payer: Self-pay | Admitting: Podiatry

## 2024-04-10 DIAGNOSIS — Z7689 Persons encountering health services in other specified circumstances: Secondary | ICD-10-CM

## 2024-04-10 NOTE — Telephone Encounter (Signed)
 I put in for a referral to [facility]. Can you follow up on this? Thanks!

## 2024-04-21 DIAGNOSIS — Z1231 Encounter for screening mammogram for malignant neoplasm of breast: Secondary | ICD-10-CM | POA: Diagnosis not present

## 2024-04-21 DIAGNOSIS — Z01419 Encounter for gynecological examination (general) (routine) without abnormal findings: Secondary | ICD-10-CM | POA: Diagnosis not present

## 2024-04-28 DIAGNOSIS — F4312 Post-traumatic stress disorder, chronic: Secondary | ICD-10-CM | POA: Diagnosis not present

## 2024-04-28 DIAGNOSIS — Z0289 Encounter for other administrative examinations: Secondary | ICD-10-CM

## 2024-04-29 ENCOUNTER — Encounter: Payer: Self-pay | Admitting: Bariatrics

## 2024-04-29 ENCOUNTER — Ambulatory Visit (INDEPENDENT_AMBULATORY_CARE_PROVIDER_SITE_OTHER): Admitting: Bariatrics

## 2024-04-29 DIAGNOSIS — E669 Obesity, unspecified: Secondary | ICD-10-CM | POA: Diagnosis not present

## 2024-04-29 DIAGNOSIS — M5136 Other intervertebral disc degeneration, lumbar region with discogenic back pain only: Secondary | ICD-10-CM

## 2024-04-29 DIAGNOSIS — E66811 Obesity, class 1: Secondary | ICD-10-CM

## 2024-04-29 DIAGNOSIS — Z6830 Body mass index (BMI) 30.0-30.9, adult: Secondary | ICD-10-CM

## 2024-04-29 NOTE — Progress Notes (Signed)
 . Office: [phone]  /  Fax: [phone]   Initial Visit  Tanya Peterson was seen in clinic today to evaluate for obesity. She is interested in losing weight to improve overall health and reduce the risk of weight related complications. She presents today to review program treatment options, initial physical assessment, and evaluation.     She was referred by: Specialist  When asked what else they would like to accomplish? She states: Adopt healthier eating patterns, Improve energy levels and physical activity, Improve existing medical conditions, Improve quality of life, Improve appearance, and Improve self-confidence  When asked how has your weight affected you? She states: Has affected self-esteem, Having fatigue, and Having poor endurance  Some associated conditions: Other: Vitamin D insuffiency  Contributing factors: menopause and slow metabolism for age  Weight promoting medications identified: None  Current nutrition plan: Low-carb, High-protein, Vegetarian, Portion control / smart choices, and Journaling  Current level of physical activity: Other: stationary bike and some home exercise.   Current or previous pharmacotherapy: Phentermine  and Other: Lipo Slim trying now.   Response to medication: Ineffective so it was discontinued   Past medical history includes:   Past Medical History:  Diagnosis Date   ADHD (attention deficit hyperactivity disorder)    Asthma    rarely uses inhaler   Bipolar affective disorder (HCC)    Complication of anesthesia    Depression    Fibromuscular dysplasia of cervicocranial artery (HCC)    History of kidney stones    Panic attacks    PONV (postoperative nausea and vomiting)    Sleep disorder    tx with nuvigal     Objective:   BP 107/72   Pulse 82   Temp 97.9 F (36.6 C)   Ht 5' 2.5 (1.588 m)   Wt 171 lb (77.6 kg)   SpO2 98%   BMI 30.78 kg/m  She was weighed on the bioimpedance scale: Body mass index is 30.78 kg/m.   Peak Weight:190 , Body Fat%:41.2 % , Visceral Fat Rating:11, Weight trend over the last 12 months: fluctuating.   General:  Alert, oriented and cooperative. Patient is in no acute distress.  Respiratory: Normal respiratory effort, no problems with respiration noted  Extremities: Normal range of motion.    Mental Status: Normal mood and affect. Normal behavior. Normal judgment and thought content.   DIAGNOSTIC DATA REVIEWED:  BMET    Component Value Date/Time   NA 140 04/01/2024 1508   K 4.5 04/01/2024 1508   CL 102 04/01/2024 1508   CO2 21 04/01/2024 1508   GLUCOSE 97 04/01/2024 1508   GLUCOSE 92 02/28/2017 2147   BUN 17 04/01/2024 1508   CREATININE 0.62 04/01/2024 1508   CALCIUM  10.0 04/01/2024 1508   GFRNONAA >60 02/28/2017 2147   GFRAA >60 02/28/2017 2147   No results found for: HGBA1C No results found for: INSULIN CBC    Component Value Date/Time   WBC 7.7 02/28/2017 2147   RBC 4.44 02/28/2017 2147   HGB 13.7 02/28/2017 2147   HCT 40.7 02/28/2017 2147   PLT 368 02/28/2017 2147   MCV 91.7 02/28/2017 2147   MCH 30.9 02/28/2017 2147   MCHC 33.7 02/28/2017 2147   RDW 12.6 02/28/2017 2147   Iron/TIBC/Ferritin/ %Sat No results found for: IRON, TIBC, FERRITIN, IRONPCTSAT Lipid Panel     Component Value Date/Time   CHOL 124 03/13/2024 0918   TRIG 62 03/13/2024 0918   HDL 61 03/13/2024 0918   CHOLHDL 2.0 03/13/2024 8657  LDLCALC 50 03/13/2024 0918   Hepatic Function Panel     Component Value Date/Time   PROT 6.6 03/13/2024 0918   ALBUMIN 4.6 03/13/2024 0918   AST 28 03/13/2024 0918   ALT 22 03/13/2024 0918   ALKPHOS 93 03/13/2024 0918   BILITOT 0.3 03/13/2024 0918   BILIDIR 0.13 06/26/2023 0951      Component Value Date/Time   TSH 4.451 08/29/2015 0315     Assessment and Plan:   History of hypercalcemia:   She has a history of hypercalcemia which has been noted on multiple occasions.  She is seeing Dr. Sandy Crumb [facility].  She had  been on several supplements but had stopped these rather recently.  She had recent lab work that included a BMP with ionized calcium , parathyroid hormone, and magnesium levels.  She denies frank symptoms of hypercalcemia.  She is also concerned about recently elevated potassium levels and she has been trying to minimize potassium in her diet.   Plan:  She will follow-up with Dr. Sandy Crumb.  We will begin a plan for weight loss and will incorporate some principles which will hopefully ameliorate some of her issues with hypercalcium and hypokalemia if related/influenced by her diet to her diet.  We will check another vitamin D level.    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  She states that she would love to be able to walk more but also has some difficulty with this due to different pain issues.   Plan:   She will follow-up with her Dr. Sandy Crumb.  She will start to work on weight loss and incorporate some exercise as tolerated.  Would like for her to do some gentle such as appropriate Hatha Yoga and perhaps some Digna Fraise.   Generalized Obesity: Current BMI 30.78    Obesity Treatment / Action Plan:  Will work on eliminating or reducing the presence of highly palatable, calorie dense foods in the home. Will be scheduled for indirect calorimetry to determine resting energy expenditure in a fasting state.  This will allow us  to create a reduced calorie, high-protein meal plan to promote loss of fat mass while preserving muscle mass. Will work on reading labels, making healthier choices and watching portion sizes. Counseled on the health benefits of losing 5%-15% of total body weight. Was counseled on nutritional approaches to weight loss and benefits of reducing processed foods and consuming plant-based foods and high quality protein as part of nutritional  weight management. Was counseled on pharmacotherapy and role as an adjunct in weight management.   Obesity Education Performed Today:  She was weighed on the bioimpedance scale and results were mentioned and documented in the synopsis.  We discussed obesity as a disease and the importance of a more detailed evaluation of all the factors contributing to the disease.  We discussed the importance of long term lifestyle changes which include nutrition, exercise and behavioral modifications as well as the importance of customizing this to her specific health and social needs.  We discussed the benefits of reaching a healthier weight to alleviate the symptoms of existing conditions and reduce the risks of the biomechanical, metabolic and psychological effects of obesity.  Discussed New Patient/Late Arrival, and Cancellation Policies. Patient voiced understanding and allowed to ask questions.   Pretty Weltman appears to be in the action stage of change and states  they are ready to start intensive lifestyle modifications and behavioral modifications.  Time spent on visit in care of the patient today including the items listed below was 60 minutes.    20 minutes were spent talking about the history, 20 minutes for face to face counseling discussing our initial visit, indirect calorimetry testing and handout given.   I spent face to face time discussing our policies and procedures.  I reviewed her indirect calorimetry. I discussed the implications for the diet plan.  That was given on indirect calorimetry.   Discussed the bio-impedence test (fat %, muscle mass, and water weight) and allowed the patient to ask questions.   We discussed her previous lab testing that was done by her primary.   I additionally spent time documenting, reviewing, and checking the codes before submitting.    60 minutes was spent today on this visit including the above counseling, pre-visit chart review, and post-visit  documentation as above. .  Reviewed by clinician on day of visit: allergies, medications, problem list, medical history, surgical history, family history, social history, and previous encounter notes.    Tyeshia Cornforth A. Claiborne CrewO.

## 2024-05-05 ENCOUNTER — Ambulatory Visit (INDEPENDENT_AMBULATORY_CARE_PROVIDER_SITE_OTHER): Admitting: Sports Medicine

## 2024-05-05 VITALS — BP 135/72 | HR 90 | Wt 170.0 lb

## 2024-05-05 DIAGNOSIS — E66811 Obesity, class 1: Secondary | ICD-10-CM

## 2024-05-05 NOTE — Progress Notes (Signed)
    Procedures performed today:    None.  Independent interpretation of notes and tests performed by another provider:   None.  Brief History, Exam, Impression, and Recommendations:    Obesity (BMI 30.0-34.9) 10 pound weight loss after the first 3 weeks on compounded tirzepatide . Continue for now, return in 3 months.    ____________________________________________ Joselyn Nicely. Sandy Crumb, M.D., ABFM., CAQSM., AME. Primary Care and Sports Medicine [facility]  Adjunct Professor of [facility] Medicine  [employer]  Restaurant manager, fast food

## 2024-05-05 NOTE — Assessment & Plan Note (Signed)
 10 pound weight loss after the first 3 weeks on compounded tirzepatide . Continue for now, return in 3 months.

## 2024-05-18 ENCOUNTER — Ambulatory Visit (INDEPENDENT_AMBULATORY_CARE_PROVIDER_SITE_OTHER): Admitting: Podiatry

## 2024-05-18 ENCOUNTER — Ambulatory Visit: Admitting: Podiatry

## 2024-05-18 ENCOUNTER — Encounter: Admitting: Bariatrics

## 2024-05-18 ENCOUNTER — Encounter: Payer: Self-pay | Admitting: Bariatrics

## 2024-05-18 ENCOUNTER — Ambulatory Visit (INDEPENDENT_AMBULATORY_CARE_PROVIDER_SITE_OTHER): Admitting: Bariatrics

## 2024-05-18 VITALS — BP 100/68 | HR 83 | Temp 97.4°F | Ht 62.5 in | Wt 167.0 lb

## 2024-05-18 DIAGNOSIS — M7750 Other enthesopathy of unspecified foot: Secondary | ICD-10-CM | POA: Diagnosis not present

## 2024-05-18 DIAGNOSIS — R7309 Other abnormal glucose: Secondary | ICD-10-CM | POA: Diagnosis not present

## 2024-05-18 DIAGNOSIS — E669 Obesity, unspecified: Secondary | ICD-10-CM | POA: Diagnosis not present

## 2024-05-18 DIAGNOSIS — Z6830 Body mass index (BMI) 30.0-30.9, adult: Secondary | ICD-10-CM | POA: Diagnosis not present

## 2024-05-18 DIAGNOSIS — R5383 Other fatigue: Secondary | ICD-10-CM | POA: Diagnosis not present

## 2024-05-18 DIAGNOSIS — Z0000 Encounter for general adult medical examination without abnormal findings: Secondary | ICD-10-CM

## 2024-05-18 DIAGNOSIS — E538 Deficiency of other specified B group vitamins: Secondary | ICD-10-CM | POA: Diagnosis not present

## 2024-05-18 DIAGNOSIS — Z1331 Encounter for screening for depression: Secondary | ICD-10-CM

## 2024-05-18 DIAGNOSIS — R0602 Shortness of breath: Secondary | ICD-10-CM | POA: Diagnosis not present

## 2024-05-18 DIAGNOSIS — E559 Vitamin D deficiency, unspecified: Secondary | ICD-10-CM | POA: Diagnosis not present

## 2024-05-18 DIAGNOSIS — L600 Ingrowing nail: Secondary | ICD-10-CM | POA: Diagnosis not present

## 2024-05-18 NOTE — Patient Instructions (Addendum)

## 2024-05-18 NOTE — Progress Notes (Unsigned)
 Subjective: Chief Complaint  Patient presents with   Foot Swelling    RM#11 Follow up today for ankle swelling states is better but still continues to have swelling.     63 year old female presents today for follow-up evaluation of bruising as well as ongoing ankle discomfort and swelling.  This is resolved.  She states she was doing well and she thought her ankles were improving however she denied any popping for couple months but she was moving her ankles in a nonweightbearing position and she felt heard a to her right ankle.  This is not related to any injury.  Has not done this in the last several months.  She also states that part of her right toenail came off.  She denies any drainage.  Objective: AAO x3, NAD DP/PT pulses palpable bilaterally, CRT less than 3 seconds The bruising present in the feet have resolved.  There is still some tenderness most along the course of the peroneal send on the right side.  She has tenderness on the sinus tarsi bilaterally.  No area pinpoint tenderness.  Ankle, subtalar joint range of motion intact bilaterally.  Incurvation present right hallux toenail without any edema, erythema, drainage or pus or any signs of infection. No pain with calf compression, swelling, warmth, erythema  Assessment: Bruising left foot with improvement; ankle swelling; ankle pain  Plan: -All treatment options discussed with the patient including all alternatives, risks, complications.  -If the popping sensation continues recommend advanced imaging to further evaluate the area.  For now she is to continue with home rehab exercises but she is also starting physical therapy for other issues to her knee and will work the foot as well. -Monitor right hallux toenail for any signs or symptoms of infection.  Return in about 2 months (around 07/18/2024).  Donnice JONELLE Fees DPM

## 2024-05-18 NOTE — Progress Notes (Signed)
 At a Glance:  Vitals Temp: (!) 97.4 F (36.3 C) BP: 100/68 Pulse Rate: 83 SpO2: 98 %   Anthropometric Measurements Height: 5' 2.5 (1.588 m) Weight: 167 lb (75.8 kg) BMI (Calculated): 30.04 Starting Weight: 167lb Peak Weight: 190lb   Body Composition  Body Fat %: 41.2 % Fat Mass (lbs): 69.2 lbs Muscle Mass (lbs): 93.6 lbs Total Body Water (lbs): 66.4 lbs Visceral Fat Rating : 11   Other Clinical Data RMR: 1325 Fasting: yes Labs: yes Today's Visit #: 1 Starting Date: 05/18/24     Indirect Calorimeter:   Resting Metabolic Rate ( RMR):  RMR (actual): 1325 kcal RMR (calculated): 1365 kcal The calculated basal metabolic rate is 8634 kcal thus her basal metabolic rate is about the same as expected.  Plan:   Indirect calorimeter completed, interpreted and reviewed with patient today and allowed to ask questions.  Discussed the implications for the chosen plan and exercise based on the RMR reading.  Will consider repeating the RMR in the future based on weight loss.    Chief Complaint:  Obesity   Subjective:  Tanya Peterson (MR# [id number]) is a 63 y.o. female who presents for evaluation and treatment of obesity and related comorbidities.   Tanya Peterson is currently in the action stage of change and ready to dedicate time achieving and maintaining a healthier weight. Tanya Peterson is interested in becoming our patient and working on intensive lifestyle modifications including (but not limited to) diet and exercise for weight loss.  Tanya Peterson has been struggling with her weight. She has been unsuccessful in either losing weight, maintaining weight loss, or reaching her healthy weight goal.  Tanya Peterson's habits were reviewed today and are as follows: Her family eats meals together, she thinks her family will eat healthier with her, and she started gaining weight in her twenties, and then slowly started gaining weight. .  Current or previous pharmacotherapy: Phentermine  and Other: Lipo Slim    Response to medication: Ineffective so it was discontinued  Other Fatigue [name] admits to daytime somnolence and admits to waking up still tired. Patient has a history of symptoms of daytime fatigue. Tanya Peterson generally gets 4 or 5 hours of sleep per night, and states that she has difficulty falling back asleep if awakened. Snoring is present. Apneic episodes are not present. Epworth Sleepiness Score is 13.   Shortness of Breath Tanya Peterson notes increasing shortness of breath with exercising and seems to be worsening over time with weight gain. She notes getting out of breath sooner with activity than she used to. This has not gotten worse recently. Iliyana denies shortness of breath at rest or orthopnea.  Depression Screen Tanya Peterson's Food and Mood (modified PHQ-9) score was 19. 15-19 moderate severe depression      No data to display           Assessment and Plan:   Other Fatigue Tanya Peterson does feel that her weight is causing her energy to be lower than it should be. Fatigue may be related to obesity, depression or many other causes. Labs will be ordered, and in the meanwhile, Tanya Peterson will focus on self care including making healthy food choices, increasing physical activity and focusing on stress reduction.  Shortness of Breath Tanya Peterson does feel that she gets out of breath more easily that she used to when she exercises. Tanya Peterson's shortness of breath appears to be obesity related and exercise induced. She has agreed to work on weight loss and gradually increase exercise to treat her exercise induced shortness  of breath. Will continue to monitor closely.  Health Maintenance:   Obesity   Plan: Will do indirect calorimetry, and labs.     Vitamin D Deficiency:   Vitamin D is not at goal of 50.  Most recent vitamin D level was 39 at her last visit. . She is at risk for vitamin D deficiency due to obesity.  She is not on vitamin D   Plan: Will check for vitamin D deficiency.    B 12 deficiency:    She was taking B12 in the past but recently stopped all supplements.  She is at risk for B12 deficiency.  Plan:  Check B 12 lab today.   Hypercalcemia:   Her calcium  was up to 10.8 in the past, but was down to 10.0 at her last labs. She stopped all of her of her herbal supplements. She started back on magnesium and zinc, but not her calcium  at this time.  Plan: Will check her CMP.   Elevated glucose:   She is at risk for elevated glucose secondary to obesity.  She states that her last labs showed that her glucose is rising from her previous levels.  Plan: Will do a hemoglobin A1c and insulin level today.  [place] had a positive depression screening. Depression is commonly associated with obesity and often results in emotional eating behaviors. We will monitor this closely and work on CBT to help improve the non-hunger eating patterns. Referral to Psychology may be required if no improvement is seen as she continues in our clinic.    Previous labs reviewed today. Date: 04/01/24 BMP, glucose  Labs done today CMP, Lipids, Insulin, HgbA1c, Vit D, Vit B12, and Thyroid  Panel   Generalized Obesity: BMI (Calculated): 30.04   Tanya Peterson is currently in the action stage of change and her goal is to begin weight loss efforts. I recommend [name] begin the structured treatment plan as follows:  She has agreed to Category 1 Plan, Will start journaling and will eat 1,000 to 1,100 calories and 70 to 80 calories.   Exercise goals: For substantial health benefits, adults should do at least 150 minutes (2 hours and 30 minutes) a week of moderate-intensity, or 75 minutes (1 hour and 15 minutes) a week of vigorous-intensity aerobic physical activity, or an equivalent combination of moderate- and vigorous-intensity aerobic activity. Aerobic activity should be performed in episodes of at least 10 minutes, and preferably, it should be spread throughout the week.  Behavioral modification strategies:increasing  lean protein intake, decreasing simple carbohydrates, increase H2O intake, increase high fiber foods, no skipping meals, meal planning and cooking strategies, keeping healthy foods in the home, better snacking choices, avoiding temptations, and planning for success  She was informed of the importance of frequent follow-up visits to maximize her success with intensive lifestyle modifications for her multiple health conditions. She was informed we would discuss her lab results at her next visit unless there is a critical issue that needs to be addressed sooner. Jeweldean agreed to keep her next visit at the agreed upon time to discuss these results.  Objective:  General: Cooperative, alert, well developed, in no acute distress. HEENT: Conjunctivae and lids unremarkable. Cardiovascular: Regular rhythm.  Lungs: Normal work of breathing. Neurologic: No focal deficits.   Lab Results  Component Value Date   CREATININE 0.62 04/01/2024   BUN 17 04/01/2024   NA 140 04/01/2024   K 4.5 04/01/2024   CL 102 04/01/2024   CO2 21 04/01/2024   Lab Results  Component Value Date   ALT 22 03/13/2024   AST 28 03/13/2024   ALKPHOS 93 03/13/2024   BILITOT 0.3 03/13/2024   No results found for: HGBA1C No results found for: INSULIN Lab Results  Component Value Date   TSH 4.451 08/29/2015   Lab Results  Component Value Date   CHOL 124 03/13/2024   HDL 61 03/13/2024   LDLCALC 50 03/13/2024   TRIG 62 03/13/2024   CHOLHDL 2.0 03/13/2024   Lab Results  Component Value Date   WBC 7.7 02/28/2017   HGB 13.7 02/28/2017   HCT 40.7 02/28/2017   MCV 91.7 02/28/2017   PLT 368 02/28/2017   No results found for: IRON, TIBC, FERRITIN  Attestation Statements:  Applicable history such as the following:  allergies, medications, problem list, medical history, surgical history, family history, social history, and previous encounter notes reviewed by clinician on day of visit:  Time spent on visit in  care of the patient today including the items listed below was 58 minutes.    20 minutes were spent talking about the history, 30 minutes for face to face counseling implementing the plan, discussing the specifics of how to arrange meals, meal planning, water intake.   I spent face to face time discussing his/her plan, including breakfast, additional breakfast options, lunch, and dinner options, grocery list, and snacks.  I reviewed her indirect calorimetry. I discussed the implications for the diet plan.    Discussed the bio-impedence test (fat %, muscle mass, and water weight) and allowed the patient to ask questions.   Discussed the following information sheets: Category 1 information sheet, grocery list, smart fruit sheet, healthy carbohydrates sheet, foods that can contain gluten.  We discussed foods that contain gluten. We may doing elimination diet in the future.  We will discuss this further in the future and if she has true signs that she may have gluten sensitivity or intolerance will send her back to her PCP for testing such as celiac testing and a small bowel overgrowth work-up.    I reviewed the labs which were ordered from her visit on 04/01/2024 and 03/18/2024.  I additionally spent time documenting, reviewing, and checking the codes before submitting.   This may have been prepared with the assistance of Engineer, civil (consulting).  Occasional wrong-word or sound-a-like substitutions may have occurred due to the inherent limitations of voice recognition software.    Clayborne Daring, DO

## 2024-05-20 LAB — LIPID PANEL WITH LDL/HDL RATIO
Cholesterol, Total: 124 mg/dL (ref 100–199)
HDL: 58 mg/dL (ref >39)
LDL Chol Calc (NIH): 44 mg/dL (ref 0–99)
LDL/HDL Ratio: 0.8 ratio (ref 0.0–3.2)
Triglycerides: 125 mg/dL (ref 0–149)
VLDL Cholesterol Cal: 22 mg/dL (ref 5–40)

## 2024-05-20 LAB — COMPREHENSIVE METABOLIC PANEL WITH GFR
ALT: 18 IU/L (ref 0–32)
AST: 22 IU/L (ref 0–40)
Albumin: 4.9 g/dL (ref 3.9–4.9)
Alkaline Phosphatase: 106 IU/L (ref 44–121)
BUN/Creatinine Ratio: 17 (ref 12–28)
BUN: 11 mg/dL (ref 8–27)
Bilirubin Total: 0.6 mg/dL (ref 0.0–1.2)
CO2: 22 mmol/L (ref 20–29)
Calcium: 10.1 mg/dL (ref 8.7–10.3)
Chloride: 103 mmol/L (ref 96–106)
Creatinine, Ser: 0.64 mg/dL (ref 0.57–1.00)
Globulin, Total: 2.6 g/dL (ref 1.5–4.5)
Glucose: 84 mg/dL (ref 70–99)
Potassium: 4.7 mmol/L (ref 3.5–5.2)
Sodium: 142 mmol/L (ref 134–144)
Total Protein: 7.5 g/dL (ref 6.0–8.5)
eGFR: 100 mL/min/{1.73_m2} (ref >59)

## 2024-05-20 LAB — INSULIN, RANDOM: INSULIN: 7.8 u[IU]/mL (ref 2.6–24.9)

## 2024-05-20 LAB — VITAMIN D 25 HYDROXY (VIT D DEFICIENCY, FRACTURES): Vit D, 25-Hydroxy: 53.3 ng/mL (ref 30.0–100.0)

## 2024-05-20 LAB — VITAMIN B12: Vitamin B-12: 732 pg/mL (ref 232–1245)

## 2024-05-20 LAB — HEMOGLOBIN A1C
Est. average glucose Bld gHb Est-mCnc: 103 mg/dL
Hgb A1c MFr Bld: 5.2 % (ref 4.8–5.6)

## 2024-05-20 LAB — TSH+T4F+T3FREE
Free T4: 1.03 ng/dL (ref 0.82–1.77)
T3, Free: 3.1 pg/mL (ref 2.0–4.4)
TSH: 1.76 u[IU]/mL (ref 0.450–4.500)

## 2024-05-26 ENCOUNTER — Ambulatory Visit (INDEPENDENT_AMBULATORY_CARE_PROVIDER_SITE_OTHER): Admitting: Bariatrics

## 2024-05-26 DIAGNOSIS — F4312 Post-traumatic stress disorder, chronic: Secondary | ICD-10-CM | POA: Diagnosis not present

## 2024-06-09 ENCOUNTER — Encounter: Payer: Self-pay | Admitting: Bariatrics

## 2024-06-09 ENCOUNTER — Ambulatory Visit (INDEPENDENT_AMBULATORY_CARE_PROVIDER_SITE_OTHER): Admitting: Bariatrics

## 2024-06-09 VITALS — BP 108/71 | HR 78 | Temp 98.1°F | Ht 62.5 in | Wt 163.0 lb

## 2024-06-09 DIAGNOSIS — E669 Obesity, unspecified: Secondary | ICD-10-CM

## 2024-06-09 DIAGNOSIS — E559 Vitamin D deficiency, unspecified: Secondary | ICD-10-CM | POA: Diagnosis not present

## 2024-06-09 DIAGNOSIS — F4312 Post-traumatic stress disorder, chronic: Secondary | ICD-10-CM | POA: Diagnosis not present

## 2024-06-09 DIAGNOSIS — Z6829 Body mass index (BMI) 29.0-29.9, adult: Secondary | ICD-10-CM

## 2024-06-09 NOTE — Progress Notes (Signed)
 First follow-up after initial visit.        WEIGHT SUMMARY AND BIOMETRICS  Weight Lost Since Last Visit: 4lb  Weight Gained Since Last Visit: 0   Vitals Temp: 98.1 F (36.7 C) BP: 108/71 Pulse Rate: 78 SpO2: 98 %   Anthropometric Measurements Height: 5' 2.5 (1.588 m) Weight: 163 lb (73.9 kg) BMI (Calculated): 29.32 Weight at Last Visit: 167lb Weight Lost Since Last Visit: 4lb Weight Gained Since Last Visit: 0 Starting Weight: 167lb Total Weight Loss (lbs): 4 lb (1.814 kg) Peak Weight: 190lb   Body Composition  Body Fat %: 39.9 % Fat Mass (lbs): 65.2 lbs Muscle Mass (lbs): 93 lbs Total Body Water (lbs): 67.2 lbs Visceral Fat Rating : 10   Other Clinical Data Fasting: no Labs: no Today's Visit #: 2 Starting Date: 05/18/24    OBESITY Tanya Peterson is here to discuss her progress with her obesity treatment plan along with follow-up of her obesity related diagnoses.    Nutrition Plan: the Category 1 plan and keeping a food journal with goal of 1000-1100 calories and 70-80 grams of protein daily - 95% adherence.  Current exercise: Walking Interim History:  She is down 4 lbs since her last visit.  Eating all of the food on the plan., Protein intake is as prescribed, Is skipping meals, Journaling consistently., and Water intake is adequate.  Initial positives regarding the dietary plan:  Initial challenges regarding  the dietary plan:   Hunger is moderately controlled.  Cravings are well controlled.  Assessment/Plan:   Vitamin D  Deficiency Vitamin D  is at goal of 50.  Most recent vitamin D  level was 53.3. She is on vitamin D  OTC.  Lab Results  Component Value Date   VD25OH 53.3 05/18/2024    Plan: She will continue vitamin D  OTC.  Will recheck in approximately 3 months.   Hypercalcemia:   History of previous elevated calcium  on  03/18/2024 at 10.8.  Repeat levels of calcium  on 05 14/25 and her most recent value on 05/18/2024 were normal.   Plan: No action needed at this time.  Will recheck her labs in approximately 3 months.    Generalized Obesity: Current BMI BMI (Calculated): 29.32   Tanya Peterson is currently in the action stage of change. As such, her goal is to continue with weight loss efforts.  She has agreed to the Category 1 plan and keeping a food journal with goal of 1,000 to 1,100  calories and 70 to 80 grams of protein daily.  Exercise goals: All adults should avoid inactivity. Some physical activity is better than none, and adults who participate in any amount of physical activity gain some health benefits. She has been doing some walking, and some arm exercises. She is the pool on some days.  Behavioral modification strategies: increasing lean protein intake, no meal skipping, meal planning , increase water intake, better snacking choices, planning for success, increasing vegetables, avoiding temptations, keep healthy foods in the home, weigh protein portions, work on smaller portions, and mindful eating. She will continue to count her calories and her protein on a regular basis.  She will eat her protein first.  Information sheets given on high-protein vegetables and a travel sheet given.  We discussed healthy fats.  She will continue to keep her water intake high around 70 to 90 cal depending on her activity level. Information sheet given for breathing technique for 4-7-8 for relaxation and sleeping.   Tanya Peterson has agreed to follow-up with our clinic in 2 weeks.  Labs reviewed today from last visit (CMP, Lipids, HgbA1c, insulin , vitamin D , B 12, and thyroid  panel).   Objective:   VITALS: Per patient if applicable, see vitals. GENERAL: Alert and in no acute distress. CARDIOPULMONARY: No increased WOB. Speaking in clear sentences.  PSYCH: Pleasant and cooperative. Speech normal rate and rhythm. Affect is  appropriate. Insight and judgement are appropriate. Attention is focused, linear, and appropriate.  NEURO: Oriented as arrived to appointment on time with no prompting.   Attestation Statements:   This was prepared with the assistance of Engineer, civil (consulting).  Occasional wrong-word or sound-a-like substitutions may have occurred due to the inherent limitations of voice recognition software.   Clayborne Daring, DO'

## 2024-06-23 ENCOUNTER — Ambulatory Visit: Admitting: Bariatrics

## 2024-06-23 ENCOUNTER — Encounter: Payer: Self-pay | Admitting: Bariatrics

## 2024-06-23 VITALS — BP 137/78 | HR 76 | Temp 97.9°F | Ht 62.5 in | Wt 158.0 lb

## 2024-06-23 DIAGNOSIS — E785 Hyperlipidemia, unspecified: Secondary | ICD-10-CM

## 2024-06-23 DIAGNOSIS — Z6828 Body mass index (BMI) 28.0-28.9, adult: Secondary | ICD-10-CM | POA: Diagnosis not present

## 2024-06-23 DIAGNOSIS — F4312 Post-traumatic stress disorder, chronic: Secondary | ICD-10-CM | POA: Diagnosis not present

## 2024-06-23 DIAGNOSIS — E669 Obesity, unspecified: Secondary | ICD-10-CM | POA: Diagnosis not present

## 2024-06-23 NOTE — Progress Notes (Signed)
 WEIGHT SUMMARY AND BIOMETRICS  Weight Lost Since Last Visit: 5lb  Weight Gained Since Last Visit: 0   Vitals Temp: 97.9 F (36.6 C) BP: 137/78 Pulse Rate: 76 SpO2: 99 %   Anthropometric Measurements Height: 5' 2.5 (1.588 m) Weight: 158 lb (71.7 kg) BMI (Calculated): 28.42 Weight at Last Visit: 163lb Weight Lost Since Last Visit: 5lb Weight Gained Since Last Visit: 0 Starting Weight: 167lb Total Weight Loss (lbs): 9 lb (4.082 kg) Peak Weight: 190lb   Body Composition  Body Fat %: 39.1 % Fat Mass (lbs): 61.8 lbs Muscle Mass (lbs): 91.4 lbs Total Body Water (lbs): 63 lbs Visceral Fat Rating : 10   Other Clinical Data Fasting: no Labs: no Today's Visit #: 3 Starting Date: 05/18/24    OBESITY Tanya Peterson is here to discuss her progress with her obesity treatment plan along with follow-up of her obesity related diagnoses.    Nutrition Plan: the Category 1 plan and keeping a food journal with goal of [id number] calories and 70-80 grams of protein daily - 85-90% adherence.  Current exercise: none  Interim History:  She is down 5 lbs since her initial visit. She is having trouble focusing on both exercise and food.  Eating all of the food on the plan., Protein intake is as prescribed, Is not skipping meals, and Water intake is adequate.   Pharmacotherapy: Tanya Peterson is on LipoSlim 75 units (compounding medication) Tanya Peterson.  Adverse side effects: None Hunger is moderately controlled.  Cravings are moderately controlled.  Assessment/Plan:   Hyperlipidemia LDL is at goal. Medication(s): Crestor   Cardiovascular risk factors: dyslipidemia and sedentary lifestyle  Lab Results  Component Value Date   CHOL 124 05/18/2024   HDL 58 05/18/2024   LDLCALC 44 05/18/2024   TRIG 125 05/18/2024   CHOLHDL 2.0 03/13/2024   Lab Results  Component Value Date   ALT 18  05/18/2024   AST 22 05/18/2024   ALKPHOS 106 05/18/2024   BILITOT 0.6 05/18/2024   The ASCVD Risk score (Arnett DK, et al., 2019) failed to calculate for the following reasons:   The valid total cholesterol range is 130 to 320 mg/dL  Plan:  Continue statin.  Will avoid all trans fats.  Will read labels Will minimize saturated fats except the following: low fat meats in moderation, diary, and limited dark chocolate.  Will consider deep water aerobics.    Generalized Obesity: Current BMI BMI (Calculated): 28.42   Pharmacotherapy Plan Continue  Compounding Tirzepatide  per another provider.   Tanya Peterson is currently in the action stage of change. As such, her goal is to continue with weight loss efforts.  She has agreed to keeping a food journal with goal of 1,000 to 1,100 calories and 70 to 80 grams of protein daily.  Exercise goals: All adults should avoid inactivity. Some physical activity is better than none, and adults who participate  in any amount of physical activity gain some health benefits.  Behavioral modification strategies: increasing lean protein intake, decreasing simple carbohydrates , no meal skipping, decrease eating out, meal planning , increase water intake, better snacking choices, increasing vegetables, avoiding temptations, keep healthy foods in the home, keep a strict food journal, weigh protein portions, measure portion sizes, and mindful eating.  Tanya Peterson has agreed to follow-up with our clinic in 3 weeks.     Objective:   VITALS: Per patient if applicable, see vitals. GENERAL: Alert and in no acute distress. CARDIOPULMONARY: No increased WOB. Speaking in clear sentences.  PSYCH: Pleasant and cooperative. Speech normal rate and rhythm. Affect is appropriate. Insight and judgement are appropriate. Attention is focused, linear, and appropriate.  NEURO: Oriented as arrived to appointment on time with no prompting.   Attestation Statements:   Time spent on visit  in care of the patient today including the items listed below was 37 minutes.    10 minutes were spent talking about the history, 20 minutes for face to face counseling implementing the plan, discussing the specifics of how to arrange meals, meal planning, water intake.   I spent face to face time discussing her plan, including meals and snack.   We discussed her journaling behaviors and routine.   Discussed the bio-impedence test (fat %, muscle mass, and water weight).   We discussed exercise options including water aerobics, swimming, and walking.    I additionally spent time documenting, reviewing, and checking the codes before submitting.   This was prepared with the assistance of Engineer, civil (consulting).  Occasional wrong-word or sound-a-like substitutions may have occurred due to the inherent limitations of voice recognition   Clayborne Daring, DO

## 2024-07-08 ENCOUNTER — Ambulatory Visit: Admitting: Bariatrics

## 2024-07-09 ENCOUNTER — Encounter: Payer: Self-pay | Admitting: Bariatrics

## 2024-07-09 ENCOUNTER — Ambulatory Visit: Admitting: Bariatrics

## 2024-07-09 VITALS — BP 109/72 | HR 81 | Temp 97.7°F | Ht 62.5 in | Wt 157.0 lb

## 2024-07-09 DIAGNOSIS — E559 Vitamin D deficiency, unspecified: Secondary | ICD-10-CM

## 2024-07-09 DIAGNOSIS — E785 Hyperlipidemia, unspecified: Secondary | ICD-10-CM | POA: Diagnosis not present

## 2024-07-09 DIAGNOSIS — E669 Obesity, unspecified: Secondary | ICD-10-CM

## 2024-07-09 DIAGNOSIS — Z6828 Body mass index (BMI) 28.0-28.9, adult: Secondary | ICD-10-CM | POA: Diagnosis not present

## 2024-07-09 NOTE — Progress Notes (Signed)
 WEIGHT SUMMARY AND BIOMETRICS  Weight Lost Since Last Visit: 1lb  Weight Gained Since Last Visit: 0   Vitals Temp: 97.7 F (36.5 C) BP: 109/72 Pulse Rate: 81 SpO2: 98 %   Anthropometric Measurements Height: 5' 2.5 (1.588 m) Weight: 157 lb (71.2 kg) BMI (Calculated): 28.24 Weight at Last Visit: 158lb Weight Lost Since Last Visit: 1lb Weight Gained Since Last Visit: 0 Starting Weight: 167lb Total Weight Loss (lbs): 10 lb (4.536 kg) Peak Weight: 190lb   Body Composition  Body Fat %: 38.9 % Fat Mass (lbs): 61.2 lbs Muscle Mass (lbs): 91.4 lbs Total Body Water (lbs): 64.4 lbs Visceral Fat Rating : 10   Other Clinical Data Fasting: no Labs: no Today's Visit #: 4 Starting Date: 05/18/24    OBESITY Tanya Peterson is here to discuss her progress with her obesity treatment plan along with follow-up of her obesity related diagnoses.    Nutrition Plan: the Category 1 plan and keeping a food journal with goal of 1,000-1,100 calories and 70-80 grams of protein daily - 75-80% adherence.  Current exercise: walking  Interim History:  She is down 1 lb since her last visit. She has been journaling most of the time.  Eating all of the food on the plan., Protein intake is as prescribed, Is not skipping meals, and Water intake is adequate.   Pharmacotherapy: Tanya Peterson is on a compounding weight loss medication per Dr. Curtis.  Adverse side effects: None Hunger is well controlled.  Cravings are well controlled.  Assessment/Plan:    Hyperlipidemia LDL is at goal. Medication(s): Crestor   Cardiovascular risk factors: dyslipidemia, obesity (BMI >= 30 kg/m2), and sedentary lifestyle  Lab Results  Component Value Date   CHOL 124 05/18/2024   HDL 58 05/18/2024   LDLCALC 44 05/18/2024   TRIG 125 05/18/2024   CHOLHDL 2.0 03/13/2024   Lab Results  Component Value  Date   ALT 18 05/18/2024   AST 22 05/18/2024   ALKPHOS 106 05/18/2024   BILITOT 0.6 05/18/2024   The ASCVD Risk score (Arnett DK, et al., 2019) failed to calculate for the following reasons:   The valid total cholesterol range is 130 to 320 mg/dL  Plan:  Continue statin.  Information sheet on healthy vs unhealthy fats.  Information sheet of Belly Fat.  Will avoid all trans fats.  Will read labels Will minimize saturated fats except the following: low fat meats in moderation, diary, and limited dark chocolate.  Increase Omega 3 in foods, and consider an Omega 3 supplement.   Vitamin D  Deficiency Vitamin D  is at goal of 50.  Most recent vitamin D  level was 53.3. She is on OTC vitamin D3 5000 IU daily. Lab Results  Component Value Date   VD25OH 53.3 05/18/2024    Plan: Continue vitamin D  OTC.     Generalized Obesity: Current BMI BMI (Calculated): 28.24  Pharmacotherapy Plan   Tanya Peterson is currently in the action stage of change. As such, her goal is to continue with weight loss efforts.  She has agreed to the Category 1 plan and keeping a food journal with goal of 1,00 To 1,100  calories and 80 to 90 grams of protein daily.  Exercise goals: For substantial health benefits, adults should do at least 150 minutes (2 hours and 30 minutes) a week of moderate-intensity, or 75 minutes (1 hour and 15 minutes) a week of vigorous-intensity aerobic physical activity, or an equivalent combination of moderate- and vigorous-intensity aerobic activity. Aerobic activity should be performed in episodes of at least 10 minutes, and preferably, it should be spread throughout the week.  Behavioral modification strategies: increasing lean protein intake, no meal skipping, meal planning , increase water intake, better snacking choices, planning for success, increasing vegetables, decrease snacking , avoiding temptations, and keep healthy foods in the home.  Tanya Peterson has agreed to follow-up with our clinic  in 3 weeks.       Objective:   VITALS: Per patient if applicable, see vitals. GENERAL: Alert and in no acute distress. CARDIOPULMONARY: No increased WOB. Speaking in clear sentences.  PSYCH: Pleasant and cooperative. Speech normal rate and rhythm. Affect is appropriate. Insight and judgement are appropriate. Attention is focused, linear, and appropriate.  NEURO: Oriented as arrived to appointment on time with no prompting.   Attestation Statements:   This was prepared with the assistance of Engineer, civil (consulting).  Occasional wrong-word or sound-a-like substitutions may have occurred due to the inherent limitations of voice recognition   Clayborne Daring, DO

## 2024-07-09 NOTE — Progress Notes (Deleted)
                                                                                                              WEIGHT SUMMARY AND BIOMETRICS  No data recorded No data recorded  No data recorded No data recorded No data recorded No data recorded  OBESITY Tanya Peterson is here to discuss her progress with her obesity treatment plan along with follow-up of her obesity related diagnoses.    Nutrition Plan: the Category 1 plan and keeping a food journal with goal of *** calories and 1,000-1,100 grams of protein daily - 70-80% adherence.  Current exercise: {exercise types:16438}  Interim History:  *** {aabnutritionassessment:29213}   Pharmacotherapy: Tanya Peterson is on {dwwpharmacotherapy:29109} Adverse side effects: {dwwse:29122} Hunger is {EWCONTROLASSESSMENT:24261}.  Cravings are {EWCONTROLASSESSMENT:24261}.  Assessment/Plan:   There are no diagnoses linked to this encounter.    {dwwmorbid:29108::Morbid Obesity}: Current BMI No data recorded  Pharmacotherapy Plan {dwwmed:29123}  {dwwpharmacotherapy:29109}  Tanya Peterson {CHL AMB IS/IS NOT:210130109} currently in the action stage of change. As such, her goal is to {MWMwtloss#1:210800005}.  She has agreed to {dwwsldiets:29085}.  Exercise goals: {MWM EXERCISE RECS:23473}  Behavioral modification strategies: {dwwslwtlossstrategies:29088}.  Kanda has agreed to follow-up with our clinic in {NUMBER 1-10:22536} weeks.   No orders of the defined types were placed in this encounter.   There are no discontinued medications.   No orders of the defined types were placed in this encounter.     Objective:   VITALS: Per patient if applicable, see vitals. GENERAL: Alert and in no acute distress. CARDIOPULMONARY: No increased WOB. Speaking in clear sentences.  PSYCH: Pleasant and cooperative. Speech normal rate and rhythm. Affect is appropriate. Insight and judgement are appropriate. Attention is focused, linear, and appropriate.  NEURO:  Oriented as arrived to appointment on time with no prompting.   Attestation Statements:   This was prepared with the assistance of Engineer, civil (consulting).  Occasional wrong-word or sound-a-like substitutions may have occurred due to the inherent limitations of voice recognition

## 2024-07-14 ENCOUNTER — Ambulatory Visit: Admitting: Podiatry

## 2024-07-14 ENCOUNTER — Encounter: Payer: Self-pay | Admitting: Podiatry

## 2024-07-14 DIAGNOSIS — S93331A Other subluxation of right foot, initial encounter: Secondary | ICD-10-CM | POA: Diagnosis not present

## 2024-07-16 NOTE — Progress Notes (Signed)
 Subjective: Chief Complaint  Patient presents with   Foot Pain    Right foot ankle pain that pops. Chronic issue that comes and goes. Non diabetic. Exercises foot. 30 lbs weight lost.       63 year old female presents today for follow-up evaluation of bruising as well as ongoing ankle discomfort and swelling.  She states that she gets a popping sensation in her right ankle.  It occurs intermittently and she gets pain for short time when it pops and then goes away.  She previously did use nitro patches.  She has been working on weight loss since she is done a great job with this.    Objective: AAO x3, NAD DP/PT pulses palpable bilaterally, CRT less than 3 seconds No bruising today present.  Clinically the peroneal tendon appears to be in anatomical position.  She is able to recreate the popping sensation.  Is no area pinpoint tenderness at this time.  There is some chronic edema present at the sinus tarsi and lateral aspect which could also represent a lipoma. No pain with calf compression, swelling, warmth, erythema  Assessment: History of peroneal subluxation  Plan: -All treatment options discussed with the patient including all alternatives, risks, complications.  -We discussed possible recurrence of the peroneal subluxation given the popping sensation.  We discussed conservative versus surgical options.  Conservative and continue range of motion exercises, good supportive shoe gear.  She can use nitro patches as needed.  If needed we discussed physical therapy and possible further surgical intervention would be she agreed to hold off on this today.  Return in about 2 months (around 09/13/2024).  Donnice JONELLE Fees DPM

## 2024-07-21 ENCOUNTER — Encounter: Payer: Self-pay | Admitting: Sports Medicine

## 2024-08-05 ENCOUNTER — Ambulatory Visit: Admitting: Sports Medicine

## 2024-08-11 ENCOUNTER — Encounter: Payer: Self-pay | Admitting: Bariatrics

## 2024-08-11 ENCOUNTER — Ambulatory Visit (INDEPENDENT_AMBULATORY_CARE_PROVIDER_SITE_OTHER): Admitting: Bariatrics

## 2024-08-11 VITALS — BP 124/79 | HR 86 | Temp 97.5°F | Ht 62.5 in | Wt 151.0 lb

## 2024-08-11 DIAGNOSIS — E785 Hyperlipidemia, unspecified: Secondary | ICD-10-CM | POA: Diagnosis not present

## 2024-08-11 DIAGNOSIS — E669 Obesity, unspecified: Secondary | ICD-10-CM

## 2024-08-11 DIAGNOSIS — Z6827 Body mass index (BMI) 27.0-27.9, adult: Secondary | ICD-10-CM | POA: Diagnosis not present

## 2024-08-11 NOTE — Progress Notes (Signed)
 WEIGHT SUMMARY AND BIOMETRICS  Weight Lost Since Last Visit: 6lb  Weight Gained Since Last Visit: 0   Vitals Temp: (!) 97.5 F (36.4 C) BP: 124/79 Pulse Rate: 86 SpO2: 98 %   Anthropometric Measurements Height: 5' 2.5 (1.588 m) Weight: 151 lb (68.5 kg) BMI (Calculated): 27.16 Weight at Last Visit: 157lb Weight Lost Since Last Visit: 6lb Weight Gained Since Last Visit: 0 Starting Weight: 167lb Total Weight Loss (lbs): 16 lb (7.258 kg) Peak Weight: 190lb   Body Composition  Body Fat %: 37.8 % Fat Mass (lbs): 57.2 lbs Muscle Mass (lbs): 89.2 lbs Total Body Water (lbs): 65 lbs Visceral Fat Rating : 9   Other Clinical Data Fasting: no Labs: no Today's Visit #: 5 Starting Date: 05/18/24    OBESITY Tanya Peterson is here to discuss her progress with her obesity treatment plan along with follow-up of her obesity related diagnoses.    Nutrition Plan: the Category 1 plan and keeping a food journal with goal of 1000-1100 calories and 70-80 grams of protein daily - 75-80% adherence.  Current exercise: walking  Interim History:  She is down another 6 lbs since her last visit. She is getting bored with her eating options.  Eating all of the food on the plan., Protein intake is as prescribed, Journaling consistently., and Water intake is adequate.  Hunger is moderately controlled.  Cravings are moderately controlled.  Assessment/Plan:   Hyperlipidemia LDL is at goal. Medication(s): Crestor   Cardiovascular risk factors: dyslipidemia, obesity (BMI >= 30 kg/m2), and sedentary lifestyle  Lab Results  Component Value Date   CHOL 124 05/18/2024   HDL 58 05/18/2024   LDLCALC 44 05/18/2024   TRIG 125 05/18/2024   CHOLHDL 2.0 03/13/2024   Lab Results  Component Value Date   ALT 18 05/18/2024   AST 22 05/18/2024   ALKPHOS 106 05/18/2024   BILITOT 0.6  05/18/2024   The ASCVD Risk score (Arnett DK, et al., 2019) failed to calculate for the following reasons:   The valid total cholesterol range is 130 to 320 mg/dL  Plan:  Continue statin.  Information sheet on healthy vs unhealthy fats.  Will avoid all trans fats.  Will read labels Will minimize saturated fats except the following: low fat meats in moderation, diary, and limited dark chocolate.  She will continue to prepare her meals at home and will consider some prepared meals such as Factor or Hello Fresh.   Generalized Obesity: Current BMI BMI (Calculated): 27.16  Tanya Peterson is currently in the action stage of change. As such, her goal is to continue with weight loss efforts.  She has agreed to the Category 1 plan and keeping a food journal with goal of 1,000 to 1,100  calories and 70 to 80 grams of protein daily.  Exercise goals: All adults should avoid inactivity. Some physical activity is better than none, and  adults who participate in any amount of physical activity gain some health benefits.  Behavioral modification strategies: increasing lean protein intake, decreasing simple carbohydrates , no meal skipping, meal planning , better snacking choices, planning for success, increasing vegetables, decrease snacking , avoiding temptations, keep healthy foods in the home, and mindful eating.  Tanya Peterson has agreed to follow-up with our clinic in 4 weeks.   Objective:   VITALS: Per patient if applicable, see vitals. GENERAL: Alert and in no acute distress. CARDIOPULMONARY: No increased WOB. Speaking in clear sentences.  PSYCH: Pleasant and cooperative. Speech normal rate and rhythm. Affect is appropriate. Insight and judgement are appropriate. Attention is focused, linear, and appropriate.  NEURO: Oriented as arrived to appointment on time with no prompting.   Attestation Statements:   This was prepared with the assistance of Engineer, civil (consulting).  Occasional wrong-word or  sound-a-like substitutions may have occurred due to the inherent limitations of voice recognition   Clayborne Daring, DO

## 2024-08-20 ENCOUNTER — Encounter: Payer: Self-pay | Admitting: Internal Medicine

## 2024-08-20 ENCOUNTER — Other Ambulatory Visit: Payer: Self-pay

## 2024-08-20 ENCOUNTER — Ambulatory Visit: Admitting: Internal Medicine

## 2024-08-20 VITALS — BP 120/74 | HR 74 | Temp 97.9°F | Resp 16 | Ht 63.0 in | Wt 150.9 lb

## 2024-08-20 DIAGNOSIS — E739 Lactose intolerance, unspecified: Secondary | ICD-10-CM | POA: Diagnosis not present

## 2024-08-20 DIAGNOSIS — T7819XD Other adverse food reactions, not elsewhere classified, subsequent encounter: Secondary | ICD-10-CM | POA: Diagnosis not present

## 2024-08-20 DIAGNOSIS — J3089 Other allergic rhinitis: Secondary | ICD-10-CM | POA: Diagnosis not present

## 2024-08-20 DIAGNOSIS — J4530 Mild persistent asthma, uncomplicated: Secondary | ICD-10-CM | POA: Diagnosis not present

## 2024-08-20 DIAGNOSIS — K219 Gastro-esophageal reflux disease without esophagitis: Secondary | ICD-10-CM

## 2024-08-20 DIAGNOSIS — J302 Other seasonal allergic rhinitis: Secondary | ICD-10-CM

## 2024-08-20 MED ORDER — ALBUTEROL SULFATE HFA 108 (90 BASE) MCG/ACT IN AERS: 1.0000 | INHALATION_SPRAY | RESPIRATORY_TRACT | 3 refills | Status: AC | PRN

## 2024-08-20 MED ORDER — OMEPRAZOLE 40 MG PO CPDR: 40.0000 mg | DELAYED_RELEASE_CAPSULE | Freq: Every day | ORAL | 5 refills | Status: AC

## 2024-08-20 MED ORDER — SYMBICORT 160-4.5 MCG/ACT IN AERO: 2.0000 | INHALATION_SPRAY | Freq: Two times a day (BID) | RESPIRATORY_TRACT | 5 refills | Status: AC

## 2024-08-20 NOTE — Patient Instructions (Addendum)
 Seasonal and perennial allergic rhinitis  - allergen avoidance towards grasses, trees, fusarium mold, cats, dogs, mice - consider allergy shots as long term control - call us  back if you decide to proceed with this. Will need to update your allergy testing - avoid nasal sprays due to history of nose bleeds -Consider nasal saline rinses and ayr gel as needed - Continue over the counter antihistamine daily or daily as needed.  Can take an extra dose for breakthrough symptoms -Your options include Zyrtec (Cetirizine) 10mg , Claritin/Allerclear (Loratadine) 10mg , Allegra (Fexofenadine) 180mg , or Xyzal (Levocetirinze) 5mg   Allergic Conjunctivitis: - Continue Allergy Eye drops: great options include Pataday  (Olopatadine ) or Zaditor (ketotifen) for eye symptoms daily as needed  Mild Persistent Asthma: eosinophilic - Controller med: Symbicort  160 mcg 2 puffs twice daily with a spacer. Rinse mouth after use during fall and winter, Can decrease to once daily once weather turns warmer for spring and summer. For respiratory illness/asthma flare: Increase Symbicort  to 3 puffs twice daily for 1-2 weeks until symptoms resolve. - Rescue Inhaler: Albuterol  (Proair /Ventolin ) 2 puffs . Use  every 4-6 hours as needed for chest tightness, wheezing, or coughing.  Can also use 15 minutes prior to exercise if you have symptoms with activity. - Asthma is not controlled if:  - Symptoms are occurring >2 times a week OR  - >2 times a month nighttime awakenings  - You are requiring systemic steroids (prednisone /steroid injections) more than once per year  - Your require hospitalization for your asthma.  - Please call the clinic to schedule a follow up if these symptoms arise Consider an injectable asthma medication such as fasenra or nucala-will help reduce eosinophils which are contributing to your eosinophils  Concerns regarding food allergy Your symptoms with gluten and spices are more consistent with an intolerance,  avoidance is optional, but will help you avoid unwanted symptoms  History of sensation of throat tightening after eating tyramines: suspected intolerance - suspect intolerance, but will prescribe epinephrine  autoinjector to be used in event of airway symptoms (throat swelling, trouble breathing, coughing, wheezing) following food ingestion - please seek immediate medical attention if using epinephrine  autoinjector - please ask for a tryptase level to be drawn during a reaction if recurs  Lactose Intolerance- - choose lactose free dairy or take lactaid prior to eating dairy products that contain lactose  Reflux- not at goal Lifestyle and diet modifications Symptoms NOT consistent for food allergy - add omeprazole 40 mg daily - take on empty stomach. Wait 30 minutes prior to eating. - referral to GI   Follow-up in 6 months, sooner if needed. It was a pleasure seeing you again in clinic today! Thank you for allowing me to participate in your care.  Rocky Endow, MD [facility]  Gastroesophageal Reflux Induced Respiratory Disease and Laryngopharyngeal Reflux (LPR): Gastroesophageal reflux disease (GERD) is a condition where the contents of the stomach reflux or back up into the esophagus or swallowing tube.  This can result in a variety of clinical symptoms including classic symptoms and atypical symptoms.  Classic symptoms of GERD include: heartburn, chest pain, acid taste in the mouth, and difficulty in swallowing.  Atypical symptoms of GERD include laryngopharyngeal reflux (LPR) and asthma.  LPR occurs when stomach reflux comes all the way up to the throat.  Clinical symptoms include hoarseness, raspy voice, laryngitis, throat clearing, postnasal drip, mucus stuck in the throat, a sensation of a lump in the throat, sore throat, and cough.  Most patients  with LPR do not have classic symptoms of GERD.  Asthma can also be triggered by GERD.  The acid stomach fluid can  stimulate nerve fibers in the esophagus which can cause an increase in bronchial muscle tone and narrowing of the airways.  Acid stomach contents may also reflux into the trachea and bronchi of the lungs where it can trigger an asthma attack.  Many people with GERD triggered asthma do not have classic symptoms of GERD.  Diagnosis of LPR and GERD induced asthma is frequently made from a typical history and response to medications.  It may take several months of medications to see a good response.  Occasionally, a 24-hour esophageal pH probe study must be performed.  Treatment of GERD/LPR includes:   Modification of diet and lifestyle Stop smoking Avoid overeating and lose weight Avoid acidic and fatty foods, chocolate, onions, garlic, peppermint Elevate the head of your bed 6 to 8 inches with blocks or wedge Medications Zantac, Pepcid, Axid, Tagamet Prilosec, Prevacid, Aciphex, Protonix, Nexium Surgery

## 2024-08-20 NOTE — Progress Notes (Signed)
 FOLLOW UP Date of Service/Encounter:   08/20/2024  Subjective:  Tanya Peterson (DOB: October 17, 1961) is a 63 y.o. female who [facility] in re-evaluation of the following: Allergic rhinitis, asthma, food intolerances  History obtained from: chart review and patient.  For Review, LV was on 02/19/24  with Dr.Tulio Facundo seen for routine follow-up. See below for summary of history and diagnostics.   Therapeutic plans/changes recommended: FEV1 86%, we discussed starting allergy injections as a way to improve control of her allergic rhinitis which was not controlled at that time. We also discussed starting and anti-il5 for asthma control. ----------------------------------------------------- Pertinent History/Diagnostics:  Asthma:  mild intermittent; Covid infection Dec 2022, avoiding singulair  due to concerns regarding side effects - 10/30/21 spirometry: ratio 115%, 1.60L, 64% predicted FEV1 - mild restriction. --  AEC (09/12/2022): 200 -CXR on (07/25/22): impression-normal - pulm eval 01/09/23-planning for full PFTs and CT chest. - CT chest 01/28/23: 1. No compelling findings of interstitial lung disease at this time.  Nonspecific mild patchy subpleural reticulation and ground-glass opacity in the dependent lungs, without traction bronchiectasis or honeycombing, favoring hypoventilatory change. Follow-up high-resolution chest CT with prone imaging may be considered in 12 months to assess temporal pattern stability as clinically warranted.  2. A few scattered tiny solid bilateral pulmonary nodules, largest 0.3 cm.  3.  Aortic Atherosclerosis (ICD10-I70.0).  Allergic Rhinitis and conjunctivitis:  avoiding nasal sprays due to history of nosebleeds, required cauterization of right nostril on 06/19/2022 Not interested in singulair  due to potential side effects.  - SPT environmental panel (10/30/2021): + grasses, trees, fusarium mold, cats, dogs, mice Food  intolerance (gluten and spices) Hx of reaction: Extreme sleepiness  -- SPT select foods (10/30/2021): Negative to wheat, barley, oat, rye, karaya gum, Acacia, cinnamon, nutmeg, ginger, garlic, pepper, mustard --------------------------------------------------- Today presents for follow-up. Discussed the use of AI scribe software for clinical note transcription with the patient, who gave verbal consent to proceed.  History of Present Illness Tanya Peterson is a 63 year old female with asthma and acid reflux who presents for follow-up on her asthma and reflux management.  Asthma symptoms and control - Asthma well-controlled over the summer - No use of rescue inhaler during the summer - Continues Symbicort  160 mcg three times a week during the summer - No need for prednisone  or antibiotics since last visit - Occasional cough with mucus production, which recurred recently after being symptom-free over the summer; associated with increase in reflux symptoms  Allergic rhinitis symptoms - Allergy symptoms are seasonal and stable - Switched from Allegra to Claritin with effective symptom control  Gastroesophageal reflux and gastrointestinal symptoms - Persistent acid reflux symptoms despite avoidance of known triggers such as tomatoes and regular coffee - Uses cold brew coffee and maintains a food journal - Bloating and abdominal discomfort, sometimes without clear dietary triggers - Uses Tums and Gas-X as needed for symptom relief - No recent use of long-term acid suppression therapy - Cough with mucus production started recently - History of bloating and discomfort with probiotics, which are avoided due to adverse effects  Dietary management and weight control - Engaged in dietary changes and weight management efforts, including consultation with a nutritionist and adherence to a healthy diet - On a compounded vitamin regimen since May without need for dose increase  Colorectal cancer  screening - No recent colonoscopy; previous colonoscopy was reported normal (2015?)  All medications reviewed by clinical staff and updated in chart. No new  pertinent medical or surgical history except as noted in HPI.  ROS: All others negative except as noted per HPI.   Objective:  BP 120/74 (BP Location: Left Arm, Patient Position: Sitting, Cuff Size: Normal)   Pulse 74   Temp 97.9 F (36.6 C) (Temporal)   Resp 16   Ht 5' 3 (1.6 m)   Wt 150 lb 14.4 oz (68.4 kg)   SpO2 96%   BMI 26.73 kg/m  Body mass index is 26.73 kg/m. Physical Exam: General Appearance:  Alert, cooperative, no distress, appears stated age  Head:  Normocephalic, without obvious abnormality, atraumatic  Eyes:  Conjunctiva clear, EOM's intact  Ears EACs normal bilaterally and normal TMs bilaterally  Nose: Nares normal, hypertrophic turbinates, normal mucosa, and no visible anterior polyps  Throat: Lips, tongue normal; teeth and gums normal, normal posterior oropharynx  Neck: Supple, symmetrical  Lungs:   clear to auscultation bilaterally, Respirations unlabored, no coughing  Heart:  regular rate and rhythm and no murmur, Appears well perfused  Extremities: No edema  Skin: Skin color, texture, turgor normal and no rashes or lesions on visualized portions of skin  Neurologic: No gross deficits   Labs:  Lab Orders  No laboratory test(s) ordered today    Spirometry:  Tracings reviewed. Her effort: Good reproducible efforts. FVC: 2.66L FEV1: 1.95L, 83% predicted FEV1/FVC ratio: 0.73 Interpretation: Spirometry consistent with normal pattern.  Please see scanned spirometry results for details.  Assessment/Plan   Seasonal and perennial allergic rhinitis -at goal - allergen avoidance towards grasses, trees, fusarium mold, cats, dogs, mice - consider allergy shots as long term control - call us  back if you decide to proceed with this. Will need to update your allergy testing - avoid nasal sprays due to  history of nose bleeds -Consider nasal saline rinses and ayr gel as needed - Continue over the counter antihistamine daily or daily as needed.  Can take an extra dose for breakthrough symptoms -Your options include Zyrtec (Cetirizine) 10mg , Claritin/Allerclear (Loratadine) 10mg , Allegra (Fexofenadine) 180mg , or Xyzal (Levocetirinze) 5mg   Allergic Conjunctivitis: at goal - Continue Allergy Eye drops: great options include Pataday  (Olopatadine ) or Zaditor (ketotifen) for eye symptoms daily as needed  Mild Persistent Asthma: eosinophilic; at goal currently - Controller med: Symbicort  160 mcg 2 puffs twice daily with a spacer. Rinse mouth after use during fall and winter, Can decrease to once daily once weather turns warmer for spring and summer. For respiratory illness/asthma flare: Increase Symbicort  to 3 puffs twice daily for 1-2 weeks until symptoms resolve. - Rescue Inhaler: Albuterol  (Proair /Ventolin ) 2 puffs . Use  every 4-6 hours as needed for chest tightness, wheezing, or coughing.  Can also use 15 minutes prior to exercise if you have symptoms with activity. - Asthma is not controlled if:  - Symptoms are occurring >2 times a week OR  - >2 times a month nighttime awakenings  - You are requiring systemic steroids (prednisone /steroid injections) more than once per year  - Your require hospitalization for your asthma.  - Please call the clinic to schedule a follow up if these symptoms arise Consider an injectable asthma medication such as fasenra or nucala-will help reduce eosinophils which are contributing to your eosinophils  Concerns regarding food allergy/suspected intolerances-stable Your symptoms with gluten and spices are more consistent with an intolerance, avoidance is optional, but will help you avoid unwanted symptoms  History of sensation of throat tightening after eating tyramines: suspected intolerance - suspect intolerance, but will prescribe epinephrine  autoinjector to be  used in event of airway symptoms (throat swelling, trouble breathing, coughing, wheezing) following food ingestion - please seek immediate medical attention if using epinephrine  autoinjector - please ask for a tryptase level to be drawn during a reaction if recurs  Lactose Intolerance-stable - choose lactose free dairy or take lactaid prior to eating dairy products that contain lactose  Reflux- not at goal Lifestyle and diet modifications Symptoms NOT consistent for food allergy - add omeprazole 40 mg daily - take on empty stomach. Wait 30 minutes prior to eating. - referral to GI   Follow-up in 6 months, sooner if needed. It was a pleasure seeing you again in clinic today! Thank you for allowing me to participate in your care.  Other: none  Rocky Endow, MD  [facility] 

## 2024-08-26 ENCOUNTER — Telehealth: Payer: Self-pay | Admitting: Internal Medicine

## 2024-08-26 NOTE — Telephone Encounter (Signed)
 Tanya Peterson has been internally referred to [facility].  They will reach out to the patient to schedule.  I will follow up in a week.

## 2024-09-01 ENCOUNTER — Ambulatory Visit (INDEPENDENT_AMBULATORY_CARE_PROVIDER_SITE_OTHER): Admitting: Bariatrics

## 2024-09-01 ENCOUNTER — Encounter: Payer: Self-pay | Admitting: Bariatrics

## 2024-09-01 VITALS — BP 105/70 | HR 84 | Temp 97.5°F | Ht 62.5 in | Wt 145.0 lb

## 2024-09-01 DIAGNOSIS — E669 Obesity, unspecified: Secondary | ICD-10-CM | POA: Diagnosis not present

## 2024-09-01 DIAGNOSIS — Z6826 Body mass index (BMI) 26.0-26.9, adult: Secondary | ICD-10-CM

## 2024-09-01 DIAGNOSIS — E559 Vitamin D deficiency, unspecified: Secondary | ICD-10-CM

## 2024-09-01 DIAGNOSIS — F4312 Post-traumatic stress disorder, chronic: Secondary | ICD-10-CM | POA: Diagnosis not present

## 2024-09-01 DIAGNOSIS — E785 Hyperlipidemia, unspecified: Secondary | ICD-10-CM

## 2024-09-01 DIAGNOSIS — Z0000 Encounter for general adult medical examination without abnormal findings: Secondary | ICD-10-CM

## 2024-09-01 NOTE — Progress Notes (Signed)
 WEIGHT SUMMARY AND BIOMETRICS  Weight Lost Since Last Visit: 6lb  Weight Gained Since Last Visit: 0   Vitals Temp: (!) 97.5 F (36.4 C) BP: 105/70 Pulse Rate: 84 SpO2: 97 %   Anthropometric Measurements Height: 5' 2.5 (1.588 m) Weight: 145 lb (65.8 kg) BMI (Calculated): 26.08 Weight at Last Visit: 151lb Weight Lost Since Last Visit: 6lb Weight Gained Since Last Visit: 0 Starting Weight: 167lb Total Weight Loss (lbs): 22 lb (9.979 kg) Peak Weight: 190lb   Body Composition  Body Fat %: 36.6 % Fat Mass (lbs): 53.2 lbs Muscle Mass (lbs): 87.6 lbs Total Body Water (lbs): 61.6 lbs Visceral Fat Rating : 9   Other Clinical Data Fasting: yes Labs: no Today's Visit #: 6 Starting Date: 05/18/24    OBESITY Mikaia is here to discuss her progress with her obesity treatment plan along with follow-up of her obesity related diagnoses.    Nutrition Plan: the Category 1 plan and keeping a food journal with goal of 1000-1100 calories and 70-80 grams of protein daily - 80% adherence.  Current exercise: walking  Interim History:  She is down 6 lbs since her last visit.  Not eating all of the food on the plan., Protein intake is as prescribed, Is skipping meals, and Water intake is adequate.   Pharmacotherapy: Jimena is on a compounding GLP-1.  Adverse side effects: None Hunger is well controlled.  Cravings are well controlled.  Assessment/Plan:    Hyperlipidemia LDL is at goal. Medication(s): Crestor  Cardiovascular risk factors: dyslipidemia and sedentary lifestyle  Lab Results  Component Value Date   CHOL 124 05/18/2024   HDL 58 05/18/2024   LDLCALC 44 05/18/2024   TRIG 125 05/18/2024   CHOLHDL 2.0 03/13/2024   Lab Results  Component Value Date   ALT 18 05/18/2024   AST 22 05/18/2024   ALKPHOS 106 05/18/2024   BILITOT 0.6 05/18/2024   The  ASCVD Risk score (Arnett DK, et al., 2019) failed to calculate for the following reasons:   The valid total cholesterol range is 130 to 320 mg/dL  Plan:  Continue statin.  Will avoid all trans fats.  Will read labels Will minimize saturated fats except the following: low fat meats in moderation, diary, and limited dark chocolate.  Will add in some resistance bands and weights to build muscle.   Labs done today (CMP, Lipids,  insulin , vitamin D ).    Health Maintenance:   Obesity   Plan: Will do labs (see above).   Vitamin D  Deficiency Vitamin D  is at goal of 50.  Most recent vitamin D  level was 53.3.  Lab Results  Component Value Date   VD25OH 53.3 05/18/2024    Plan: Will recheck her vitamin D  today.      Generalized Obesity: Current BMI BMI (Calculated): 26.08   She can start spreading out her compounding GLP-1 to every  10 days.   Pharmacotherapy Plan She will continue her GLP-1 every 10 days (per another provider).   Jaylani is currently in the action stage of change. As such, her goal is to continue with weight loss efforts.  She has agreed to keeping a food journal with goal of 1,000 to 1,100 calories and 70 to 80 grams of protein daily.  Exercise goals: All adults should avoid inactivity. Some physical activity is better than none, and adults who participate in any amount of physical activity gain some health benefits. She is walking more.   Behavioral modification strategies: increasing lean protein intake, no meal skipping, meal planning , planning for success, increasing vegetables, avoiding temptations, keep healthy foods in the home, weigh protein portions, measure portion sizes, and mindful eating.  Cameka has agreed to follow-up with our clinic in 4 weeks.      Objective:   VITALS: Per patient if applicable, see vitals. GENERAL: Alert and in no acute distress. CARDIOPULMONARY: No increased WOB. Speaking in clear sentences.  PSYCH: Pleasant and  cooperative. Speech normal rate and rhythm. Affect is appropriate. Insight and judgement are appropriate. Attention is focused, linear, and appropriate.  NEURO: Oriented as arrived to appointment on time with no prompting.   Attestation Statements:   This was prepared with the assistance of Engineer, civil (consulting).  Occasional wrong-word or sound-a-like substitutions may have occurred due to the inherent limitations of voice recognition   Clayborne Daring, DO

## 2024-09-02 ENCOUNTER — Ambulatory Visit: Payer: Self-pay | Admitting: Bariatrics

## 2024-09-02 LAB — LIPID PANEL WITH LDL/HDL RATIO
Cholesterol, Total: 100 mg/dL (ref 100–199)
HDL: 51 mg/dL (ref >39)
LDL Chol Calc (NIH): 34 mg/dL (ref 0–99)
LDL/HDL Ratio: 0.7 ratio (ref 0.0–3.2)
Triglycerides: 71 mg/dL (ref 0–149)
VLDL Cholesterol Cal: 15 mg/dL (ref 5–40)

## 2024-09-02 LAB — COMPREHENSIVE METABOLIC PANEL WITH GFR
ALT: 20 IU/L (ref 0–32)
AST: 24 IU/L (ref 0–40)
Albumin: 4.7 g/dL (ref 3.9–4.9)
Alkaline Phosphatase: 94 IU/L (ref 49–135)
BUN/Creatinine Ratio: 29 — ABNORMAL HIGH (ref 12–28)
BUN: 20 mg/dL (ref 8–27)
Bilirubin Total: 0.5 mg/dL (ref 0.0–1.2)
CO2: 22 mmol/L (ref 20–29)
Calcium: 10.2 mg/dL (ref 8.7–10.3)
Chloride: 104 mmol/L (ref 96–106)
Creatinine, Ser: 0.68 mg/dL (ref 0.57–1.00)
Globulin, Total: 1.9 g/dL (ref 1.5–4.5)
Glucose: 80 mg/dL (ref 70–99)
Potassium: 4.7 mmol/L (ref 3.5–5.2)
Sodium: 141 mmol/L (ref 134–144)
Total Protein: 6.6 g/dL (ref 6.0–8.5)
eGFR: 98 mL/min/1.73 (ref >59)

## 2024-09-02 LAB — INSULIN, RANDOM: INSULIN: 10.2 u[IU]/mL (ref 2.6–24.9)

## 2024-09-02 LAB — VITAMIN D 25 HYDROXY (VIT D DEFICIENCY, FRACTURES): Vit D, 25-Hydroxy: 74.3 ng/mL (ref 30.0–100.0)

## 2024-09-10 ENCOUNTER — Ambulatory Visit: Admitting: Family

## 2024-09-10 ENCOUNTER — Encounter: Payer: Self-pay | Admitting: Family

## 2024-09-10 ENCOUNTER — Telehealth: Payer: Self-pay | Admitting: Internal Medicine

## 2024-09-10 VITALS — BP 116/70 | HR 98 | Temp 97.7°F | Resp 20 | Wt 149.9 lb

## 2024-09-10 DIAGNOSIS — H1013 Acute atopic conjunctivitis, bilateral: Secondary | ICD-10-CM | POA: Diagnosis not present

## 2024-09-10 DIAGNOSIS — J069 Acute upper respiratory infection, unspecified: Secondary | ICD-10-CM

## 2024-09-10 DIAGNOSIS — J302 Other seasonal allergic rhinitis: Secondary | ICD-10-CM | POA: Diagnosis not present

## 2024-09-10 DIAGNOSIS — J3089 Other allergic rhinitis: Secondary | ICD-10-CM | POA: Diagnosis not present

## 2024-09-10 MED ORDER — PREDNISONE 20 MG PO TABS: 20.0000 mg | ORAL_TABLET | Freq: Every day | ORAL | 0 refills | Status: AC

## 2024-09-10 MED ORDER — AMOXICILLIN-POT CLAVULANATE 875-125 MG PO TABS: 1.0000 | ORAL_TABLET | Freq: Two times a day (BID) | ORAL | 0 refills | Status: AC

## 2024-09-10 NOTE — Telephone Encounter (Signed)
 Pt informed and scheduled today with Chrissie.

## 2024-09-10 NOTE — Telephone Encounter (Signed)
 Thanks

## 2024-09-10 NOTE — Telephone Encounter (Signed)
Patient called back to return the call.

## 2024-09-10 NOTE — Progress Notes (Signed)
 [address] [place] [address] Dept: [phone]  FOLLOW UP NOTE  Patient ID: Tanya Peterson, female    DOB: 18-Nov-1961  Age: 63 y.o. MRN: [id number] Date of Office Visit: 09/10/2024  Assessment  Chief Complaint: Sinusitis  HPI Tanya Peterson is a 63 year old female who presents today for possible sinus infection.  She was last seen on August 20, 2024 by Dr. Marinda for seasonal and perennial allergic rhinitis, allergic conjunctivitis, mild persistent asthma, concerns regarding food allergy/suspected intolerance, history of sensation of throat tightening after eating tyramines, lactose intolerance, and reflux.  She denies any new diagnosis or surgery since her last office visit.  She reports approximately 2 to 3 days ago she felt scratchy throat.  It felt like drainage.  She drank some warm tea and felt better.  That night she slept with cough drops that did not help.  She started getting a cough with more phlegm.  It is very clear in color.  She wonders if it was her allergies, her eyes were itchy and she was sneezing.  This morning her throat was fine.  She did saline rinse yesterday.  She feels like it is getting in her head.  She reports that Dr. Marinda normally treats her aggressively due to her history of pneumonia and bronchitis.  She does not want to get sick and have to go to someone she does not know over the weekend.  Yesterday her temperature was up a degree and she could feel it.  It was 98.7 F.  Normally her temperature is 97.6 F.  She reports nasal congestion, clear rhinorrhea, postnasal drip and slight headaches.  Last night she took Tylenol  and had a bit of a headache this morning, but not now.  She has not tested for COVID-19.  She has been taking Claritin-D or Allegra.  She uses olopatadine  for her itchy watery eyes and it helps, but it does not last long.  Mild persistent asthma: She continues to take Symbicort  160/4.5 mcg 2 puffs twice a day with a spacer.  She has not had  any real asthma attacks since we last saw her.  She did feel something like she was going to be wheezy, but did not hear anything.  She reports a little bit of cough that she thinks is due to drainage.  She denies wheezing, tightness in chest, shortness of breath, and nocturnal awakenings due to breathing problems.  Since her last office visit she has not required any systemic steroids or made any trips to the emergency room or urgent care due to breathing problems.  She has not had to use her albuterol  in months.  She has not had to increase her Symbicort  to 3 puffs twice a day during respiratory illnesses/asthma flares.   Drug Allergies:  Allergies  Allergen Reactions   Other Other (See Comments)    Glutein - puts to sleep -teramines - Elevated heart rate Glutein - puts to sleep -teramines - Elevated heart rate Glutein - puts to sleep -teramines - Elevated heart rate Glutein - puts to sleep -teramines - Elevated heart rate   Tyramine Palpitations    Cause Tachycardia   Lactose Diarrhea and Other (See Comments)   No Known Allergies    Lactulose Other (See Comments)    Upset stomach Upset stomach Upset stomach   Neosporin [Neomycin-Bacitracin Zn-Polymyx] Rash    Review of Systems: Negative except as per HPI  Physical Exam: BP 116/70 (BP Location: Left Arm, Patient Position: Sitting, Cuff  Size: Normal)   Pulse 98   Temp 97.7 F (36.5 C) (Temporal)   Resp 20   Wt 149 lb 14.4 oz (68 kg)   SpO2 97%   BMI 26.98 kg/m    Physical Exam Constitutional:      Appearance: Normal appearance.  HENT:     Head: Normocephalic and atraumatic.     Comments: Pharynx normal, eyes normal, ears normal, nose: Bilateral lower turbinates mildly edematous with clear drainage noted    Right Ear: Tympanic membrane, ear canal and external ear normal.     Left Ear: Tympanic membrane, ear canal and external ear normal.     Mouth/Throat:     Mouth: Mucous membranes are moist.     Pharynx: Oropharynx  is clear.  Eyes:     Conjunctiva/sclera: Conjunctivae normal.  Cardiovascular:     Rate and Rhythm: Regular rhythm.     Heart sounds: Normal heart sounds.  Pulmonary:     Effort: Pulmonary effort is normal.     Breath sounds: Normal breath sounds.     Comments: Lungs clear to auscultation Musculoskeletal:     Cervical back: Neck supple.  Skin:    General: Skin is warm.  Neurological:     Mental Status: She is alert and oriented to person, place, and time.  Psychiatric:        Mood and Affect: Mood normal.        Behavior: Behavior normal.        Thought Content: Thought content normal.        Judgment: Judgment normal.     Diagnostics:  None  Assessment and Plan: 1. Seasonal and perennial allergic rhinitis   2. Allergic conjunctivitis of both eyes   3. Viral upper respiratory tract infection     Meds ordered this encounter  Medications   predniSONE  (DELTASONE ) 20 MG tablet    Sig: Take 1 tablet (20 mg total) by mouth daily with breakfast.    Dispense:  4 tablet    Refill:  0   amoxicillin -clavulanate (AUGMENTIN ) 875-125 MG tablet    Sig: Take 1 tablet by mouth 2 (two) times daily.    Dispense:  14 tablet    Refill:  0    Patient Instructions  Seasonal and perennial allergic rhinitis  -Discussed that due to her history. Lets start prednisone  20 mg taking  1 tablet daily for 4 days. If symptoms are not better in 4 days start Augmentin  875 mg taking 1 tablet twice a day for 7 days - allergen avoidance towards grasses, trees, fusarium mold, cats, dogs, mice - consider allergy shots as long term control - call us  back if you decide to proceed with this. Will need to update your allergy testing - avoid nasal sprays due to history of nose bleeds -Consider nasal saline rinses and ayr gel as needed - Continue over the counter antihistamine daily or daily as needed.  Can take an extra dose for breakthrough symptoms -Your options include Zyrtec (Cetirizine) 10mg ,  Claritin/Allerclear (Loratadine) 10mg , Allegra (Fexofenadine) 180mg , or Xyzal (Levocetirinze) 5mg   Allergic Conjunctivitis: - Continue Allergy Eye drops: great options include Pataday  (Olopatadine ) or Zaditor (ketotifen) for eye symptoms daily as needed  Mild Persistent Asthma: eosinophilic - Controller med: Symbicort  160 mcg 2 puffs twice daily with a spacer. Rinse mouth after use during fall and winter, Can decrease to once daily once weather turns warmer for spring and summer. For respiratory illness/asthma flare: Increase Symbicort  to 3 puffs twice daily for  1-2 weeks until symptoms resolve. - Rescue Inhaler: Albuterol  (Proair /Ventolin ) 2 puffs . Use  every 4-6 hours as needed for chest tightness, wheezing, or coughing.  Can also use 15 minutes prior to exercise if you have symptoms with activity. - Asthma is not controlled if:  - Symptoms are occurring >2 times a week OR  - >2 times a month nighttime awakenings  - You are requiring systemic steroids (prednisone /steroid injections) more than once per year  - Your require hospitalization for your asthma.  - Please call the clinic to schedule a follow up if these symptoms arise Consider an injectable asthma medication such as fasenra or nucala-will help reduce eosinophils which are contributing to your eosinophils  Concerns regarding food allergy Your symptoms with gluten and spices are more consistent with an intolerance, avoidance is optional, but will help you avoid unwanted symptoms  History of sensation of throat tightening after eating tyramines: suspected intolerance - suspect intolerance, but will prescribe epinephrine  autoinjector to be used in event of airway symptoms (throat swelling, trouble breathing, coughing, wheezing) following food ingestion - please seek immediate medical attention if using epinephrine  autoinjector - please ask for a tryptase level to be drawn during a reaction if recurs  Lactose Intolerance- - choose  lactose free dairy or take lactaid prior to eating dairy products that contain lactose  Reflux- not at goal Lifestyle and diet modifications Symptoms NOT consistent for food allergy - Continue omeprazole 40 mg daily - take on empty stomach. Wait 30 minutes prior to eating. - referral to GI as per last office visit   Follow-up in 6 months, sooner if needed.     Return in about 6 months (around 03/11/2025), or if symptoms worsen or fail to improve.    Thank you for the opportunity to care for this patient.  Please do not hesitate to contact me with questions.  Wanda Craze, FNP [facility] 

## 2024-09-10 NOTE — Telephone Encounter (Signed)
 Tried calling and went straight to voicemail. Left message for a return call.

## 2024-09-10 NOTE — Telephone Encounter (Signed)
 Tanya Peterson called and stated that she is having seasonal allergies causing her to get a sinus infection that is getting progressively worse. She is asking if some Augmentin  or some type of antibiotics could be sent in for her. If she needs to come in she will, but wanted to ask first if it could just be sent in. She uses the [facility] in [place] on [address].

## 2024-09-10 NOTE — Telephone Encounter (Signed)
 Spoke with Tanya Peterson and she is very congested- nasally. She has head congestion as well and coughing- drainage is clear. Ongoing for 3 days. Using neil med sinus rinses, Claritin-D and Symbicort . Not needing albuterol . She thinks she needs an antibiotic and wants to be aggressive and jump on top of this before it gets worse. I offered her an appointment with Chrissie this afternoon at 4 but she declined because she says Dr Marinda knows her better. Please advise.

## 2024-09-10 NOTE — Telephone Encounter (Signed)
 Hi. Let her know that I am in clinic with Chrissie, but unfortunately my schedule is full the next 2 days. If Chrissie has any questions, I am happy to answer them.

## 2024-09-10 NOTE — Patient Instructions (Addendum)
 Seasonal and perennial allergic rhinitis  -Discussed that due to her history. Lets start prednisone  20 mg taking  1 tablet daily for 4 days. If symptoms are not better in 4 days start Augmentin  875 mg taking 1 tablet twice a day for 7 days - allergen avoidance towards grasses, trees, fusarium mold, cats, dogs, mice - consider allergy shots as long term control - call us  back if you decide to proceed with this. Will need to update your allergy testing - avoid nasal sprays due to history of nose bleeds -Consider nasal saline rinses and ayr gel as needed - Continue over the counter antihistamine daily or daily as needed.  Can take an extra dose for breakthrough symptoms -Your options include Zyrtec (Cetirizine) 10mg , Claritin/Allerclear (Loratadine) 10mg , Allegra (Fexofenadine) 180mg , or Xyzal (Levocetirinze) 5mg   Allergic Conjunctivitis: - Continue Allergy Eye drops: great options include Pataday  (Olopatadine ) or Zaditor (ketotifen) for eye symptoms daily as needed  Mild Persistent Asthma: eosinophilic - Controller med: Symbicort  160 mcg 2 puffs twice daily with a spacer. Rinse mouth after use during fall and winter, Can decrease to once daily once weather turns warmer for spring and summer. For respiratory illness/asthma flare: Increase Symbicort  to 3 puffs twice daily for 1-2 weeks until symptoms resolve. - Rescue Inhaler: Albuterol  (Proair /Ventolin ) 2 puffs . Use  every 4-6 hours as needed for chest tightness, wheezing, or coughing.  Can also use 15 minutes prior to exercise if you have symptoms with activity. - Asthma is not controlled if:  - Symptoms are occurring >2 times a week OR  - >2 times a month nighttime awakenings  - You are requiring systemic steroids (prednisone /steroid injections) more than once per year  - Your require hospitalization for your asthma.  - Please call the clinic to schedule a follow up if these symptoms arise Consider an injectable asthma medication such as  fasenra or nucala-will help reduce eosinophils which are contributing to your eosinophils  Concerns regarding food allergy Your symptoms with gluten and spices are more consistent with an intolerance, avoidance is optional, but will help you avoid unwanted symptoms  History of sensation of throat tightening after eating tyramines: suspected intolerance - suspect intolerance, but will prescribe epinephrine  autoinjector to be used in event of airway symptoms (throat swelling, trouble breathing, coughing, wheezing) following food ingestion - please seek immediate medical attention if using epinephrine  autoinjector - please ask for a tryptase level to be drawn during a reaction if recurs  Lactose Intolerance- - choose lactose free dairy or take lactaid prior to eating dairy products that contain lactose  Reflux- not at goal Lifestyle and diet modifications Symptoms NOT consistent for food allergy - Continue omeprazole 40 mg daily - take on empty stomach. Wait 30 minutes prior to eating. - referral to GI as per last office visit   Follow-up in 6 months, sooner if needed.

## 2024-09-14 ENCOUNTER — Encounter: Payer: Self-pay | Admitting: Podiatry

## 2024-09-14 ENCOUNTER — Ambulatory Visit: Admitting: Podiatry

## 2024-09-14 VITALS — Ht 62.5 in | Wt 149.0 lb

## 2024-09-14 DIAGNOSIS — S93331A Other subluxation of right foot, initial encounter: Secondary | ICD-10-CM

## 2024-09-14 NOTE — Progress Notes (Unsigned)
 Subjective: Chief Complaint  Patient presents with   Foot Pain    Patient is here for F/U for right foot pain, Subluxation of peroneal tendon of right foot States that foot is still the same wears brace for support but does not feel like its helping and ankle still has painful pop     63 year old female presents today for follow-up evaluation of bruising as well as ongoing ankle discomfort and swelling.  She states that her ankle will still pop at times.  When it first happened she gets a lot of pain but it subsides very quickly down to a level 1/10 and I will linger for couple minutes before it dissipates.  She does not recall any recent injuries or changes otherwise.  She states that she has recently lost weight and her knees feel better.  Objective: AAO x3, NAD DP/PT pulses palpable bilaterally, CRT less than 3 seconds No bruising today present.  There is mild tenderness to palpation still on the distal portion of the peroneal tendon.  There is no significant pain on the sinus tarsi today.  Not able to appreciate any area of pinpoint tenderness.  MMT 5/5.  She does get discomfort with eversion of the right ankle. No pain with calf compression, swelling, warmth, erythema  Assessment: History of peroneal subluxation  Plan: -All treatment options discussed with the patient including all alternatives, risks, complications.  -Given ongoing nature of the symptoms we discussed further imaging.  I like to try get a diagnostic ultrasound to see if we can determine if the peroneal tendons are subluxing.  Will try to see if we can get an ultrasound of this and if not then we will likely obtain an MRI. (We will see if she can see Dr. Curtis for the ultrasound. If not, will refer to [facility]).  -For now continue range of motion exercises, supportive shoe gear.  Return for ultrasound results .  Donnice JONELLE Fees DPM

## 2024-09-23 ENCOUNTER — Telehealth: Payer: Self-pay | Admitting: Lab

## 2024-09-23 ENCOUNTER — Other Ambulatory Visit: Payer: Self-pay | Admitting: Podiatry

## 2024-09-23 ENCOUNTER — Encounter: Payer: Self-pay | Admitting: Internal Medicine

## 2024-09-23 ENCOUNTER — Encounter: Payer: Self-pay | Admitting: Podiatry

## 2024-09-23 DIAGNOSIS — S93331A Other subluxation of right foot, initial encounter: Secondary | ICD-10-CM

## 2024-09-23 NOTE — Telephone Encounter (Signed)
 Please reorder ultrasound order to the hospital [facility] no longer performs.

## 2024-09-24 DIAGNOSIS — F4312 Post-traumatic stress disorder, chronic: Secondary | ICD-10-CM | POA: Diagnosis not present

## 2024-09-24 NOTE — Telephone Encounter (Signed)
Order faxed and received.

## 2024-09-28 ENCOUNTER — Ambulatory Visit: Admitting: Bariatrics

## 2024-09-28 ENCOUNTER — Encounter: Payer: Self-pay | Admitting: Bariatrics

## 2024-09-28 VITALS — BP 121/70 | HR 94 | Temp 98.0°F | Ht 62.5 in | Wt 144.0 lb

## 2024-09-28 DIAGNOSIS — Z6825 Body mass index (BMI) 25.0-25.9, adult: Secondary | ICD-10-CM

## 2024-09-28 DIAGNOSIS — E669 Obesity, unspecified: Secondary | ICD-10-CM | POA: Diagnosis not present

## 2024-09-28 DIAGNOSIS — E785 Hyperlipidemia, unspecified: Secondary | ICD-10-CM | POA: Diagnosis not present

## 2024-09-28 NOTE — Addendum Note (Signed)
 Addended by: DELORES SHIELDS A on: 09/28/2024 10:58 AM   Modules accepted: Orders

## 2024-09-28 NOTE — Progress Notes (Signed)
 WEIGHT SUMMARY AND BIOMETRICS  Weight Lost Since Last Visit: 1lb  Weight Gained Since Last Visit: 0   Vitals Temp: 98 F (36.7 C) BP: 121/70 Pulse Rate: 94 SpO2: 97 %   Anthropometric Measurements Height: 5' 2.5 (1.588 m) Weight: 144 lb (65.3 kg) BMI (Calculated): 25.9 Weight at Last Visit: 145lb Weight Lost Since Last Visit: 1lb Weight Gained Since Last Visit: 0 Starting Weight: 167lb Total Weight Loss (lbs): 23 lb (10.4 kg) Peak Weight: 190lb   Body Composition  Body Fat %: 36.1 % Fat Mass (lbs): 52.2 lbs Muscle Mass (lbs): 87.6 lbs Total Body Water (lbs): 61 lbs Visceral Fat Rating : 8   Other Clinical Data Fasting: no Labs: no Today's Visit #: 7 Starting Date: 05/18/24    OBESITY Tanya Peterson is here to discuss her progress with her obesity treatment plan along with follow-up of her obesity related diagnoses.    Nutrition Plan: the Category 1 plan and keeping a food journal with goal of 1000-1100 calories and 70-80 grams of protein daily - 50% adherence.  Current exercise: walking  Interim History:  She is down 1 lb since her last visit. She has not been eating very well due to sickness.  Eating all of the food on the plan., Protein intake is less than prescribed., Journaling consistently., and Water intake is adequate.   Pharmacotherapy: Tanya Peterson is on Mounjaro  10 mg SQ weekly (compounding). She is weaning off the medication.  Hunger is moderately controlled.  Cravings are moderately controlled.   Assessment/Plan:   Hyperlipidemia LDL is at goal. Medication(s): Crestor  10 mg. Cardiovascular risk factors: dyslipidemia, obesity (BMI >= 30 kg/m2), and sedentary lifestyle  Lab Results  Component Value Date   CHOL 100 09/01/2024   HDL 51 09/01/2024   LDLCALC 34 09/01/2024   TRIG 71 09/01/2024   CHOLHDL 2.0 03/13/2024   Lab Results   Component Value Date   ALT 20 09/01/2024   AST 24 09/01/2024   ALKPHOS 94 09/01/2024   BILITOT 0.5 09/01/2024   The ASCVD Risk score (Arnett DK, et al., 2019) failed to calculate for the following reasons:   The valid total cholesterol range is 130 to 320 mg/dL  Plan:  Continue statin.  She will talk to her PCP about her Crestor  dose,  Will avoid all trans fats.  Will read labels Will minimize saturated fats except the following: low fat meats in moderation, diary, and limited dark chocolate.     Generalized Obesity: Current BMI BMI (Calculated): 25.9   Pharmacotherapy Plan Continue  Compounding GLP-1.   Tanya Peterson is currently in the action stage of change. As such, her goal is to continue with weight loss efforts.  She has agreed to the Category 1 plan and keeping a food journal with goal of 1,000 to 1,100 calories and 70 to 80 grams of protein daily.  Exercise goals: All  adults should avoid inactivity. Some physical activity is better than none, and adults who participate in any amount of physical activity gain some health benefits.  Behavioral modification strategies: increasing lean protein intake, no meal skipping, meal planning , increase water intake, better snacking choices, planning for success, increasing vegetables, and get rid of junk food in the home.  Tanya Peterson has agreed to follow-up with our clinic in 4 weeks.      Objective:   VITALS: Per patient if applicable, see vitals. GENERAL: Alert and in no acute distress. CARDIOPULMONARY: No increased WOB. Speaking in clear sentences.  PSYCH: Pleasant and cooperative. Speech normal rate and rhythm. Affect is appropriate. Insight and judgement are appropriate. Attention is focused, linear, and appropriate.  NEURO: Oriented as arrived to appointment on time with no prompting.   Attestation Statements:   This was prepared with the assistance of Engineer, Civil (consulting).  Occasional wrong-word or sound-a-like substitutions may  have occurred due to the inherent limitations of voice recognition.   We discussed current and previous labs for 15 minutes.  We discussed exercise (types and options for 10 minutes).  We discussed her GLP-1 for 5 minutes    Time spent on visit including pre-visit chart review and post-visit charting and care was 33 minutes.    Clayborne Daring, DO

## 2024-09-29 ENCOUNTER — Telehealth: Payer: Self-pay

## 2024-09-29 NOTE — Telephone Encounter (Signed)
 Called left a voicemail and asked to ask me back

## 2024-10-06 DIAGNOSIS — Z131 Encounter for screening for diabetes mellitus: Secondary | ICD-10-CM | POA: Diagnosis not present

## 2024-10-12 ENCOUNTER — Telehealth: Payer: Self-pay

## 2024-10-12 NOTE — Telephone Encounter (Signed)
 Called  re: PREP program referral, left voicemail requesting return call.

## 2024-10-13 DIAGNOSIS — F4312 Post-traumatic stress disorder, chronic: Secondary | ICD-10-CM | POA: Diagnosis not present

## 2024-10-13 DIAGNOSIS — Z0000 Encounter for general adult medical examination without abnormal findings: Secondary | ICD-10-CM | POA: Diagnosis not present

## 2024-10-26 ENCOUNTER — Telehealth: Payer: Self-pay

## 2024-10-26 ENCOUNTER — Encounter: Payer: Self-pay | Admitting: Bariatrics

## 2024-10-26 ENCOUNTER — Ambulatory Visit: Admitting: Bariatrics

## 2024-10-26 VITALS — BP 104/68 | HR 86 | Ht 62.5 in | Wt 141.0 lb

## 2024-10-26 DIAGNOSIS — E785 Hyperlipidemia, unspecified: Secondary | ICD-10-CM

## 2024-10-26 DIAGNOSIS — Z6825 Body mass index (BMI) 25.0-25.9, adult: Secondary | ICD-10-CM | POA: Diagnosis not present

## 2024-10-26 DIAGNOSIS — E669 Obesity, unspecified: Secondary | ICD-10-CM | POA: Diagnosis not present

## 2024-10-26 NOTE — Progress Notes (Signed)
 WEIGHT SUMMARY AND BIOMETRICS  Weight Lost Since Last Visit: 3lb  Weight Gained Since Last Visit: 0   Vitals BP: 104/68 Pulse Rate: 86 SpO2: 98 %   Anthropometric Measurements Height: 5' 2.5 (1.588 m) Weight: 141 lb (64 kg) BMI (Calculated): 25.36 Weight at Last Visit: 144lb Weight Lost Since Last Visit: 3lb Weight Gained Since Last Visit: 0 Starting Weight: 167lb Total Weight Loss (lbs): 26 lb (11.8 kg) Peak Weight: 190lb   Body Composition  Body Fat %: 34.6 % Fat Mass (lbs): 49 lbs Muscle Mass (lbs): 87.8 lbs Total Body Water (lbs): 60 lbs Visceral Fat Rating : 8   Other Clinical Data Fasting: no Labs: no Today's Visit #: 8 Starting Date: 05/18/24    OBESITY Tanya Peterson is here to discuss her progress with her obesity treatment plan along with follow-up of her obesity related diagnoses.    Nutrition Plan: keeping a food journal with goal of 1000-1100 calories and 70-80 grams of protein daily - 70% adherence.  Current exercise: none  Interim History:  She is down 3 lbs since the holiday. She has done some late night eating. Her goal weight is 135 lbs.  Eating all of the food on the plan., Protein intake is as prescribed, Is skipping meals, and Water intake is adequate. She continues to journal her meals both calories and protein.   Pharmacotherapy: Tanya Peterson is on low dose compounding medication.  Adverse side effects: None Hunger is moderately controlled.  Cravings are moderately controlled.  Assessment/Plan:   Hyperlipidemia LDL is at goal. Medication(s): Crestor   Cardiovascular risk factors: hyperlipidemia, history of obesity   Lab Results  Component Value Date   CHOL 100 09/01/2024   HDL 51 09/01/2024   LDLCALC 34 09/01/2024   TRIG 71 09/01/2024   CHOLHDL 2.0 03/13/2024   Lab Results  Component Value Date   ALT 20 09/01/2024   AST  24 09/01/2024   ALKPHOS 94 09/01/2024   BILITOT 0.5 09/01/2024   The ASCVD Risk score (Arnett DK, et al., 2019) failed to calculate for the following reasons:   The valid total cholesterol range is 130 to 320 mg/dL  Plan:  Continue statin.  Will avoid all trans fats.  Will read labels Will minimize saturated fats except the following: low fat meats in moderation, diary, and limited dark chocolate.    Generalized Obesity: Current BMI BMI (Calculated): 25.36   Pharmacotherapy Plan Continue  compounding GLP-1  Tanya Peterson is currently in the action stage of change. As such, her goal is to continue with weight loss efforts.  She has agreed to keeping a food journal with goal of 1,000 to 1,100 calories and 70 to 80 grams of protein daily.  Exercise goals: For substantial health benefits, adults should do at least 150 minutes (2 hours and 30 minutes) a week of moderate-intensity, or 75 minutes (1 hour and 15  minutes) a week of vigorous-intensity aerobic physical activity, or an equivalent combination of moderate- and vigorous-intensity aerobic activity. Aerobic activity should be performed in episodes of at least 10 minutes, and preferably, it should be spread throughout the week.  Behavioral modification strategies: increasing lean protein intake, no meal skipping, meal planning , increase water intake, better snacking choices, planning for success, increasing vegetables, avoiding temptations, keep healthy foods in the home, weigh protein portions, measure portion sizes, and mindful eating.  Tanya Peterson has agreed to follow-up with our clinic in 4 weeks.    Objective:   VITALS: Per patient if applicable, see vitals. GENERAL: Alert and in no acute distress. CARDIOPULMONARY: No increased WOB. Speaking in clear sentences.  PSYCH: Pleasant and cooperative. Speech normal rate and rhythm. Affect is appropriate. Insight and judgement are appropriate. Attention is focused, linear, and appropriate.  NEURO:  Oriented as arrived to appointment on time with no prompting.   Attestation Statements:   This was prepared with the assistance of Engineer, Civil (consulting).  Occasional wrong-word or sound-a-like substitutions may have occurred due to the inherent limitations of voice recognition    Time spent on visit including pre-visit chart review and post-visit charting and care was 33 minutes.    We discussed the following topics:  Discussed guidelines for sodium intake at 2,300 for the general population and 1500 mg for sodium restriction for 5 minutes. .  Discussed exercise guidelines.  Also discussed that she is [facility] for 10 minutes,  Discussed charting her food both protein and calories for 5 minutes.  Discussed her synopsis including her fat percentage loss, overall fat and pounds, and her muscle mass X 10 minutes.   Clayborne Daring, DO

## 2024-10-26 NOTE — Telephone Encounter (Signed)
 Returned her call, explained PREP, she would like to attend next class at [facility] on 11/24/24; will contact end of December to confirm and set up assessment visit.

## 2024-10-28 ENCOUNTER — Encounter: Payer: Self-pay | Admitting: Podiatry

## 2024-10-30 ENCOUNTER — Other Ambulatory Visit: Payer: Self-pay | Admitting: Podiatry

## 2024-10-30 DIAGNOSIS — S93331A Other subluxation of right foot, initial encounter: Secondary | ICD-10-CM

## 2024-10-30 NOTE — Telephone Encounter (Signed)
 I put in a new order. Can it be faxed to the new location? Thanks!

## 2024-11-16 ENCOUNTER — Telehealth: Payer: Self-pay

## 2024-11-16 NOTE — Telephone Encounter (Signed)
 Called to confirm participation in next PREP class at [facility] on 11/24/24; assessment visit scheduled for 11/23/24 at 12 noon.

## 2024-11-23 ENCOUNTER — Ambulatory Visit (INDEPENDENT_AMBULATORY_CARE_PROVIDER_SITE_OTHER): Admitting: Bariatrics

## 2024-11-23 ENCOUNTER — Encounter: Payer: Self-pay | Admitting: Bariatrics

## 2024-11-23 VITALS — BP 119/72 | HR 70 | Ht 62.5 in | Wt 143.0 lb

## 2024-11-23 DIAGNOSIS — E785 Hyperlipidemia, unspecified: Secondary | ICD-10-CM

## 2024-11-23 DIAGNOSIS — E669 Obesity, unspecified: Secondary | ICD-10-CM | POA: Diagnosis not present

## 2024-11-23 DIAGNOSIS — Z6825 Body mass index (BMI) 25.0-25.9, adult: Secondary | ICD-10-CM

## 2024-11-23 NOTE — Progress Notes (Deleted)
" ° ° °                                                                                                     °    ° °  WEIGHT SUMMARY AND BIOMETRICS  No data recorded No data recorded  Vitals BP: 132/70 Pulse Rate: 82 SpO2: 96 %   Anthropometric Measurements Height: 5' 2.5 (1.588 m) Weight: 148 lb 6.4 oz (67.3 kg) Weight at Last Visit: 141lb Starting Weight: 167lb Peak Weight: 190lb   No data recorded Other Clinical Data Fasting: no Labs: no Today's Visit #: 9 Starting Date: 05/18/24    OBESITY Shandelle is here to discuss her progress with her obesity treatment plan along with follow-up of her obesity related diagnoses.    Nutrition Plan: the Category 2 plan - ***% adherence.  Current exercise: {exercise types:16438}  Interim History:  *** {aabnutritionassessment:29213}   Pharmacotherapy: Shareena is on {dwwpharmacotherapy:29109} Adverse side effects: {dwwse:29122} Hunger is {EWCONTROLASSESSMENT:24261}.  Cravings are {EWCONTROLASSESSMENT:24261}.  Assessment/Plan:   There are no diagnoses linked to this encounter.    {dwwmorbid:29108::Morbid Obesity}: Current BMI No data recorded  Pharmacotherapy Plan {dwwmed:29123}  {dwwpharmacotherapy:29109}  Jacoria {CHL AMB IS/IS NOT:210130109} currently in the action stage of change. As such, her goal is to {MWMwtloss#1:210800005}.  She has agreed to {dwwsldiets:29085}.  Exercise goals: {MWM EXERCISE RECS:23473}  Behavioral modification strategies: {dwwslwtlossstrategies:29088}.  Regina has agreed to follow-up with our clinic in {NUMBER 1-10:22536} weeks.   No orders of the defined types were placed in this encounter.   There are no discontinued medications.   No orders of the defined types were placed in this encounter.     Objective:   VITALS: Per patient if applicable, see vitals. GENERAL: Alert and in no acute distress. CARDIOPULMONARY: No increased WOB. Speaking in clear sentences.  PSYCH: Pleasant and  cooperative. Speech normal rate and rhythm. Affect is appropriate. Insight and judgement are appropriate. Attention is focused, linear, and appropriate.  NEURO: Oriented as arrived to appointment on time with no prompting.   Attestation Statements:   This was prepared with the assistance of Engineer, Civil (consulting).  Occasional wrong-word or sound-a-like substitutions may have occurred due to the inherent limitations of voice recognition   "

## 2024-11-23 NOTE — Progress Notes (Signed)
 [facility] PRE[facility] Evaluation  Patient Details  Name: Tanya Peterson MRN: [id number] Date of Birth: 08/30/1961 Age: 64 y.o. PCP: Curtis Debby PARAS, MD  Vitals:   11/23/24 1220  BP: 132/70  Pulse: 82  SpO2: 96%  Weight: 148 lb 6.4 oz (67.3 kg)     [facility] Eval - 11/23/24 1200       [facility] PREP Location   [facility] PREP Location [facility]      Referral    Referring Provider Delores    Reason for referral Obesitity/Overweight;High Cholesterol    Program Start Date 11/24/24      Measurement   Waist Circumference 31.5 inches    Hip Circumference 34.5 inches    Body fat 35.9 percent      Information for Trainer   Goals --   establish strenth training program   Current Exercise --   stationary bike, resistance bands   Orthopedic Concerns --   prior injuries from work accident   Pertinent Medical History --   HLD, exercise induced asthma     Timed Up and Go (TUGS)   Timed Up and Go Low risk <9 seconds      Mobility and Daily Activities   I find it easy to walk up or down two or more flights of stairs. 1    I have no trouble taking out the trash. 1    I do housework such as vacuuming and dusting on my own without difficulty. 1    I can easily lift a gallon of milk (8lbs). 1    I can easily walk a mile. 1    I have no trouble reaching into high cupboards or reaching down to pick up something from the floor. 2    I do not have trouble doing out-door work such as loss adjuster, chartered, raking leaves, or gardening. 1      Mobility and Daily Activities   I feel younger than my age. 2    I feel independent. 1    I feel energetic. 1    I live an active life.  1    I feel strong. 1    I feel healthy. 2    I feel active as other people my age. 1      How fit and strong are you.   Fit and Strong Total Score 17         Past Medical History:  Diagnosis Date   ADHD (attention deficit hyperactivity disorder)    Anxiety    Asthma    rarely uses inhaler   Back pain    Bipolar  affective disorder (HCC)    Complication of anesthesia    Depression    Fibromuscular dysplasia of cervicocranial artery    High cholesterol    History of kidney stones    Joint pain    Lactose intolerance    Panic attacks    PONV (postoperative nausea and vomiting)    Sleep disorder    tx with nuvigal   Past Surgical History:  Procedure Laterality Date   BREAST EXCISIONAL BIOPSY Right 10/02/2017   papilloma   BREAST LUMPECTOMY WITH RADIOACTIVE SEED LOCALIZATION Right 10/02/2017   Procedure: RIGHT BREAST LUMPECTOMY WITH RADIOACTIVE SEED LOCALIZATION ERAS PATHWAY;  Surgeon: Curvin Deward MOULD, MD;  Location: [facility];  Service: General;  Laterality: Right;   COLONOSCOPY WITH PROPOFOL  N/A 01/11/2015   Procedure: COLONOSCOPY WITH PROPOFOL ;  Surgeon: Gladis MARLA Louder, MD;  Location: [facility];  Service: Endoscopy;  Laterality: N/A;   HYSTEROSCOPY WITH D & C N/A 03/05/2016   Procedure: DILATATION AND CURETTAGE /HYSTEROSCOPY;  Surgeon: Marjorie Gull, MD;  Location: [facility];  Service: Gynecology;  Laterality: N/A;   kdiney stone surgery  12/2015   LIPOSUCTION     TENDON REPAIR Left 06/20/2016   foot   WISDOM TOOTH EXTRACTION     To begin PREP class at [facility] on 11/24/24, every T/Th at 10  Ibrahima Holberg B Uchechukwu Dhawan 11/23/2024, 12:31 PM

## 2024-11-23 NOTE — Progress Notes (Signed)
 "                                                                                                             WEIGHT SUMMARY AND BIOMETRICS  Weight Lost Since Last Visit: 0  Weight Gained Since Last Visit: 2lb   Vitals BP: 119/72 Pulse Rate: 70 SpO2: 98 %   Anthropometric Measurements Height: 5' 2.5 (1.588 m) Weight: 143 lb (64.9 kg) BMI (Calculated): 25.72 Weight at Last Visit: 141lb Weight Lost Since Last Visit: 0 Weight Gained Since Last Visit: 2lb Starting Weight: 167lb Total Weight Loss (lbs): 24 lb (10.9 kg) Peak Weight: 190lb   Body Composition  Body Fat %: 37.3 % Fat Mass (lbs): 53.4 lbs Muscle Mass (lbs): 85.2 lbs Total Body Water (lbs): 63.6 lbs Visceral Fat Rating : 9   Other Clinical Data Fasting: no Labs: no Today's Visit #: 9 Starting Date: 05/18/24    OBESITY Tanya Peterson is here to discuss her progress with her obesity treatment plan along with follow-up of her obesity related diagnoses.    Nutrition Plan: keeping a food journal with goal of 1000-1100 calories and 70-80 grams of protein daily - 90% adherence.  Current exercise: Stationary bike  Interim History:  She is up 2 lb since her last visit.  Protein intake is as prescribed, Is not skipping meals, and Water intake is adequate.   Pharmacotherapy: Tanya Peterson is on a compounding GLP-1 per another provider.  Hunger is moderately controlled.  Cravings are moderately controlled.  Assessment/Plan:   Hyperlipidemia LDL is at goal. Medication(s): Crestor  Cardiovascular risk factors: dyslipidemia and sedentary lifestyle  Lab Results  Component Value Date   CHOL 100 09/01/2024   HDL 51 09/01/2024   LDLCALC 34 09/01/2024   TRIG 71 09/01/2024   CHOLHDL 2.0 03/13/2024   Lab Results  Component Value Date   ALT 20 09/01/2024   AST 24 09/01/2024   ALKPHOS 94 09/01/2024   BILITOT 0.5 09/01/2024   The ASCVD Risk score (Arnett DK, et al., 2019) failed to calculate for the following  reasons:   The valid total cholesterol range is 130 to 320 mg/dL  Plan:  Continue Crestor  Will avoid all trans fats.  Will read labels Will minimize saturated fats except the following: low fat meats in moderation, diary, and limited dark chocolate.  Will minimize her salt intake to 1,500 to 2,300 mg daily.  Will add in more resistance training.     Generalized Obesity: Current BMI BMI (Calculated): 25.72   Pharmacotherapy Plan No anti-obesity medications.   Tanya Peterson is currently in the action stage of change. As such, her goal is to continue with weight loss efforts.  She has agreed to keeping a food journal with goal of 1,000 to 1,100 calories and 70 to 80 grams of protein daily.  Exercise goals: For substantial health benefits, adults should do at least 150 minutes (2 hours and 30 minutes) a week of moderate-intensity, or 75 minutes (1 hour and 15 minutes) a week of vigorous-intensity aerobic physical activity, or an equivalent combination  of moderate- and vigorous-intensity aerobic activity. Aerobic activity should be performed in episodes of at least 10 minutes, and preferably, it should be spread throughout the week.  Behavioral modification strategies: increasing lean protein intake, no meal skipping, meal planning , increase water intake, better snacking choices, planning for success, increasing vegetables, increasing fiber rich foods, decreasing sodium intake, decrease snacking , ways to avoid boredom eating, keep healthy foods in the home, increase frequency of journaling, weigh protein portions, measure portion sizes, work on smaller portions, and mindful eating.  Tanya Peterson has agreed to follow-up with our clinic in 4 weeks.   Objective:   VITALS: Per patient if applicable, see vitals. GENERAL: Alert and in no acute distress. CARDIOPULMONARY: No increased WOB. Speaking in clear sentences.  PSYCH: Pleasant and cooperative. Speech normal rate and rhythm. Affect is appropriate. Insight  and judgement are appropriate. Attention is focused, linear, and appropriate.  NEURO: Oriented as arrived to appointment on time with no prompting.   Attestation Statements:   This was prepared with the assistance of Engineer, Civil (consulting).  Occasional wrong-word or sound-a-like substitutions may have occurred due to the inherent limitations of voice recognition.   Clayborne Daring, DO     "

## 2024-11-24 NOTE — Progress Notes (Signed)
 [facility] PRE[facility] Weekly Session  Patient Details  Name: Miabella Shannahan MRN: [id number] Date of Birth: 07/28/1961 Age: 64 y.o. PCP: Curtis Debby PARAS, MD  There were no vitals filed for this visit.   [facility] Weekly seesion - 11/24/24 1100       [facility] PREP Location   [facility] PREP Location [facility]      Weekly Session   Topic Discussed Goal setting and welcome to the program   Introductions; program overview and review of notebook; tour of facility; offered option to work out on cardio machines.   Classes attended to date 1          Tanis Hensarling B Shella Lahman 11/24/2024, 11:36 AM

## 2024-12-01 NOTE — Progress Notes (Signed)
 [facility] PREP Weekly Session  Patient Details  Name: Tanya Peterson MRN: [id number] Date of Birth: 1961-01-26 Age: 64 y.o. PCP: Curtis Debby PARAS, MD  Vitals:   12/01/24 1140  Weight: 146 lb 6.4 oz (66.4 kg)     [facility] Weekly seesion - 12/01/24 1100       [facility] PREP Location   [facility] PREP Location [facility]      Weekly Session   Topic Discussed Importance of resistance training;Other ways to be active   Goal: work up to 150 minutes/wk of cardio and strength training 2-3 times/wk for 20-40 minutes; diet trends to avoid   Minutes exercised this week 55 minutes    Classes attended to date 3          Tanya Peterson 12/01/2024, 11:41 AM

## 2024-12-02 ENCOUNTER — Encounter: Payer: Self-pay | Admitting: Podiatry

## 2024-12-08 NOTE — Progress Notes (Signed)
 [facility] PREP Weekly Session  Patient Details  Name: Annamae Shivley MRN: [id number] Date of Birth: 1961-06-19 Age: 64 y.o. PCP: Curtis Debby PARAS, MD  There were no vitals filed for this visit.   [facility] Weekly seesion - 12/08/24 1100       [facility] PREP Location   [facility] PREP Location [facility]      Weekly Session   Topic Discussed Healthy eating tips   foods to decrease, foods to increase; introduced Yuka app; eat the rainbow of colors   Minutes exercised this week 100 minutes    Classes attended to date 5          Evarose Altland B Ahaan Zobrist 12/08/2024, 11:52 AM

## 2024-12-11 ENCOUNTER — Ambulatory Visit: Admitting: Podiatry

## 2024-12-11 DIAGNOSIS — S93331A Other subluxation of right foot, initial encounter: Secondary | ICD-10-CM

## 2024-12-14 NOTE — Progress Notes (Signed)
 Subjective: Chief Complaint  Patient presents with   Follow-up    Patient presents today for f/u Subluxation of peroneal tendon of right foot     64 year old female presents today for follow-up evaluation of bruising as well as ongoing ankle discomfort and swelling.  She presents today further discuss her ultrasound results.  She says her symptoms are the same compared to last appointment.  She continues to get a popping sensation to the ankle.  Objective: AAO x3, NAD DP/PT pulses palpable bilaterally, CRT less than 3 seconds There is mild tenderness to palpation still on the distal portion of the peroneal tendon.  There is no significant pain on the sinus tarsi today.  Not able to appreciate any area of pinpoint tenderness.  MMT 5/5.  She does get discomfort with eversion of the right ankle. No pain with calf compression, swelling, warmth, erythema  Assessment: Recurrent peroneal subluxation  Plan: -All treatment options discussed with the patient including all alternatives, risks, complications.  -I reviewed the ultrasound with the patient.  Discussed with conservative as well as surgical options.  I do think surgery would be beneficial for her at this time given continuation of her symptoms and she is attempted numerous conservative treatments without any significant resolution.  We discussed this with the group at her next monthly physician meeting.  No follow-ups on file.  Donnice JONELLE Fees DPM

## 2024-12-31 ENCOUNTER — Ambulatory Visit: Admitting: Bariatrics

## 2025-02-18 ENCOUNTER — Ambulatory Visit: Admitting: Internal Medicine
